# Patient Record
Sex: Female | Born: 1955 | Race: Black or African American | Hispanic: No | Marital: Single | Smoking: Never smoker
Health system: Southern US, Community
[De-identification: ages and names within clinical notes are randomized; demographics above are authoritative.]

## PROBLEM LIST (undated history)

## (undated) DIAGNOSIS — K219 Gastro-esophageal reflux disease without esophagitis: Secondary | ICD-10-CM

## (undated) DIAGNOSIS — F32A Depression, unspecified: Secondary | ICD-10-CM

## (undated) DIAGNOSIS — F329 Major depressive disorder, single episode, unspecified: Secondary | ICD-10-CM

## (undated) DIAGNOSIS — I10 Essential (primary) hypertension: Secondary | ICD-10-CM

## (undated) DIAGNOSIS — Z9889 Other specified postprocedural states: Secondary | ICD-10-CM

## (undated) DIAGNOSIS — M1990 Unspecified osteoarthritis, unspecified site: Secondary | ICD-10-CM

## (undated) DIAGNOSIS — G4730 Sleep apnea, unspecified: Secondary | ICD-10-CM

## (undated) DIAGNOSIS — T7840XA Allergy, unspecified, initial encounter: Secondary | ICD-10-CM

## (undated) DIAGNOSIS — E119 Type 2 diabetes mellitus without complications: Secondary | ICD-10-CM

## (undated) DIAGNOSIS — F419 Anxiety disorder, unspecified: Secondary | ICD-10-CM

## (undated) DIAGNOSIS — G709 Myoneural disorder, unspecified: Secondary | ICD-10-CM

## (undated) DIAGNOSIS — F429 Obsessive-compulsive disorder, unspecified: Secondary | ICD-10-CM

## (undated) DIAGNOSIS — E785 Hyperlipidemia, unspecified: Secondary | ICD-10-CM

## (undated) HISTORY — PX: ORIF ANKLE FRACTURE: SUR919

## (undated) HISTORY — PX: FRACTURE SURGERY: SHX138

## (undated) HISTORY — DX: Major depressive disorder, single episode, unspecified: F32.9

## (undated) HISTORY — DX: Sleep apnea, unspecified: G47.30

## (undated) HISTORY — DX: Myoneural disorder, unspecified: G70.9

## (undated) HISTORY — DX: Allergy, unspecified, initial encounter: T78.40XA

## (undated) HISTORY — DX: Unspecified osteoarthritis, unspecified site: M19.90

## (undated) HISTORY — DX: Hyperlipidemia, unspecified: E78.5

## (undated) HISTORY — DX: Gastro-esophageal reflux disease without esophagitis: K21.9

## (undated) HISTORY — PX: SPINE SURGERY: SHX786

## (undated) HISTORY — DX: Other specified postprocedural states: Z98.890

## (undated) HISTORY — DX: Anxiety disorder, unspecified: F41.9

## (undated) HISTORY — PX: JOINT REPLACEMENT: SHX530

## (undated) HISTORY — DX: Depression, unspecified: F32.A

## (undated) HISTORY — PX: LUMBAR DISC SURGERY: SHX700

---
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
## 2015-06-26 DIAGNOSIS — R1319 Other dysphagia: Secondary | ICD-10-CM

## 2016-06-11 ENCOUNTER — Encounter (HOSPITAL_COMMUNITY): Payer: Self-pay | Admitting: Emergency Medicine

## 2016-06-11 ENCOUNTER — Emergency Department (HOSPITAL_COMMUNITY)
Admission: EM | Admit: 2016-06-11 | Discharge: 2016-06-11 | Disposition: A | Discharge status: Home / Self Care | Payer: Self-pay | Attending: Emergency Medicine | Admitting: Emergency Medicine

## 2016-06-11 DIAGNOSIS — I10 Essential (primary) hypertension: Secondary | ICD-10-CM | POA: Insufficient documentation

## 2016-06-11 DIAGNOSIS — M5441 Lumbago with sciatica, right side: Secondary | ICD-10-CM

## 2016-06-11 HISTORY — DX: Essential (primary) hypertension: I10

## 2016-06-11 MED ORDER — OXYCODONE-ACETAMINOPHEN 5-325 MG PO TABS
1.0000 | ORAL_TABLET | Freq: Once | ORAL | Status: AC
Start: 2016-06-11 — End: 2016-06-11
  Administered 2016-06-11: 1 via ORAL
  Filled 2016-06-11: qty 1

## 2016-06-11 MED ORDER — METHYLPREDNISOLONE 4 MG PO TBPK: ORAL_TABLET | ORAL | 0 refills | Status: DC

## 2016-06-11 MED ORDER — METHOCARBAMOL 500 MG PO TABS: 500.0000 mg | ORAL_TABLET | Freq: Two times a day (BID) | ORAL | 0 refills | Status: DC

## 2016-06-11 NOTE — ED Provider Notes (Signed)
[facility]-EMERGENCY DEPT Provider Note   CSN: [id number] Arrival date & time: 06/11/16  1935     History   Chief Complaint Chief Complaint  Patient presents with  . Leg Pain    HPI Christine Hopkins is a 60 y.o. female.  HPI  60 y.o. female with a hx of HTN, presents to the Emergency Department today complaining of right leg pain intermittently x 2 days. States being on new mattress recently due to moving from [place]. Pain occurs after getting out of bed. Notes pain is intermittent. Worse with ambulation. Improved with rest. States pain is 8/10. No hx IV drug use. No loss of bowel or bladder function. No fevers. Minimal relief of pain with ibuprofen. No trauma to area noted. No falls. No other symptoms noted.    Past Medical History:  Diagnosis Date  . Hypertension     There are no active problems to display for this patient.   No past surgical history on file.  OB History    No data available       Home Medications    Prior to Admission medications   Not on File    Family History No family history on file.  Social History Social History  Substance Use Topics  . Smoking status: Not on file  . Smokeless tobacco: Not on file  . Alcohol use Not on file     Allergies   Patient has no known allergies.   Review of Systems Review of Systems  Constitutional: Negative for fever.  Respiratory: Negative for shortness of breath.   Gastrointestinal: Negative for nausea and vomiting.  Genitourinary: Negative for dysuria.  Musculoskeletal: Positive for back pain.  Skin: Negative for wound.  Neurological: Negative for numbness.   Physical Exam Updated Vital Signs BP 136/87 (BP Location: Left Arm)   Pulse 77   Temp 97.8 F (36.6 C)   Resp 16   SpO2 97%   Physical Exam  Constitutional: She is oriented to person, place, and time. Vital signs are normal. She appears well-developed and well-nourished.  HENT:  Head: Normocephalic and atraumatic.  Right Ear:  Hearing normal.  Left Ear: Hearing normal.  Eyes: Conjunctivae and EOM are normal. Pupils are equal, round, and reactive to light.  Neck: Normal range of motion. Neck supple.  Cardiovascular: Normal rate, regular rhythm, normal heart sounds and intact distal pulses.   Pulmonary/Chest: Effort normal and breath sounds normal.  Musculoskeletal: Normal range of motion.  TTP lumbar spine. No palpable or visible deformities. Positive straight leg raise on right. NVI. Motor/sensation intact.   Neurological: She is alert and oriented to person, place, and time.  Skin: Skin is warm and dry.  Psychiatric: She has a normal mood and affect. Her speech is normal and behavior is normal. Thought content normal.  Nursing note and vitals reviewed.  ED Treatments / Results  Labs (all labs ordered are listed, but only abnormal results are displayed) Labs Reviewed - No data to display  EKG  EKG Interpretation None       Radiology No results found.  Procedures Procedures (including critical care time)  Medications Ordered in ED Medications - No data to display   Initial Impression / Assessment and Plan / ED Course  I have reviewed the triage vital signs and the nursing notes.  Pertinent labs & imaging results that were available during my care of the patient were reviewed by me and considered in my medical decision making (see chart for details).  Clinical  Course    Final Clinical Impressions(s) / ED Diagnoses     {I have reviewed the relevant previous healthcare records.  {I obtained HPI from historian. {Patient discussed with supervising physician.  ED Course:  Assessment: Patient is a 60yF who presents to the ED with back pain x 2 days. Noted sleeping on new mattress. No neurological deficits appreciated. Patient is ambulatory. No warning symptoms of back pain including: fecal incontinence, urinary retention or overflow incontinence, night sweats, waking from sleep with back pain,  unexplained fevers or weight loss, h/o cancer, IVDU, recent trauma. No concern for cauda equina, epidural abscess, or other serious cause of back pain. Likely sciatic pain as pain reproducible on straight leg raise . LUmbar spine TTP. Conservative measures such as rest, ice/heat and pain medicine indicated with PCP follow-up if no improvement with conservative management. Given Rx medrol and Robaxin.   Disposition/Plan:  DC Home Additional Verbal discharge instructions given and discussed with patient.  Pt Instructed to f/u with PCP in the next week for evaluation and treatment of symptoms. Return precautions given Pt acknowledges and agrees with plan  Supervising Physician Lavera Guiseana Duo Liu, MD  Final diagnoses:  Acute right-sided low back pain with right-sided sciatica    New Prescriptions New Prescriptions   No medications on file     Audry Piliyler Kjirsten Bloodgood, PA-C 06/11/16 2042    Lavera Guiseana Duo Liu, MD 06/12/16 [phone]

## 2016-06-11 NOTE — Discharge Instructions (Signed)
Please read and follow all provided instructions.  Your diagnoses today include:  1. Acute right-sided low back pain with right-sided sciatica     Tests performed today include: Vital signs - see below for your results today  Medications prescribed:   Take any prescribed medications only as directed.  Home care instructions:  Follow any educational materials contained in this packet Please rest, use ice or heat on your back for the next several days Do not lift, push, pull anything more than 10 pounds for the next week  Follow-up instructions: Please follow-up with your primary care provider in the next 1 week for further evaluation of your symptoms.   Return instructions:  SEEK IMMEDIATE MEDICAL ATTENTION IF YOU HAVE: New numbness, tingling, weakness, or problem with the use of your arms or legs Severe back pain not relieved with medications Loss control of your bowels or bladder Increasing pain in any areas of the body (such as chest or abdominal pain) Shortness of breath, dizziness, or fainting.  Worsening nausea (feeling sick to your stomach), vomiting, fever, or sweats Any other emergent concerns regarding your health   Additional Information:  Your vital signs today were: BP 136/87 (BP Location: Left Arm)    Pulse 77    Temp 97.8 F (36.6 C)    Resp 16    SpO2 97%  If your blood pressure (BP) was elevated above 135/85 this visit, please have this repeated by your doctor within one month. --------------

## 2016-06-11 NOTE — ED Triage Notes (Signed)
Per EMS, patient reports intermittent right thigh pain x2 days. Ambulatory with EMS. Pain radiates to right hip and worsens with movement.

## 2016-06-20 ENCOUNTER — Emergency Department (HOSPITAL_COMMUNITY): Payer: BLUE CROSS/BLUE SHIELD

## 2016-06-20 ENCOUNTER — Encounter (HOSPITAL_COMMUNITY): Payer: Self-pay

## 2016-06-20 ENCOUNTER — Emergency Department (HOSPITAL_COMMUNITY)
Admission: EM | Admit: 2016-06-20 | Discharge: 2016-06-21 | Disposition: A | Discharge status: Home / Self Care | Payer: BLUE CROSS/BLUE SHIELD | Attending: Emergency Medicine | Admitting: Emergency Medicine

## 2016-06-20 DIAGNOSIS — M549 Dorsalgia, unspecified: Secondary | ICD-10-CM | POA: Diagnosis present

## 2016-06-20 DIAGNOSIS — M5441 Lumbago with sciatica, right side: Secondary | ICD-10-CM | POA: Diagnosis not present

## 2016-06-20 DIAGNOSIS — I10 Essential (primary) hypertension: Secondary | ICD-10-CM | POA: Insufficient documentation

## 2016-06-20 MED ORDER — HYDROCODONE-ACETAMINOPHEN 5-325 MG PO TABS
1.0000 | ORAL_TABLET | Freq: Once | ORAL | Status: AC
  Administered 2016-06-20: 1 via ORAL
  Filled 2016-06-20: qty 1

## 2016-06-20 MED ORDER — IBUPROFEN 800 MG PO TABS: 800.0000 mg | ORAL_TABLET | Freq: Three times a day (TID) | ORAL | 0 refills | Status: DC

## 2016-06-20 MED ORDER — HYDROCODONE-ACETAMINOPHEN 5-325 MG PO TABS: 1.0000 | ORAL_TABLET | Freq: Four times a day (QID) | ORAL | 0 refills | Status: DC | PRN

## 2016-06-20 NOTE — ED Notes (Signed)
Pt getting disc copies of XR images.

## 2016-06-20 NOTE — Discharge Instructions (Signed)
Take anti-inflammatory (Ibuprofen and Steroid) medicine for the next week. Take this medicine with food. Take muscle relaxer at bedtime to help you sleep. This medicine makes you drowsy so do not take before driving or work Take pain medicine as needed for severe pain Use a heating pad for sore muscles - use for 20 minutes several times a day

## 2016-06-20 NOTE — ED Notes (Addendum)
Pt ambulated to XR.

## 2016-06-20 NOTE — ED Triage Notes (Signed)
Pt continues to complains of back pain, she was dx with sciatica about two weeks ago and she's out of her medications that she was given Pt has an appt with an Orthopedic on Thursday
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
## 2016-06-23 ENCOUNTER — Other Ambulatory Visit: Payer: Self-pay | Admitting: Orthopedic Surgery

## 2016-06-23 DIAGNOSIS — M545 Low back pain: Secondary | ICD-10-CM

## 2016-06-30 ENCOUNTER — Ambulatory Visit
Admission: RE | Admit: 2016-06-30 | Discharge: 2016-06-30 | Disposition: A | Discharge status: Home / Self Care | Payer: BLUE CROSS/BLUE SHIELD | Source: Ambulatory Visit | Attending: Orthopedic Surgery | Admitting: Orthopedic Surgery

## 2016-06-30 DIAGNOSIS — M545 Low back pain: Secondary | ICD-10-CM

## 2016-07-04 ENCOUNTER — Other Ambulatory Visit: Payer: Self-pay

## 2016-07-13 ENCOUNTER — Telehealth (HOSPITAL_COMMUNITY): Payer: Self-pay

## 2016-07-13 NOTE — Telephone Encounter (Signed)
Attempted to call pt back / L/M on v/mail to call [phone]

## 2016-07-15 ENCOUNTER — Ambulatory Visit (INDEPENDENT_AMBULATORY_CARE_PROVIDER_SITE_OTHER): Payer: BLUE CROSS/BLUE SHIELD | Admitting: Family Medicine

## 2016-07-15 VITALS — BP 138/78 | Temp 98.4°F | Ht 64.0 in | Wt 210.6 lb

## 2016-07-15 DIAGNOSIS — F4321 Adjustment disorder with depressed mood: Secondary | ICD-10-CM | POA: Diagnosis not present

## 2016-07-15 DIAGNOSIS — E6609 Other obesity due to excess calories: Secondary | ICD-10-CM

## 2016-07-15 DIAGNOSIS — E785 Hyperlipidemia, unspecified: Secondary | ICD-10-CM | POA: Insufficient documentation

## 2016-07-15 DIAGNOSIS — Z6836 Body mass index (BMI) 36.0-36.9, adult: Secondary | ICD-10-CM

## 2016-07-15 DIAGNOSIS — M5126 Other intervertebral disc displacement, lumbar region: Secondary | ICD-10-CM

## 2016-07-15 DIAGNOSIS — I10 Essential (primary) hypertension: Secondary | ICD-10-CM

## 2016-07-15 DIAGNOSIS — IMO0001 Reserved for inherently not codable concepts without codable children: Secondary | ICD-10-CM

## 2016-07-15 LAB — POCT URINALYSIS DIP (MANUAL ENTRY)
BILIRUBIN UA: NEGATIVE
Bilirubin, UA: NEGATIVE
GLUCOSE UA: NEGATIVE
Leukocytes, UA: NEGATIVE
Nitrite, UA: NEGATIVE
Protein Ur, POC: NEGATIVE
SPEC GRAV UA: 1.015
Urobilinogen, UA: 0.2
pH, UA: 5.5

## 2016-07-15 LAB — POC MICROSCOPIC URINALYSIS (UMFC): MUCUS RE: ABSENT

## 2016-07-15 MED ORDER — SIMVASTATIN 40 MG PO TABS: 40.0000 mg | ORAL_TABLET | Freq: Every day | ORAL | 3 refills | Status: DC

## 2016-07-15 MED ORDER — SERTRALINE HCL 100 MG PO TABS: 100.0000 mg | ORAL_TABLET | Freq: Every day | ORAL | 3 refills | Status: DC

## 2016-07-15 MED ORDER — LISINOPRIL-HYDROCHLOROTHIAZIDE 10-12.5 MG PO TABS: 1.0000 | ORAL_TABLET | Freq: Every day | ORAL | 3 refills | Status: DC

## 2016-07-15 NOTE — Patient Instructions (Addendum)
IF you received an x-ray today, you will receive an invoice from [facility]. Please contact [facility] at [phone] with questions or concerns regarding your invoice.   IF you received labwork today, you will receive an invoice from [facility]. Please contact [facility] at [phone] with questions or concerns regarding your invoice.   Our billing staff will not be able to assist you with questions regarding bills from these companies.  You will be contacted with the lab results as soon as they are available. The fastest way to get your results is to activate your My Chart account. Instructions are located on the last page of this paperwork. If you have not heard from us regarding the results in 2 weeks, please contact this office.    Degenerative Disk Disease Introduction Degenerative disk disease is a condition caused by the changes that occur in spinal disks as you grow older. Spinal disks are soft and compressible disks located between the bones of your spine (vertebrae). These disks act like shock absorbers. Degenerative disk disease can affect the whole spine. However, the neck and lower back are most commonly affected. Many changes can occur in the spinal disks with aging, such as:  The spinal disks may dry and shrink.  Small tears may occur in the tough, outer covering of the disk (annulus).  The disk space may become smaller due to loss of water.  Abnormal growths in the bone (spurs) may occur. This can put pressure on the nerve roots exiting the spinal canal, causing pain.  The spinal canal may become narrowed. What increases the risk?  Being overweight.  Having a family history of degenerative disk disease.  Smoking.  There is increased risk if you are doing heavy lifting or have a sudden injury. What are the signs or symptoms? Symptoms vary from person to person and may include:  Pain that varies in intensity. Some people have no pain, while  others have severe pain. The location of the pain depends on the part of your backbone that is affected.  You will have neck or arm pain if a disk in the neck area is affected.  You will have pain in your back, buttocks, or legs if a disk in the lower back is affected.  Pain that becomes worse while bending, reaching up, or with twisting movements.  Pain that may start gradually and then get worse as time passes. It may also start after a major or minor injury.  Numbness or tingling in the arms or legs. How is this diagnosed? Your health care provider will ask you about your symptoms and about activities or habits that may cause the pain. He or she may also ask about any injuries, diseases, or treatments you have had. Your health care provider will examine you to check for the range of movement that is possible in the affected area, to check for strength in your extremities, and to check for sensation in the areas of the arms and legs supplied by different nerve roots. You may also have:  An X-ray of the spine.  Other imaging tests, such as MRI. How is this treated? Your health care provider will advise you on the best plan for treatment. Treatment may include:  Medicines.  Rehabilitation exercises. Follow these instructions at home:  Follow proper lifting and walking techniques as advised by your health care provider.  Maintain good posture.  Exercise regularly as advised by your health care provider.  Perform relaxation exercises.  Change your sitting, standing, and sleeping habits as advised by your health care provider.  Change positions frequently.  Lose weight or maintain a healthy weight as advised by your health care provider.  Do not use any tobacco products, including cigarettes, chewing tobacco, or electronic cigarettes. If you need help quitting, ask your health care provider.  Wear supportive footwear.  Take medicines only as directed by your health care  provider. Contact a health care provider if:  Your pain does not go away within 1-4 weeks.  You have significant appetite or weight loss. Get help right away if:  Your pain is severe.  You notice weakness in your arms, hands, or legs.  You begin to lose control of your bladder or bowel movements.  You have fevers or night sweats. This information is not intended to replace advice given to you by your health care provider. Make sure you discuss any questions you have with your health care provider. Document Released: 04/03/2007 Document Revised: 11/12/2015 Document Reviewed: 10/08/2013  2017 Elsevier  Heart Disease Prevention Heart disease is a leading cause of death. There are many things you can do to help prevent heart disease. Be physically active Physical activity is good for your heart. It helps control your blood pressure, cholesterol levels, and weight. Try to be physically active every day. Ask your health care provider what activities are best for you. Be a healthy weight Extra weight can strain your heart and affect your blood pressure and cholesterol levels. Lose weight with diet and exercise if recommended by your health care provider. Eat heart-healthy foods Follow a healthy eating plan as recommended by your health care provider or dietitian. Heart-healthy foods include:  High-fiber foods. These include oat bran, oatmeal, and whole-grain breads and cereals.  Fruits and vegetables. Avoid:  Alcohol.  Fried foods.  Foods high in saturated fat. These include meats, butter, whole dairy products, shortening, and coconut or palm oil.  Salty foods. These include canned food, luncheon meat, salty snacks, and fast food. Keep your cholesterol levels under control Cholesterol is a substance that is used for many important functions. When your cholesterol levels are high, cholesterol can stick to the insides of your blood vessels, making them narrow or clog. This can lead  to chest pain (angina) and a heart attack. Keep your cholesterol levels under control as recommended by your health care provider. Have your cholesterol checked at least once a year. Target cholesterol levels (in mg/dL) for most people are:  Total cholesterol below 200.  LDL cholesterol below 100.  HDL cholesterol above 40 in men and above 50 in women.  Triglycerides below 150. Keep your blood pressure under control Having high blood pressure (hypertension) puts you at risk for stroke and other forms of heart disease. Keep your blood pressure under control as recommended by your health care provider. Ask your health care provider if you need treatment to lower your blood pressure. If you are 41-8 years of age, have your blood pressure checked every 3-5 years. If you are 79 years of age or older, have your blood pressure checked every year. Do not use tobacco products Tobacco smoke can damage your heart and blood vessels. Do not use any tobacco products including cigarettes, chewing tobacco, or electronic cigarettes. If you need help quitting, ask your health care provider. Take medicines as directed Take medicines only as directed by your health care provider. Ask your health care provider whether you should take an aspirin every day. Taking aspirin  can help reduce your risk of heart disease and stroke. Where to find more information: To find out more about heart disease, visit the American Heart Association's website at www.americanheart.org This information is not intended to replace advice given to you by your health care provider. Make sure you discuss any questions you have with your health care provider. Document Released: 01/19/2004 Document Revised: 11/04/2015 Document Reviewed: 07/31/2013 Elsevier Interactive Patient Education  2017 [employer].

## 2016-07-15 NOTE — Progress Notes (Signed)
Chief Complaint  Patient presents with  . Establish Care    HPI   Pt reports that she is here to establish care from [place].  She would like refills for her chronic meds  Hypertension: Patient here for follow-up of elevated blood pressure. She is exercising and is adherent to low salt diet.  Blood pressure is well controlled at home. Cardiac symptoms none. Patient denies chest pain, chest pressure/discomfort, claudication, exertional chest pressure/discomfort, irregular heart beat and lower extremity edema.  Cardiovascular risk factors: dyslipidemia, hypertension and obesity (BMI >= 30 kg/m2). Use of agents associated with hypertension: NSAIDS. History of target organ damage: none. BP Readings from Last 3 Encounters:  07/15/16 138/78  06/20/16 157/87  06/11/16 136/87   Ruptured Lumbar Disc She is being set up for dissectomy She reports that she will not need surgical clearance from the PCP She reports that she has chronic back pain and leg numbness since 06/09/16 She was seen in the ER for this and followed up with Orthopedics and is not going to a Neurosurgeon.  Hyperlipidemia: Patient presents with hyperlipidemia.  She was tested because OF SCREENING. negative. There is not a family history of hyperlipidemia. There is not a family history of early ischemia heart disease.  She takes simvastatin 40mg  daily.   Depression Pt reports that she has a history of depression that is aggravated by her back pain. She states that she plans to see a Psychologist, forensicsychologist and Psychiatrist.  She has her appointment prior to her surgery.    Past Medical History:  Diagnosis Date  . Anxiety   . Arthritis   . Depression   . GERD (gastroesophageal reflux disease)   . Hyperlipidemia   . Hypertension   . Neuromuscular disorder [facility](HCC)     Current Outpatient Prescriptions  Medication Sig Dispense Refill  . HYDROcodone-acetaminophen (NORCO/VICODIN) 5-325 MG tablet Take 1 tablet by mouth every 6 (six)  hours as needed. 10 tablet 0  . ibuprofen (ADVIL,MOTRIN) 800 MG tablet Take 1 tablet (800 mg total) by mouth 3 (three) times daily. 30 tablet 0  . lisinopril-hydrochlorothiazide (PRINZIDE,ZESTORETIC) 10-12.5 MG tablet Take 1 tablet by mouth daily. 90 tablet 3  . methocarbamol (ROBAXIN) 500 MG tablet Take 1 tablet (500 mg total) by mouth 2 (two) times daily. 20 tablet 0  . methylPREDNISolone (MEDROL DOSEPAK) 4 MG TBPK tablet Take as prescribed on box 21 tablet 0  . sertraline (ZOLOFT) 100 MG tablet Take 1 tablet (100 mg total) by mouth daily. 90 tablet 3  . simvastatin (ZOCOR) 40 MG tablet Take 1 tablet (40 mg total) by mouth daily. 90 tablet 3   No current facility-administered medications for this visit.     Allergies: No Known Allergies  Past Surgical History:  Procedure Laterality Date  . FRACTURE SURGERY    . JOINT REPLACEMENT    . SPINE SURGERY      Social History   Social History  . Marital status: Single    Spouse name: N/A  . Number of children: N/A  . Years of education: N/A   Social History Main Topics  . Smoking status: Never Smoker  . Smokeless tobacco: Never Used  . Alcohol use No  . Drug use: No  . Sexual activity: Not Asked   Other Topics Concern  . None   Social History Narrative  . None    Review of Systems  Constitutional: Negative for chills, fever and weight loss.  Eyes: Negative for blurred vision and double vision.  Respiratory:  Negative for cough, shortness of breath and wheezing.   Gastrointestinal: Negative for abdominal pain, nausea and vomiting.  Genitourinary: Negative for dysuria, frequency and urgency.  Musculoskeletal: Positive for back pain and joint pain. Negative for falls and myalgias.  Skin: Negative for itching and rash.  Neurological: Negative for dizziness, tremors and headaches.  Psychiatric/Behavioral: Positive for depression. Negative for suicidal ideas.    Objective: Vitals:   07/15/16 1436  BP: 138/78  Temp: 98.4 F  (36.9 C)  TempSrc: Oral  Weight: 210 lb 9.6 oz (95.5 kg)  Height: 5\' 4"  (1.626 m)   Body mass index is 36.15 kg/m.  Physical Exam  Constitutional: She is oriented to person, place, and time. She appears well-developed and well-nourished.  HENT:  Head: Normocephalic and atraumatic.  Right Ear: External ear normal.  Left Ear: External ear normal.  Nose: Nose normal.  Mouth/Throat: Oropharynx is clear and moist.  Eyes: Conjunctivae and EOM are normal.  Cardiovascular: Normal rate, regular rhythm and normal heart sounds.   No murmur heard. Pulmonary/Chest: Effort normal and breath sounds normal. No respiratory distress. She has no wheezes.  Musculoskeletal: She exhibits tenderness. She exhibits no edema.  Neurological: She is alert and oriented to person, place, and time. No cranial nerve deficit.  Skin: Skin is warm. Capillary refill takes less than 2 seconds. No erythema.  Psychiatric: She has a normal mood and affect. Her behavior is normal. Judgment and thought content normal.    Assessment and Plan Blanchie was seen today for establish care.  Diagnoses and all orders for this visit:  Essential hypertension- well controlled, DASH diet, continue current meds -     Microalbumin, urine -     POCT urinalysis dipstick -     POCT Microscopic Urinalysis ([facility]) -     Comprehensive metabolic panel  Dyslipidemia- will check levels today  Refilled statin -     Lipid panel -     Comprehensive metabolic panel -     simvastatin (ZOCOR) 40 MG tablet; Take 1 tablet (40 mg total) by mouth daily.  Class 2 obesity due to excess calories with serious comorbidity and body mass index (BMI) of 36.0 to 36.9 in adult  Adjustment disorder with depressed mood- continue SSRI Will screen for anemia which can worsen mood -     CBC -     sertraline (ZOLOFT) 100 MG tablet; Take 1 tablet (100 mg total) by mouth daily.  Ruptured lumbar disc- follow Neurosurgery recs Tentative surgery planned  Other  orders -     lisinopril-hydrochlorothiazide (PRINZIDE,ZESTORETIC) 10-12.5 MG tablet; Take 1 tablet by mouth daily.     Exavier Lina A Galen Malkowski

## 2016-07-16 LAB — COMPREHENSIVE METABOLIC PANEL
A/G RATIO: 1.7 (ref 1.2–2.2)
ALBUMIN: 4.4 g/dL (ref 3.6–4.8)
ALT: 16 IU/L (ref 0–32)
AST: 15 IU/L (ref 0–40)
Alkaline Phosphatase: 85 IU/L (ref 39–117)
BUN/Creatinine Ratio: 19 (ref 12–28)
BUN: 17 mg/dL (ref 8–27)
CALCIUM: 9.8 mg/dL (ref 8.7–10.3)
CHLORIDE: 102 mmol/L (ref 96–106)
CO2: 28 mmol/L (ref 18–29)
Creatinine, Ser: 0.9 mg/dL (ref 0.57–1.00)
GFR calc Af Amer: 80 mL/min/{1.73_m2} (ref >59)
GFR calc non Af Amer: 70 mL/min/{1.73_m2} (ref >59)
GLOBULIN, TOTAL: 2.6 g/dL (ref 1.5–4.5)
GLUCOSE: 77 mg/dL (ref 65–99)
POTASSIUM: 3.9 mmol/L (ref 3.5–5.2)
SODIUM: 146 mmol/L — AB (ref 134–144)
Total Protein: 7 g/dL (ref 6.0–8.5)

## 2016-07-16 LAB — CBC
Hematocrit: 39.1 % (ref 34.0–46.6)
Hemoglobin: 13.2 g/dL (ref 11.1–15.9)
MCH: 27.7 pg (ref 26.6–33.0)
MCHC: 33.8 g/dL (ref 31.5–35.7)
MCV: 82 fL (ref 79–97)
PLATELETS: 411 10*3/uL — AB (ref 150–379)
RBC: 4.77 x10E6/uL (ref 3.77–5.28)
RDW: 15.4 % (ref 12.3–15.4)
WBC: 10 10*3/uL (ref 3.4–10.8)

## 2016-07-16 LAB — LIPID PANEL
CHOL/HDL RATIO: 3.5 ratio (ref 0.0–4.4)
Cholesterol, Total: 205 mg/dL — ABNORMAL HIGH (ref 100–199)
HDL: 59 mg/dL (ref >39)
LDL CALC: 130 mg/dL — AB (ref 0–99)
Triglycerides: 81 mg/dL (ref 0–149)
VLDL CHOLESTEROL CAL: 16 mg/dL (ref 5–40)

## 2016-07-16 LAB — MICROALBUMIN, URINE: MICROALBUM., U, RANDOM: 5.7 ug/mL

## 2016-07-26 ENCOUNTER — Ambulatory Visit: Payer: Self-pay | Admitting: Psychiatry

## 2016-08-02 ENCOUNTER — Encounter (HOSPITAL_COMMUNITY): Payer: Self-pay | Admitting: Psychiatry

## 2016-08-02 ENCOUNTER — Ambulatory Visit (INDEPENDENT_AMBULATORY_CARE_PROVIDER_SITE_OTHER): Payer: BLUE CROSS/BLUE SHIELD | Admitting: Psychiatry

## 2016-08-02 VITALS — BP 116/74 | HR 73 | Ht 64.25 in | Wt 215.8 lb

## 2016-08-02 DIAGNOSIS — Z9889 Other specified postprocedural states: Secondary | ICD-10-CM

## 2016-08-02 DIAGNOSIS — Z888 Allergy status to other drugs, medicaments and biological substances status: Secondary | ICD-10-CM | POA: Diagnosis not present

## 2016-08-02 DIAGNOSIS — Z833 Family history of diabetes mellitus: Secondary | ICD-10-CM | POA: Diagnosis not present

## 2016-08-02 DIAGNOSIS — Z8249 Family history of ischemic heart disease and other diseases of the circulatory system: Secondary | ICD-10-CM

## 2016-08-02 DIAGNOSIS — Z8489 Family history of other specified conditions: Secondary | ICD-10-CM

## 2016-08-02 DIAGNOSIS — F330 Major depressive disorder, recurrent, mild: Secondary | ICD-10-CM

## 2016-08-02 DIAGNOSIS — Z818 Family history of other mental and behavioral disorders: Secondary | ICD-10-CM | POA: Diagnosis not present

## 2016-08-02 DIAGNOSIS — Z79899 Other long term (current) drug therapy: Secondary | ICD-10-CM | POA: Diagnosis not present

## 2016-08-02 MED ORDER — OLANZAPINE 2.5 MG PO TABS: 2.5000 mg | ORAL_TABLET | Freq: Every day | ORAL | 1 refills | Status: DC
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
## 2016-09-12 ENCOUNTER — Ambulatory Visit (INDEPENDENT_AMBULATORY_CARE_PROVIDER_SITE_OTHER): Payer: BLUE CROSS/BLUE SHIELD | Admitting: Psychiatry

## 2016-09-12 ENCOUNTER — Encounter (HOSPITAL_COMMUNITY): Payer: Self-pay | Admitting: Psychiatry

## 2016-09-12 DIAGNOSIS — Z888 Allergy status to other drugs, medicaments and biological substances status: Secondary | ICD-10-CM | POA: Diagnosis not present

## 2016-09-12 DIAGNOSIS — Z79899 Other long term (current) drug therapy: Secondary | ICD-10-CM

## 2016-09-12 DIAGNOSIS — F330 Major depressive disorder, recurrent, mild: Secondary | ICD-10-CM

## 2016-09-12 DIAGNOSIS — F331 Major depressive disorder, recurrent, moderate: Secondary | ICD-10-CM | POA: Diagnosis not present

## 2016-09-12 DIAGNOSIS — Z818 Family history of other mental and behavioral disorders: Secondary | ICD-10-CM

## 2016-09-12 MED ORDER — OLANZAPINE 5 MG PO TABS: 5.0000 mg | ORAL_TABLET | Freq: Every day | ORAL | 0 refills | Status: DC
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
## 2016-09-19 ENCOUNTER — Ambulatory Visit (HOSPITAL_COMMUNITY): Payer: Self-pay | Admitting: Psychiatry

## 2016-09-26 ENCOUNTER — Ambulatory Visit (INDEPENDENT_AMBULATORY_CARE_PROVIDER_SITE_OTHER): Payer: BLUE CROSS/BLUE SHIELD | Admitting: Psychiatry

## 2016-09-26 DIAGNOSIS — F330 Major depressive disorder, recurrent, mild: Secondary | ICD-10-CM
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
## 2016-10-04 ENCOUNTER — Other Ambulatory Visit (HOSPITAL_COMMUNITY): Payer: BLUE CROSS/BLUE SHIELD

## 2016-10-05 ENCOUNTER — Other Ambulatory Visit (HOSPITAL_COMMUNITY): Payer: BLUE CROSS/BLUE SHIELD

## 2016-10-06 ENCOUNTER — Other Ambulatory Visit (HOSPITAL_COMMUNITY): Payer: BLUE CROSS/BLUE SHIELD

## 2016-10-07 ENCOUNTER — Other Ambulatory Visit (HOSPITAL_COMMUNITY): Payer: BLUE CROSS/BLUE SHIELD

## 2016-10-10 ENCOUNTER — Other Ambulatory Visit (HOSPITAL_COMMUNITY): Payer: BLUE CROSS/BLUE SHIELD

## 2016-10-11 ENCOUNTER — Other Ambulatory Visit (HOSPITAL_COMMUNITY): Payer: BLUE CROSS/BLUE SHIELD

## 2016-10-12 ENCOUNTER — Other Ambulatory Visit (HOSPITAL_COMMUNITY): Payer: BLUE CROSS/BLUE SHIELD

## 2016-10-13 ENCOUNTER — Other Ambulatory Visit (HOSPITAL_COMMUNITY): Payer: BLUE CROSS/BLUE SHIELD

## 2016-10-14 ENCOUNTER — Other Ambulatory Visit (HOSPITAL_COMMUNITY): Payer: BLUE CROSS/BLUE SHIELD

## 2016-10-14 ENCOUNTER — Other Ambulatory Visit (HOSPITAL_COMMUNITY): Payer: Self-pay | Admitting: Psychiatry

## 2016-10-14 DIAGNOSIS — F330 Major depressive disorder, recurrent, mild: Secondary | ICD-10-CM

## 2016-10-17 ENCOUNTER — Other Ambulatory Visit (HOSPITAL_COMMUNITY): Payer: BLUE CROSS/BLUE SHIELD

## 2016-10-18 ENCOUNTER — Other Ambulatory Visit (HOSPITAL_COMMUNITY): Payer: BLUE CROSS/BLUE SHIELD

## 2016-10-19 ENCOUNTER — Other Ambulatory Visit (HOSPITAL_COMMUNITY): Payer: BLUE CROSS/BLUE SHIELD

## 2016-10-20 ENCOUNTER — Other Ambulatory Visit (HOSPITAL_COMMUNITY): Payer: BLUE CROSS/BLUE SHIELD

## 2016-10-21 ENCOUNTER — Other Ambulatory Visit (HOSPITAL_COMMUNITY): Payer: BLUE CROSS/BLUE SHIELD

## 2016-10-24 ENCOUNTER — Other Ambulatory Visit (HOSPITAL_COMMUNITY): Payer: BLUE CROSS/BLUE SHIELD

## 2016-10-25 ENCOUNTER — Other Ambulatory Visit (HOSPITAL_COMMUNITY): Payer: BLUE CROSS/BLUE SHIELD

## 2016-10-26 ENCOUNTER — Other Ambulatory Visit (HOSPITAL_COMMUNITY): Payer: BLUE CROSS/BLUE SHIELD

## 2016-10-27 ENCOUNTER — Other Ambulatory Visit (HOSPITAL_COMMUNITY): Payer: BLUE CROSS/BLUE SHIELD

## 2016-10-28 ENCOUNTER — Other Ambulatory Visit (HOSPITAL_COMMUNITY): Payer: BLUE CROSS/BLUE SHIELD

## 2016-10-31 ENCOUNTER — Other Ambulatory Visit (HOSPITAL_COMMUNITY): Payer: BLUE CROSS/BLUE SHIELD

## 2016-11-01 ENCOUNTER — Other Ambulatory Visit (HOSPITAL_COMMUNITY): Payer: BLUE CROSS/BLUE SHIELD

## 2016-11-02 ENCOUNTER — Other Ambulatory Visit (HOSPITAL_COMMUNITY): Payer: BLUE CROSS/BLUE SHIELD

## 2016-11-03 ENCOUNTER — Other Ambulatory Visit (HOSPITAL_COMMUNITY): Payer: BLUE CROSS/BLUE SHIELD

## 2016-11-04 ENCOUNTER — Other Ambulatory Visit (HOSPITAL_COMMUNITY): Payer: BLUE CROSS/BLUE SHIELD

## 2016-11-07 ENCOUNTER — Other Ambulatory Visit (HOSPITAL_COMMUNITY): Payer: BLUE CROSS/BLUE SHIELD

## 2016-11-08 ENCOUNTER — Other Ambulatory Visit (HOSPITAL_COMMUNITY): Payer: BLUE CROSS/BLUE SHIELD

## 2016-11-09 ENCOUNTER — Other Ambulatory Visit (HOSPITAL_COMMUNITY): Payer: BLUE CROSS/BLUE SHIELD

## 2016-11-10 ENCOUNTER — Encounter (HOSPITAL_COMMUNITY): Payer: Self-pay | Admitting: Psychiatry

## 2016-11-10 ENCOUNTER — Other Ambulatory Visit (HOSPITAL_COMMUNITY): Payer: BLUE CROSS/BLUE SHIELD

## 2016-11-10 ENCOUNTER — Other Ambulatory Visit (HOSPITAL_COMMUNITY): Payer: Self-pay | Admitting: Psychiatry

## 2016-11-10 ENCOUNTER — Other Ambulatory Visit (HOSPITAL_COMMUNITY): Payer: Self-pay

## 2016-11-10 ENCOUNTER — Ambulatory Visit (INDEPENDENT_AMBULATORY_CARE_PROVIDER_SITE_OTHER): Payer: BLUE CROSS/BLUE SHIELD | Admitting: Psychiatry

## 2016-11-10 VITALS — BP 130/74 | HR 76 | Ht 64.0 in | Wt 218.0 lb

## 2016-11-10 DIAGNOSIS — F330 Major depressive disorder, recurrent, mild: Secondary | ICD-10-CM

## 2016-11-10 DIAGNOSIS — Z9889 Other specified postprocedural states: Secondary | ICD-10-CM | POA: Diagnosis not present

## 2016-11-10 DIAGNOSIS — Z818 Family history of other mental and behavioral disorders: Secondary | ICD-10-CM

## 2016-11-10 DIAGNOSIS — Z79899 Other long term (current) drug therapy: Secondary | ICD-10-CM

## 2016-11-10 MED ORDER — OLANZAPINE 5 MG PO TABS: 5.0000 mg | ORAL_TABLET | Freq: Every day | ORAL | 0 refills | Status: DC
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
## 2016-11-11 ENCOUNTER — Other Ambulatory Visit (HOSPITAL_COMMUNITY): Payer: BLUE CROSS/BLUE SHIELD

## 2016-11-11 LAB — HEMOGLOBIN A1C
ESTIMATED AVERAGE GLUCOSE: 126 mg/dL
Hgb A1c MFr Bld: 6 % — ABNORMAL HIGH (ref 4.8–5.6)

## 2016-11-11 LAB — PLEASE NOTE

## 2016-11-15 ENCOUNTER — Other Ambulatory Visit (HOSPITAL_COMMUNITY): Payer: BLUE CROSS/BLUE SHIELD

## 2016-11-16 ENCOUNTER — Other Ambulatory Visit (HOSPITAL_COMMUNITY): Payer: BLUE CROSS/BLUE SHIELD

## 2016-11-17 ENCOUNTER — Other Ambulatory Visit (HOSPITAL_COMMUNITY): Payer: BLUE CROSS/BLUE SHIELD

## 2016-11-18 ENCOUNTER — Other Ambulatory Visit (HOSPITAL_COMMUNITY): Payer: BLUE CROSS/BLUE SHIELD

## 2016-11-21 ENCOUNTER — Other Ambulatory Visit (HOSPITAL_COMMUNITY): Payer: BLUE CROSS/BLUE SHIELD

## 2016-11-22 ENCOUNTER — Other Ambulatory Visit (HOSPITAL_COMMUNITY): Payer: BLUE CROSS/BLUE SHIELD

## 2016-11-23 ENCOUNTER — Other Ambulatory Visit (HOSPITAL_COMMUNITY): Payer: BLUE CROSS/BLUE SHIELD

## 2016-11-24 ENCOUNTER — Other Ambulatory Visit (HOSPITAL_COMMUNITY): Payer: BLUE CROSS/BLUE SHIELD

## 2016-11-25 ENCOUNTER — Other Ambulatory Visit (HOSPITAL_COMMUNITY): Payer: BLUE CROSS/BLUE SHIELD

## 2016-11-28 ENCOUNTER — Other Ambulatory Visit (HOSPITAL_COMMUNITY): Payer: BLUE CROSS/BLUE SHIELD

## 2017-02-21 ENCOUNTER — Ambulatory Visit (HOSPITAL_COMMUNITY): Payer: Self-pay | Admitting: Psychiatry

## 2017-07-17 ENCOUNTER — Telehealth: Payer: Self-pay | Admitting: Medical

## 2017-07-17 NOTE — Telephone Encounter (Signed)
Copied from CRM [phone]. Topic: Quick Communication - Rx Refill/Question >> Jul 17, 2017 10:43 AM Anice PaganiniMunoz, Vinod Mikesell I, NT wrote: Medication: Zocor 40 mg ,Zoloft 40 mg,Zyprexa 5 mg   Has the patient contacted their pharmacy yes    (Agent: If no, request that the patient contact the pharmacy for the refill.yes   Preferred Pharmacy (with phone number or street name [facility] [address] [phone]   Agent: Please be advised that RX refills may take up to 3 business days. We ask that you follow-up with your pharmacy.

## 2017-07-18 NOTE — Telephone Encounter (Signed)
Not a patient of [facility] 

## 2017-08-15 ENCOUNTER — Other Ambulatory Visit: Payer: Self-pay | Admitting: Family Medicine

## 2017-08-28 ENCOUNTER — Other Ambulatory Visit: Payer: Self-pay | Admitting: Family Medicine

## 2017-09-01 ENCOUNTER — Telehealth: Payer: Self-pay | Admitting: Family Medicine

## 2017-09-01 NOTE — Telephone Encounter (Signed)
LOV 07/15/16 with Dr. Creta LevinStallings

## 2017-09-01 NOTE — Telephone Encounter (Signed)
Copied from CRM [id number]. Topic: Quick Communication - Rx Refill/Question >> Sep 01, 2017  9:45 AM Caryn SectionMorris, Alani Lacivita E, NT wrote: Medication: lisinopril-hydrochlorothiazide (PRINZIDE,ZESTORETIC) 10-12.5 MG tablet , sertraline (ZOLOFT) 100 MG tablet and simvastatin (ZOCOR) 40 MG tablet   Has the patient contacted their pharmacy? Yes. Patient has an appointment scheduled on 3/27 but will be out of medication by then.    (Agent: If no, request that the patient contact the pharmacy for the refill.) [id number] [place], [place] - [id number] [facility] [address] [facility] [address] [phone] (Phone) [phone])     Preferred Pharmacy (with phone number or street name): ***   Agent: Please be advised that RX refills may take up to 3 business days. We ask that you follow-up with your pharmacy.

## 2017-09-01 NOTE — Telephone Encounter (Signed)
Copied from CRM [id number]. Topic: Quick Communication - Rx Refill/Question >> Sep 01, 2017  9:49 AM Caryn SectionMorris, Otisha Spickler E, NT wrote: Medication: lisinopril-hydrochlorothiazide (PRINZIDE,ZESTORETIC) 10-12.5 MG tablet, sertraline (ZOLOFT) 100 MG tablet and simvastatin (ZOCOR) 40 MG tablet   Has the patient contacted their pharmacy? Yes patient has appointment scheduled for 3/27 but will be out of medication by then     Preferred Pharmacy (with phone number or street name): Agent: If no, request that the patient contact the pharmacy for the refill.) [id number] [place], [place] - [id number] AT [facility] [address] [facility] [address] [phone] ([phone] (Fax)     Agent: Please be advised that RX refills may take up to 3 business days. We ask that you follow-up with your pharmacy.

## 2017-09-01 NOTE — Telephone Encounter (Signed)
Copied from CRM [id number]. Topic: Quick Communication - Rx Refill/Question >> Sep 01, 2017  9:49 AM Caryn SectionMorris, Samiyyah Moffa E, NT wrote: Medication: lisinopril-hydrochlorothiazide (PRINZIDE,ZESTORETIC) 10-12.5 MG tablet, sertraline (ZOLOFT) 100 MG tablet and simvastatin (ZOCOR) 40 MG tablet   Has the patient contacted their pharmacy? Yes patient has appointment scheduled 3/27 but will be out of medication by then.    (Agent: If no, request that the patient contact the pharmacy for the refill.) [facility] [id number] [place], [place] - [address] AT [facility] [address] [facility] [address] [phone] (Phone) [phone] (Fax)     Preferred Pharmacy (with phone number or street name):   Agent: Please be advised that RX refills may take up to 3 business days. We ask that you follow-up with your pharmacy.

## 2017-09-04 ENCOUNTER — Other Ambulatory Visit: Payer: Self-pay | Admitting: *Deleted

## 2017-09-04 MED ORDER — SIMVASTATIN 40 MG PO TABS: 40.0000 mg | ORAL_TABLET | Freq: Every day | ORAL | 0 refills | Status: DC

## 2017-09-04 MED ORDER — LISINOPRIL-HYDROCHLOROTHIAZIDE 10-12.5 MG PO TABS: 1.0000 | ORAL_TABLET | Freq: Every day | ORAL | 0 refills | Status: DC

## 2017-09-13 ENCOUNTER — Encounter: Payer: Self-pay | Admitting: Family Medicine

## 2017-09-13 ENCOUNTER — Ambulatory Visit (INDEPENDENT_AMBULATORY_CARE_PROVIDER_SITE_OTHER): Payer: BLUE CROSS/BLUE SHIELD | Admitting: Family Medicine

## 2017-09-13 VITALS — BP 142/78 | HR 93 | Temp 98.4°F | Resp 17 | Ht 64.0 in | Wt 219.4 lb

## 2017-09-13 DIAGNOSIS — Z1159 Encounter for screening for other viral diseases: Secondary | ICD-10-CM

## 2017-09-13 DIAGNOSIS — Z0000 Encounter for general adult medical examination without abnormal findings: Secondary | ICD-10-CM | POA: Diagnosis not present

## 2017-09-13 DIAGNOSIS — Z1239 Encounter for other screening for malignant neoplasm of breast: Secondary | ICD-10-CM

## 2017-09-13 DIAGNOSIS — Z1211 Encounter for screening for malignant neoplasm of colon: Secondary | ICD-10-CM

## 2017-09-13 DIAGNOSIS — Z124 Encounter for screening for malignant neoplasm of cervix: Secondary | ICD-10-CM

## 2017-09-13 DIAGNOSIS — Z114 Encounter for screening for human immunodeficiency virus [HIV]: Secondary | ICD-10-CM | POA: Diagnosis not present

## 2017-09-13 DIAGNOSIS — Z23 Encounter for immunization: Secondary | ICD-10-CM | POA: Diagnosis not present

## 2017-09-13 DIAGNOSIS — Z1231 Encounter for screening mammogram for malignant neoplasm of breast: Secondary | ICD-10-CM

## 2017-09-13 DIAGNOSIS — I10 Essential (primary) hypertension: Secondary | ICD-10-CM

## 2017-09-13 DIAGNOSIS — Z1151 Encounter for screening for human papillomavirus (HPV): Secondary | ICD-10-CM | POA: Diagnosis not present

## 2017-09-13 DIAGNOSIS — E785 Hyperlipidemia, unspecified: Secondary | ICD-10-CM | POA: Diagnosis not present

## 2017-09-13 MED ORDER — IBUPROFEN 800 MG PO TABS: 800.0000 mg | ORAL_TABLET | Freq: Three times a day (TID) | ORAL | 0 refills | Status: DC

## 2017-09-13 MED ORDER — LISINOPRIL-HYDROCHLOROTHIAZIDE 10-12.5 MG PO TABS: 1.0000 | ORAL_TABLET | Freq: Every day | ORAL | 1 refills | Status: DC

## 2017-09-13 MED ORDER — SERTRALINE HCL 100 MG PO TABS: 100.0000 mg | ORAL_TABLET | Freq: Every day | ORAL | 3 refills | Status: DC

## 2017-09-13 MED ORDER — SIMVASTATIN 40 MG PO TABS: 40.0000 mg | ORAL_TABLET | Freq: Every day | ORAL | 3 refills | Status: DC

## 2017-09-13 NOTE — Progress Notes (Signed)
Chief Complaint  Patient presents with  . Annual Exam    Subjective:  Christine Hopkins is a 62 y.o. female here for a health maintenance visit.  Patient is established pt  Patient Active Problem List   Diagnosis Date Noted  . History of back surgery 11/10/2016  . Essential hypertension 07/15/2016  . Dyslipidemia 07/15/2016  . Class 2 obesity due to excess calories with serious comorbidity and body mass index (BMI) of 36.0 to 36.9 in adult 07/15/2016  . Adjustment disorder with depressed mood 07/15/2016    Past Medical History:  Diagnosis Date  . Anxiety   . Arthritis   . Depression   . GERD (gastroesophageal reflux disease)   . History of back surgery   . Hyperlipidemia   . Hypertension   . Neuromuscular disorder [facility])     Past Surgical History:  Procedure Laterality Date  . FRACTURE SURGERY    . JOINT REPLACEMENT    . SPINE SURGERY       Outpatient Medications Prior to Visit  Medication Sig Dispense Refill  . HYDROcodone-acetaminophen (NORCO/VICODIN) 5-325 MG tablet Take 1 tablet by mouth every 6 (six) hours as needed. 10 tablet 0  . ibuprofen (ADVIL,MOTRIN) 800 MG tablet Take 1 tablet (800 mg total) by mouth 3 (three) times daily. 30 tablet 0  . lisinopril-hydrochlorothiazide (PRINZIDE,ZESTORETIC) 10-12.5 MG tablet Take 1 tablet by mouth daily. 90 tablet 0  . sertraline (ZOLOFT) 100 MG tablet Take 1 tablet (100 mg total) by mouth daily. 90 tablet 3  . simvastatin (ZOCOR) 40 MG tablet Take 1 tablet (40 mg total) by mouth daily. 90 tablet 0  . methocarbamol (ROBAXIN) 500 MG tablet Take 1 tablet (500 mg total) by mouth 2 (two) times daily. (Patient not taking: Reported on 11/10/2016) 20 tablet 0  . OLANZapine (ZYPREXA) 5 MG tablet Take 1 tablet (5 mg total) by mouth at bedtime. 90 tablet 0   No facility-administered medications prior to visit.     Allergies  Allergen Reactions  . Prozac [Fluoxetine Hcl] Hives     Family History  Problem Relation Age of Onset  .  Depression Father   . Mental illness Sister   . Depression Sister   . Schizophrenia Sister   . Diabetes Brother   . Hyperlipidemia Brother   . Mental illness Brother   . Depression Brother   . Schizophrenia Brother   . Heart disease Brother   . Diabetes Brother   . Diabetes Maternal Grandmother   . Heart disease Maternal Grandfather   . Heart disease Paternal Grandmother      Health Habits: Dental Exam: up to date Eye Exam: up to date Exercise:  times/week on average Current exercise activities: walking/running Diet: balanced  Social History   Socioeconomic History  . Marital status: Single    Spouse name: Not on file  . Number of children: Not on file  . Years of education: Not on file  . Highest education level: Not on file  Occupational History  . Not on file  Social Needs  . Financial resource strain: Not on file  . Food insecurity:    Worry: Not on file    Inability: Not on file  . Transportation needs:    Medical: Not on file    Non-medical: Not on file  Tobacco Use  . Smoking status: Never Smoker  . Smokeless tobacco: Never Used  Substance and Sexual Activity  . Alcohol use: No  . Drug use: No  . Sexual activity:  Not Currently  Lifestyle  . Physical activity:    Days per week: Not on file    Minutes per session: Not on file  . Stress: Not on file  Relationships  . Social connections:    Talks on phone: Not on file    Gets together: Not on file    Attends religious service: Not on file    Active member of club or organization: Not on file    Attends meetings of clubs or organizations: Not on file    Relationship status: Not on file  . Intimate partner violence:    Fear of current or ex partner: Not on file    Emotionally abused: Not on file    Physically abused: Not on file    Forced sexual activity: Not on file  Other Topics Concern  . Not on file  Social History Narrative  . Not on file   Social History   Substance and Sexual Activity   Alcohol Use No   Social History   Tobacco Use  Smoking Status Never Smoker  Smokeless Tobacco Never Used   Social History   Substance and Sexual Activity  Drug Use No    GYN: Sexual Health Menstrual status: regular menses LMP: No LMP recorded. Patient is postmenopausal. Last pap smear: see HM section History of abnormal pap smears:  Sexually active: with female partner (not currently) Current contraception: none  Health Maintenance: See under health Maintenance activity for review of completion dates as well. Immunization History  Administered Date(s) Administered  . Influenza-Unspecified 03/08/2016  . Tdap 09/13/2017      Depression Screen-PHQ2/9 Depression screen [facility] 2/9 09/13/2017 09/13/2017 07/15/2016  Decreased Interest 0 0 0  Down, Depressed, Hopeless 0 0 1  PHQ - 2 Score 0 0 1       Depression Severity and Treatment Recommendations:  0-4= None  5-9= Mild / Treatment: Support, educate to call if worse; return in one month  10-14= Moderate / Treatment: Support, watchful waiting; Antidepressant or Psycotherapy  15-19= Moderately severe / Treatment: Antidepressant OR Psychotherapy  >= 20 = Major depression, severe / Antidepressant AND Psychotherapy    Review of Systems   Review of Systems  Constitutional: Negative for chills, fever and weight loss.  HENT: Negative for ear pain, hearing loss and tinnitus.   Respiratory: Negative for cough, shortness of breath and wheezing.   Cardiovascular: Negative for chest pain, palpitations, orthopnea and leg swelling.  Gastrointestinal: Negative for abdominal pain, nausea and vomiting.  Genitourinary: Negative for dysuria and urgency.  Neurological: Negative for dizziness, tingling, tremors and headaches.  Psychiatric/Behavioral: Negative for depression and hallucinations. The patient is not nervous/anxious.     See HPI for ROS as well.    Objective:   Vitals:   09/13/17 1542  BP: (!) 142/78  Pulse: 93   Resp: 17  Temp: 98.4 F (36.9 C)  TempSrc: Oral  SpO2: 97%  Weight: 219 lb 6.4 oz (99.5 kg)  Height: 5\' 4"  (1.626 m)   BP Readings from Last 3 Encounters:  09/13/17 (!) 142/78  07/15/16 138/78  06/20/16 157/87     Body mass index is 37.66 kg/m.  Physical Exam  Constitutional: She is oriented to person, place, and time. She appears well-developed and well-nourished.  HENT:  Head: Normocephalic and atraumatic.  Right Ear: External ear normal.  Left Ear: External ear normal.  Eyes: Conjunctivae and EOM are normal.  Neck: Normal range of motion. No thyromegaly present.  Cardiovascular: Normal rate, regular  rhythm and normal heart sounds.  No murmur heard. Pulmonary/Chest: Effort normal and breath sounds normal. No respiratory distress. She has no wheezes. She has no rales.  Abdominal: Soft. Bowel sounds are normal. She exhibits no distension. There is no tenderness. There is no rebound.  Musculoskeletal: Normal range of motion. She exhibits no edema.  Neurological: She is alert and oriented to person, place, and time. She has normal reflexes.  Skin: Skin is warm. No erythema.  Psychiatric: She has a normal mood and affect. Her behavior is normal. Judgment and thought content normal.   Chaperone present Breasts: normal appearance, no masses or tenderness, Inspection negative, No nipple retraction or dimpling Pelvic: cervix normal in appearance, external genitalia normal, no adnexal masses or tenderness, no cervical motion tenderness, rectovaginal septum normal, uterus normal size, shape, and consistency and vagina normal without discharge    Assessment/Plan:   Patient was seen for a health maintenance exam.  Counseled the patient on health maintenance issues. Reviewed her health mainteance schedule and ordered appropriate tests (see orders.) Counseled on regular exercise and weight management. Recommend regular eye exams and dental cleaning.   The following issues were  addressed today for health maintenance:   Christine Hopkins was seen today for annual exam.  Diagnoses and all orders for this visit:  Encounter for health maintenance examination in adult- discussed age appropriate screenings Continue exercise -     MM Digital Screening; Future -     Ambulatory referral to Gastroenterology -     Pap IG, CT/NG NAA, and HPV (high risk) -     HIV antibody -     HCV Ab w/Rflx to Verification  Encounter for hepatitis C screening test for low risk patient -     HCV Ab w/Rflx to Verification  Screen for colon cancer -     Ambulatory referral to Gastroenterology  Screening for cervical cancer -     Pap IG, CT/NG NAA, and HPV (high risk)  Screening for human papillomavirus (HPV) -     Pap IG, CT/NG NAA, and HPV (high risk)  Screening for HIV (human immunodeficiency virus) -     HIV antibody  Screening for breast cancer -     MM Digital Screening; Future  Dyslipidemia- will check levels -     Comprehensive metabolic panel -     Lipid panel -     CBC -     TSH  Essential hypertension- continue currents meds Six months follow up for bp check and weight check -     CBC -     TSH  Need for Tdap vaccination -     Tdap vaccine greater than or equal to 7yo IM    Return in about 6 months (around 03/16/2018) for blood pressure.    Body mass index is 37.66 kg/m.:  Discussed the patient's BMI with patient. The BMI body mass index is 37.66 kg/m.     No future appointments.  Patient Instructions       IF you received an x-ray today, you will receive an invoice from [facility]. Please contact [facility] at [phone] with questions or concerns regarding your invoice.   IF you received labwork today, you will receive an invoice from [facility]. Please contact [facility] at [phone] with questions or concerns regarding your invoice.   Our billing staff will not be able to assist you with questions regarding bills from these  companies.  You will be contacted with the lab results as soon  as they are available. The fastest way to get your results is to activate your My Chart account. Instructions are located on the last page of this paperwork. If you have not heard from us regarding the results in 2 weeks, please contact this office.

## 2017-09-13 NOTE — Patient Instructions (Signed)
     IF you received an x-ray today, you will receive an invoice from [facility]. Please contact [facility] at [phone] with questions or concerns regarding your invoice.   IF you received labwork today, you will receive an invoice from [facility]. Please contact [facility] at [phone] with questions or concerns regarding your invoice.   Our billing staff will not be able to assist you with questions regarding bills from these companies.  You will be contacted with the lab results as soon as they are available. The fastest way to get your results is to activate your My Chart account. Instructions are located on the last page of this paperwork. If you have not heard from us regarding the results in 2 weeks, please contact this office.     

## 2017-09-14 LAB — CBC
HEMATOCRIT: 38.3 % (ref 34.0–46.6)
HEMOGLOBIN: 12.3 g/dL (ref 11.1–15.9)
MCH: 26.6 pg (ref 26.6–33.0)
MCHC: 32.1 g/dL (ref 31.5–35.7)
MCV: 83 fL (ref 79–97)
Platelets: 298 10*3/uL (ref 150–379)
RBC: 4.62 x10E6/uL (ref 3.77–5.28)
RDW: 16 % — ABNORMAL HIGH (ref 12.3–15.4)
WBC: 7.6 10*3/uL (ref 3.4–10.8)

## 2017-09-14 LAB — COMPREHENSIVE METABOLIC PANEL
ALBUMIN: 4.2 g/dL (ref 3.6–4.8)
ALK PHOS: 75 IU/L (ref 39–117)
ALT: 12 IU/L (ref 0–32)
AST: 16 IU/L (ref 0–40)
Albumin/Globulin Ratio: 1.6 (ref 1.2–2.2)
BUN / CREAT RATIO: 11 — AB (ref 12–28)
BUN: 9 mg/dL (ref 8–27)
Bilirubin Total: <0.2 mg/dL (ref 0.0–1.2)
CALCIUM: 9.6 mg/dL (ref 8.7–10.3)
CO2: 28 mmol/L (ref 20–29)
Chloride: 103 mmol/L (ref 96–106)
Creatinine, Ser: 0.8 mg/dL (ref 0.57–1.00)
GFR calc Af Amer: 91 mL/min/{1.73_m2} (ref >59)
GFR, EST NON AFRICAN AMERICAN: 79 mL/min/{1.73_m2} (ref >59)
GLUCOSE: 104 mg/dL — AB (ref 65–99)
Globulin, Total: 2.7 g/dL (ref 1.5–4.5)
Potassium: 3.8 mmol/L (ref 3.5–5.2)
Sodium: 146 mmol/L — ABNORMAL HIGH (ref 134–144)
Total Protein: 6.9 g/dL (ref 6.0–8.5)

## 2017-09-14 LAB — HIV ANTIBODY (ROUTINE TESTING W REFLEX): HIV SCREEN 4TH GENERATION: NONREACTIVE

## 2017-09-14 LAB — LIPID PANEL
CHOLESTEROL TOTAL: 202 mg/dL — AB (ref 100–199)
Chol/HDL Ratio: 3.8 ratio (ref 0.0–4.4)
HDL: 53 mg/dL (ref >39)
LDL Calculated: 111 mg/dL — ABNORMAL HIGH (ref 0–99)
TRIGLYCERIDES: 190 mg/dL — AB (ref 0–149)
VLDL Cholesterol Cal: 38 mg/dL (ref 5–40)

## 2017-09-14 LAB — TSH: TSH: 1.41 u[IU]/mL (ref 0.450–4.500)

## 2017-09-14 LAB — HCV AB W/RFLX TO VERIFICATION: HCV Ab: <0.1 s/co ratio (ref 0.0–0.9)

## 2017-09-14 LAB — HCV INTERPRETATION

## 2017-09-15 LAB — PAP IG, CT-NG NAA, HPV HIGH-RISK
Chlamydia, Nuc. Acid Amp: NEGATIVE
Gonococcus by Nucleic Acid Amp: NEGATIVE
HPV, high-risk: NEGATIVE
PAP SMEAR COMMENT: 0

## 2017-09-19 ENCOUNTER — Encounter: Payer: Self-pay | Admitting: *Deleted

## 2018-01-26 ENCOUNTER — Telehealth: Payer: Self-pay | Admitting: Family Medicine

## 2018-01-26 MED ORDER — SIMVASTATIN 40 MG PO TABS: 40.0000 mg | ORAL_TABLET | Freq: Every day | ORAL | 0 refills | Status: DC

## 2018-01-26 MED ORDER — SERTRALINE HCL 100 MG PO TABS: 100.0000 mg | ORAL_TABLET | Freq: Every day | ORAL | 2 refills | Status: DC

## 2018-01-26 MED ORDER — LISINOPRIL-HYDROCHLOROTHIAZIDE 10-12.5 MG PO TABS: 1.0000 | ORAL_TABLET | Freq: Every day | ORAL | 0 refills | Status: DC

## 2018-01-26 NOTE — Telephone Encounter (Signed)
Copied from CRM [phone]. Topic: Quick Communication - Rx Refill/Question >> Jan 26, 2018  8:10 AM Oneal GroutSebastian, Jennifer S wrote: Medication: sertraline (ZOLOFT) 100 MG tablet, lisinopril-hydrochlorothiazide (PRINZIDE,ZESTORETIC) 10-12.5 MG tablet and simvastatin (ZOCOR) 40 MG tablet   Has the patient contacted their pharmacy?  (Agent: If no, request that the patient contact the pharmacy for the refill.) (Agent: If yes, when and what did the pharmacy advise?)  Preferred Pharmacy (with phone number or street name): [facility] [address]   Agent: Please be advised that RX refills may take up to 3 business days. We ask that you follow-up with your pharmacy.

## 2018-03-13 ENCOUNTER — Emergency Department (HOSPITAL_COMMUNITY): Payer: Self-pay

## 2018-03-13 ENCOUNTER — Encounter (HOSPITAL_COMMUNITY): Payer: Self-pay

## 2018-03-13 ENCOUNTER — Other Ambulatory Visit: Payer: Self-pay

## 2018-03-13 ENCOUNTER — Emergency Department (HOSPITAL_COMMUNITY)
Admission: EM | Admit: 2018-03-13 | Discharge: 2018-03-13 | Disposition: A | Discharge status: Home / Self Care | Payer: Self-pay | Attending: Emergency Medicine | Admitting: Emergency Medicine

## 2018-03-13 DIAGNOSIS — M109 Gout, unspecified: Secondary | ICD-10-CM | POA: Insufficient documentation

## 2018-03-13 DIAGNOSIS — Z9660 Presence of unspecified orthopedic joint implant: Secondary | ICD-10-CM | POA: Insufficient documentation

## 2018-03-13 DIAGNOSIS — I10 Essential (primary) hypertension: Secondary | ICD-10-CM | POA: Insufficient documentation

## 2018-03-13 DIAGNOSIS — Z79899 Other long term (current) drug therapy: Secondary | ICD-10-CM | POA: Insufficient documentation

## 2018-03-13 MED ORDER — PREDNISONE 20 MG PO TABS
40.0000 mg | ORAL_TABLET | Freq: Once | ORAL | Status: AC
  Administered 2018-03-13: 40 mg via ORAL
  Filled 2018-03-13: qty 2

## 2018-03-13 MED ORDER — PREDNISONE 10 MG PO TABS: 10.0000 mg | ORAL_TABLET | Freq: Every day | ORAL | 0 refills | Status: DC

## 2018-03-13 MED ORDER — OXYCODONE-ACETAMINOPHEN 5-325 MG PO TABS: 1.0000 | ORAL_TABLET | Freq: Three times a day (TID) | ORAL | 0 refills | Status: DC | PRN

## 2018-03-13 NOTE — Discharge Instructions (Signed)
Return to ED for worsening symptoms, injuries or falls, numbness in legs, swelling of your foot or leg.

## 2018-03-13 NOTE — ED Notes (Signed)
Pt reports she started to have pain on top of her R foot 3 days ago, pain resolved but her L foot started to hurt the next day.  Pain is in the joint of her L great toe.  Erythema and edema noted.  She reports eating a lot of red meat and drinking plenty of lemonade prior to.

## 2018-03-13 NOTE — ED Triage Notes (Signed)
Patient c/o left foot and left great toe pain and redness x 2 days. Patient denies any injury.

## 2018-03-13 NOTE — ED Provider Notes (Signed)
[facility]-EMERGENCY DEPT Provider Note   CSN: [id number] Arrival date & time: 03/13/18  1332     History   Chief Complaint Chief Complaint  Patient presents with  . Foot Pain    HPI Christine Hopkins is a 62 y.o. female with a past medical history of hypertension, hyperlipidemia, arthritis presents for 3-day history of a left great toe pain, redness and swelling.  She believes this is caused by eating "a whole bunch of pork" over the weekend.  Is not have a history of gout but feels that this may be due to her food intake.  Has not taken any medication help with symptoms.  No history of similar symptoms in the past.  Denies any injuries or falls, fever, trauma to the area.  HPI  Past Medical History:  Diagnosis Date  . Anxiety   . Arthritis   . Depression   . GERD (gastroesophageal reflux disease)   . History of back surgery   . Hyperlipidemia   . Hypertension   . Neuromuscular disorder [facility](HCC)     Patient Active Problem List   Diagnosis Date Noted  . History of back surgery 11/10/2016  . Essential hypertension 07/15/2016  . Dyslipidemia 07/15/2016  . Class 2 obesity due to excess calories with serious comorbidity and body mass index (BMI) of 36.0 to 36.9 in adult 07/15/2016  . Adjustment disorder with depressed mood 07/15/2016    Past Surgical History:  Procedure Laterality Date  . FRACTURE SURGERY    . JOINT REPLACEMENT    . SPINE SURGERY       OB History   None      Home Medications    Prior to Admission medications   Medication Sig Start Date End Date Taking? Authorizing Provider  HYDROcodone-acetaminophen (NORCO/VICODIN) 5-325 MG tablet Take 1 tablet by mouth every 6 (six) hours as needed. 06/20/16   Bethel BornGekas, Kelly Marie, PA-C  ibuprofen (ADVIL,MOTRIN) 800 MG tablet Take 1 tablet (800 mg total) by mouth 3 (three) times daily. 09/13/17   Doristine BosworthStallings, Zoe A, MD  lisinopril-hydrochlorothiazide (PRINZIDE,ZESTORETIC) 10-12.5 MG tablet Take 1  tablet by mouth daily. 01/26/18   Doristine BosworthStallings, Zoe A, MD  oxyCODONE-acetaminophen (PERCOCET/ROXICET) 5-325 MG tablet Take 1 tablet by mouth every 8 (eight) hours as needed for severe pain. 03/13/18   Ieasha Boerema, PA-C  predniSONE (DELTASONE) 10 MG tablet Take 1 tablet (10 mg total) by mouth daily. Take 40mg  tomorrow, then 30mg  for 2 days, then 20mg  for 2 days 03/13/18   Dietrich PatesKhatri, Maysen Sudol, PA-C  sertraline (ZOLOFT) 100 MG tablet Take 1 tablet (100 mg total) by mouth daily. 01/26/18   Doristine BosworthStallings, Zoe A, MD  simvastatin (ZOCOR) 40 MG tablet Take 1 tablet (40 mg total) by mouth daily. 01/26/18   Doristine BosworthStallings, Zoe A, MD    Family History Family History  Problem Relation Age of Onset  . Depression Father   . Mental illness Sister   . Depression Sister   . Schizophrenia Sister   . Diabetes Brother   . Hyperlipidemia Brother   . Mental illness Brother   . Depression Brother   . Schizophrenia Brother   . Heart disease Brother   . Diabetes Brother   . Diabetes Maternal Grandmother   . Heart disease Maternal Grandfather   . Heart disease Paternal Grandmother     Social History Social History   Tobacco Use  . Smoking status: Never Smoker  . Smokeless tobacco: Never Used  Substance Use Topics  . Alcohol use:  No  . Drug use: No     Allergies   Prozac [fluoxetine hcl]   Review of Systems Review of Systems  Constitutional: Negative for chills and fever.  Musculoskeletal: Positive for arthralgias and joint swelling. Negative for back pain, gait problem and myalgias.  Skin: Positive for color change. Negative for wound.  Neurological: Negative for weakness and numbness.     Physical Exam Updated Vital Signs BP 111/71 (BP Location: Right Arm)   Pulse 74   Temp 97.9 F (36.6 C) (Oral)   Resp 15   Ht 5\' 4"  (1.626 m)   Wt 97.5 kg   SpO2 97%   BMI 36.90 kg/m   Physical Exam  Constitutional: She appears well-developed and well-nourished. No distress.  HENT:  Head: Normocephalic and  atraumatic.  Eyes: Conjunctivae and EOM are normal. No scleral icterus.  Neck: Normal range of motion.  Pulmonary/Chest: Effort normal. No respiratory distress.  Musculoskeletal: Normal range of motion. She exhibits edema and tenderness.  Tenderness palpation of the left great toe with mild edema and erythema noted.  Full active and passive range of motion of joints without difficulty.  Area is neurovascularly intact.  2+ DP pulse palpated.  Neurological: She is alert.  Skin: No rash noted. She is not diaphoretic.  Psychiatric: She has a normal mood and affect.  Nursing note and vitals reviewed.    ED Treatments / Results  Labs (all labs ordered are listed, but only abnormal results are displayed) Labs Reviewed - No data to display  EKG None  Radiology Dg Foot Complete Left  Result Date: 03/13/2018 CLINICAL DATA:  Acute left foot pain without known injury. EXAM: LEFT FOOT - COMPLETE 3+ VIEW COMPARISON:  None. FINDINGS: There is no evidence of fracture or dislocation. There is no evidence of arthropathy or other focal bone abnormality. Soft tissues are unremarkable. IMPRESSION: Normal left foot. Electronically Signed   By: Lupita Raider, M.D.   On: 03/13/2018 15:23    Procedures Procedures (including critical care time)  Medications Ordered in ED Medications  predniSONE (DELTASONE) tablet 40 mg (has no administration in time range)     Initial Impression / Assessment and Plan / ED Course  I have reviewed the triage vital signs and the nursing notes.  Pertinent labs & imaging results that were available during my care of the patient were reviewed by me and considered in my medical decision making (see chart for details).     63 year old female presents to ED for evaluation of left great toe pain for the past 3 days.  She believes is due to her food intake.  No history of gout in the past.  On exam there is edema, erythema and tenderness to palpation of the left great toe.   Full active and passive range of motion noted.  She is afebrile.  No trauma to the area.  X-ray is unremarkable.  Doubt vascular cause of symptoms.  Patient educated on low purine diet and establishing care with primary care provider if symptoms recur.  Advised to return to ED for any severe worsening symptoms. [place] PMP reviewed with no discrepancies.  Portions of this note were generated with Scientist, clinical (histocompatibility and immunogenetics). Dictation errors may occur despite best attempts at proofreading.    Final Clinical Impressions(s) / ED Diagnoses   Final diagnoses:  Acute gout involving toe of left foot, unspecified cause    ED Discharge Orders         Ordered    predniSONE (  DELTASONE) 10 MG tablet  Daily     03/13/18 1555    oxyCODONE-acetaminophen (PERCOCET/ROXICET) 5-325 MG tablet  Every 8 hours PRN     03/13/18 1555           Dietrich Pates, PA-C 03/13/18 1558    Pricilla Loveless, MD 03/14/18 2330

## 2018-03-14 ENCOUNTER — Ambulatory Visit: Payer: Self-pay | Admitting: Family Medicine

## 2018-03-20 ENCOUNTER — Other Ambulatory Visit: Payer: Self-pay

## 2018-03-20 ENCOUNTER — Emergency Department (HOSPITAL_COMMUNITY): Payer: Self-pay

## 2018-03-20 ENCOUNTER — Emergency Department (HOSPITAL_COMMUNITY)
Admission: EM | Admit: 2018-03-20 | Discharge: 2018-03-20 | Disposition: A | Discharge status: Home / Self Care | Payer: Self-pay | Attending: Emergency Medicine | Admitting: Emergency Medicine

## 2018-03-20 ENCOUNTER — Encounter (HOSPITAL_COMMUNITY): Payer: Self-pay

## 2018-03-20 DIAGNOSIS — F419 Anxiety disorder, unspecified: Secondary | ICD-10-CM | POA: Insufficient documentation

## 2018-03-20 DIAGNOSIS — R55 Syncope and collapse: Secondary | ICD-10-CM | POA: Insufficient documentation

## 2018-03-20 DIAGNOSIS — F329 Major depressive disorder, single episode, unspecified: Secondary | ICD-10-CM | POA: Insufficient documentation

## 2018-03-20 DIAGNOSIS — Z79899 Other long term (current) drug therapy: Secondary | ICD-10-CM | POA: Insufficient documentation

## 2018-03-20 DIAGNOSIS — I10 Essential (primary) hypertension: Secondary | ICD-10-CM | POA: Insufficient documentation

## 2018-03-20 LAB — COMPREHENSIVE METABOLIC PANEL
ALBUMIN: 3.6 g/dL (ref 3.5–5.0)
ALT: 15 U/L (ref 0–44)
AST: 18 U/L (ref 15–41)
Alkaline Phosphatase: 61 U/L (ref 38–126)
Anion gap: 8 (ref 5–15)
BILIRUBIN TOTAL: 0.4 mg/dL (ref 0.3–1.2)
BUN: 17 mg/dL (ref 8–23)
CO2: 27 mmol/L (ref 22–32)
CREATININE: 0.91 mg/dL (ref 0.44–1.00)
Calcium: 9 mg/dL (ref 8.9–10.3)
Chloride: 112 mmol/L — ABNORMAL HIGH (ref 98–111)
GFR calc Af Amer: >60 mL/min (ref >60)
GFR calc non Af Amer: >60 mL/min (ref >60)
GLUCOSE: 119 mg/dL — AB (ref 70–99)
Potassium: 4 mmol/L (ref 3.5–5.1)
Sodium: 147 mmol/L — ABNORMAL HIGH (ref 135–145)
TOTAL PROTEIN: 6.4 g/dL — AB (ref 6.5–8.1)

## 2018-03-20 LAB — URINALYSIS, ROUTINE W REFLEX MICROSCOPIC
Bilirubin Urine: NEGATIVE
GLUCOSE, UA: NEGATIVE mg/dL
KETONES UR: NEGATIVE mg/dL
Leukocytes, UA: NEGATIVE
Nitrite: NEGATIVE
PROTEIN: NEGATIVE mg/dL
Specific Gravity, Urine: 1.01 (ref 1.005–1.030)
pH: 7 (ref 5.0–8.0)

## 2018-03-20 LAB — CBC
HEMATOCRIT: 35.7 % — AB (ref 36.0–46.0)
HEMOGLOBIN: 11.7 g/dL — AB (ref 12.0–15.0)
MCH: 27.6 pg (ref 26.0–34.0)
MCHC: 32.8 g/dL (ref 30.0–36.0)
MCV: 84.2 fL (ref 78.0–100.0)
Platelets: 270 10*3/uL (ref 150–400)
RBC: 4.24 MIL/uL (ref 3.87–5.11)
RDW: 15.7 % — ABNORMAL HIGH (ref 11.5–15.5)
WBC: 11.5 10*3/uL — ABNORMAL HIGH (ref 4.0–10.5)

## 2018-03-20 MED ORDER — SODIUM CHLORIDE 0.9 % IV BOLUS
1000.0000 mL | Freq: Once | INTRAVENOUS | Status: AC
  Administered 2018-03-20: 1000 mL via INTRAVENOUS

## 2018-03-20 NOTE — ED Triage Notes (Signed)
Patient arrived via POV. Yesterday patient had 2 episodes of syncope. Fell to the ground backwards and LOC. Denies hitting her head the first time. The second time she was going into bedroom and woke up in the bathroom. Patient states it happened in seconds and didn't know where she was. Patient fell forward and hit her nose and lip. This morning patient woke up feeling heavy and feels as if she could pass out.

## 2018-03-20 NOTE — ED Notes (Signed)
Pt. Aware of urine specimen. Will collect urine when pt. Voids. Nurse aware.  

## 2018-03-20 NOTE — ED Notes (Signed)
Per Dr. Denton Lank, Pt. Could have something to drink. A ginger ale was given. Nurse aware.

## 2018-03-20 NOTE — ED Provider Notes (Signed)
[facility]-EMERGENCY DEPT Provider Note   CSN: [id number] Arrival date & time: 03/20/18  0931     History   Chief Complaint Chief Complaint  Patient presents with  . Loss of Consciousness    HPI Christine Hopkins is a 62 y.o. female.  Patient c/o syncope x 2 yesterday. States when stood up, once in bathroom, felt faint w brief syncopal event. Denies associated cp or discomfort, no sob, no palpitations or sense of rapid or irregular heartbeat. No prior hx syncope. States was seen in ED in the past week with left foot/toe pain, dx w gout - states was on prednisone and nsaid. Those symptoms improved/resolved, but states pcp said her diuretic could cause gout, so diuretic was d/cd and dose of her other bp med was doubled. Pt denies other change in meds. Denies headache. No neck or back pain. No chest pain. No abd pain or nvd. No dysuria or gu c/o. No blood loss, rectal bleeding or melena.   The history is provided by the patient.  Loss of Consciousness   Associated symptoms include light-headedness. Pertinent negatives include abdominal pain, back pain, chest pain, confusion, fever, headaches, vomiting and weakness.    Past Medical History:  Diagnosis Date  . Anxiety   . Arthritis   . Depression   . GERD (gastroesophageal reflux disease)   . History of back surgery   . Hyperlipidemia   . Hypertension   . Neuromuscular disorder [facility])     Patient Active Problem List   Diagnosis Date Noted  . History of back surgery 11/10/2016  . Essential hypertension 07/15/2016  . Dyslipidemia 07/15/2016  . Class 2 obesity due to excess calories with serious comorbidity and body mass index (BMI) of 36.0 to 36.9 in adult 07/15/2016  . Adjustment disorder with depressed mood 07/15/2016    Past Surgical History:  Procedure Laterality Date  . FRACTURE SURGERY    . JOINT REPLACEMENT    . SPINE SURGERY       OB History   None      Home Medications    Prior to  Admission medications   Medication Sig Start Date End Date Taking? Authorizing Provider  HYDROcodone-acetaminophen (NORCO/VICODIN) 5-325 MG tablet Take 1 tablet by mouth every 6 (six) hours as needed. Patient not taking: Reported on 03/13/2018 06/20/16   Bethel Born, PA-C  ibuprofen (ADVIL,MOTRIN) 800 MG tablet Take 1 tablet (800 mg total) by mouth 3 (three) times daily. 09/13/17   Doristine Bosworth, MD  lisinopril-hydrochlorothiazide (PRINZIDE,ZESTORETIC) 10-12.5 MG tablet Take 1 tablet by mouth daily. 01/26/18   Doristine Bosworth, MD  oxyCODONE-acetaminophen (PERCOCET/ROXICET) 5-325 MG tablet Take 1 tablet by mouth every 8 (eight) hours as needed for severe pain. 03/13/18   Khatri, Hina, PA-C  predniSONE (DELTASONE) 10 MG tablet Take 1 tablet (10 mg total) by mouth daily. Take 40mg  tomorrow, then 30mg  for 2 days, then 20mg  for 2 days 03/13/18   Dietrich Pates, PA-C  sertraline (ZOLOFT) 100 MG tablet Take 1 tablet (100 mg total) by mouth daily. 01/26/18   Doristine Bosworth, MD  simvastatin (ZOCOR) 40 MG tablet Take 1 tablet (40 mg total) by mouth daily. 01/26/18   Doristine Bosworth, MD    Family History Family History  Problem Relation Age of Onset  . Depression Father   . Mental illness Sister   . Depression Sister   . Schizophrenia Sister   . Diabetes Brother   . Hyperlipidemia Brother   .  Mental illness Brother   . Depression Brother   . Schizophrenia Brother   . Heart disease Brother   . Diabetes Brother   . Diabetes Maternal Grandmother   . Heart disease Maternal Grandfather   . Heart disease Paternal Grandmother     Social History Social History   Tobacco Use  . Smoking status: Never Smoker  . Smokeless tobacco: Never Used  Substance Use Topics  . Alcohol use: No  . Drug use: No     Allergies   Prozac [fluoxetine hcl]   Review of Systems Review of Systems  Constitutional: Negative for fever.  HENT: Negative for sore throat.   Eyes: Negative for redness.  Respiratory:  Negative for shortness of breath.   Cardiovascular: Positive for syncope. Negative for chest pain.  Gastrointestinal: Negative for abdominal pain, blood in stool and vomiting.  Endocrine: Negative for polyuria.  Genitourinary: Negative for dysuria and flank pain.  Musculoskeletal: Negative for back pain and neck pain.  Skin: Negative for rash and wound.  Neurological: Positive for light-headedness. Negative for weakness, numbness and headaches.  Hematological: Does not bruise/bleed easily.  Psychiatric/Behavioral: Negative for confusion.     Physical Exam Updated Vital Signs BP 136/85 (BP Location: Right Arm)   Pulse 64   Temp 98.2 F (36.8 C) (Oral)   Resp 17   Ht 1.626 m (5\' 4" )   Wt 99.3 kg   SpO2 96%   BMI 37.59 kg/m   Physical Exam  Constitutional: She appears well-developed and well-nourished.  HENT:  Contusion to nose and upper lip. No nasal septal hematoma. Teeth intact, no malocclusion. Facial bones/orbits grossly intact.   Eyes: Pupils are equal, round, and reactive to light. Conjunctivae are normal. No scleral icterus.  Neck: Neck supple. No tracheal deviation present. No thyromegaly present.  No bruits.   Cardiovascular: Normal rate, regular rhythm, normal heart sounds and intact distal pulses. Exam reveals no gallop and no friction rub.  No murmur heard. Pulmonary/Chest: Effort normal and breath sounds normal. No respiratory distress.  Abdominal: Soft. Normal appearance and bowel sounds are normal. She exhibits no distension. There is no tenderness.  Genitourinary:  Genitourinary Comments: No cva tenderness.   Musculoskeletal: She exhibits no edema.  CTLS spine, non tender, aligned, no step off. No focal bony tenderness on bil extremity exam.   Neurological: She is alert.  Speech clear/fluent. Motor/sens grossly intact. Steady gait.   Skin: Skin is warm and dry. No rash noted.  Psychiatric: She has a normal mood and affect.  Nursing note and vitals  reviewed.    ED Treatments / Results  Labs (all labs ordered are listed, but only abnormal results are displayed) Results for orders placed or performed during the hospital encounter of 03/20/18  CBC  Result Value Ref Range   WBC 11.5 (H) 4.0 - 10.5 K/uL   RBC 4.24 3.87 - 5.11 MIL/uL   Hemoglobin 11.7 (L) 12.0 - 15.0 g/dL   HCT 16.1 (L) 09.6 - 04.5 %   MCV 84.2 78.0 - 100.0 fL   MCH 27.6 26.0 - 34.0 pg   MCHC 32.8 30.0 - 36.0 g/dL   RDW 40.9 (H) 81.1 - 91.4 %   Platelets 270 150 - 400 K/uL  Comprehensive metabolic panel  Result Value Ref Range   Sodium 147 (H) 135 - 145 mmol/L   Potassium 4.0 3.5 - 5.1 mmol/L   Chloride 112 (H) 98 - 111 mmol/L   CO2 27 22 - 32 mmol/L   Glucose,  Bld 119 (H) 70 - 99 mg/dL   BUN 17 8 - 23 mg/dL   Creatinine, Ser 4.09 0.44 - 1.00 mg/dL   Calcium 9.0 8.9 - 81.1 mg/dL   Total Protein 6.4 (L) 6.5 - 8.1 g/dL   Albumin 3.6 3.5 - 5.0 g/dL   AST 18 15 - 41 U/L   ALT 15 0 - 44 U/L   Alkaline Phosphatase 61 38 - 126 U/L   Total Bilirubin 0.4 0.3 - 1.2 mg/dL   GFR calc non Af Amer >60 >60 mL/min   GFR calc Af Amer >60 >60 mL/min   Anion gap 8 5 - 15  Urinalysis, Routine w reflex microscopic  Result Value Ref Range   Color, Urine STRAW (A) YELLOW   APPearance CLEAR CLEAR   Specific Gravity, Urine 1.010 1.005 - 1.030   pH 7.0 5.0 - 8.0   Glucose, UA NEGATIVE NEGATIVE mg/dL   Hgb urine dipstick SMALL (A) NEGATIVE   Bilirubin Urine NEGATIVE NEGATIVE   Ketones, ur NEGATIVE NEGATIVE mg/dL   Protein, ur NEGATIVE NEGATIVE mg/dL   Nitrite NEGATIVE NEGATIVE   Leukocytes, UA NEGATIVE NEGATIVE   RBC / HPF 0-5 0 - 5 RBC/hpf   WBC, UA 0-5 0 - 5 WBC/hpf   Bacteria, UA RARE (A) NONE SEEN   Squamous Epithelial / LPF 0-5 0 - 5   Ct Head Wo Contrast  Result Date: 03/20/2018 CLINICAL DATA:  Recent episodes of syncope with falls and loss of consciousness EXAM: CT HEAD WITHOUT CONTRAST TECHNIQUE: Contiguous axial images were obtained from the base of the skull  through the vertex without intravenous contrast. COMPARISON:  None. FINDINGS: Brain: The ventricular system is normal in size and configuration and the septum is midline in position. The fourth ventricle and basilar cisterns are unremarkable. No hemorrhage, mass lesion, or acute infarction is seen. Very mild small vessel ischemic change within the periventricular white matter may be present. Vascular: No vascular abnormality is seen on this unenhanced study. Skull: On bone window images, no calvarial fracture is seen. Sinuses/Orbits: The paranasal sinuses are well pneumatized. Other: None. IMPRESSION: No acute intracranial abnormality. Very mild small vessel ischemic change in the periventricular white matter cannot excluded. Electronically Signed   By: Dwyane Dee M.D.   On: 03/20/2018 11:38   Dg Foot Complete Left  Result Date: 03/13/2018 CLINICAL DATA:  Acute left foot pain without known injury. EXAM: LEFT FOOT - COMPLETE 3+ VIEW COMPARISON:  None. FINDINGS: There is no evidence of fracture or dislocation. There is no evidence of arthropathy or other focal bone abnormality. Soft tissues are unremarkable. IMPRESSION: Normal left foot. Electronically Signed   By: Lupita Raider, M.D.   On: 03/13/2018 15:23    EKG None  Radiology Ct Head Wo Contrast  Result Date: 03/20/2018 CLINICAL DATA:  Recent episodes of syncope with falls and loss of consciousness EXAM: CT HEAD WITHOUT CONTRAST TECHNIQUE: Contiguous axial images were obtained from the base of the skull through the vertex without intravenous contrast. COMPARISON:  None. FINDINGS: Brain: The ventricular system is normal in size and configuration and the septum is midline in position. The fourth ventricle and basilar cisterns are unremarkable. No hemorrhage, mass lesion, or acute infarction is seen. Very mild small vessel ischemic change within the periventricular white matter may be present. Vascular: No vascular abnormality is seen on this  unenhanced study. Skull: On bone window images, no calvarial fracture is seen. Sinuses/Orbits: The paranasal sinuses are well pneumatized. Other: None. IMPRESSION:  No acute intracranial abnormality. Very mild small vessel ischemic change in the periventricular white matter cannot excluded. Electronically Signed   By: Dwyane Dee M.D.   On: 03/20/2018 11:38    Procedures Procedures (including critical care time)  Medications Ordered in ED Medications - No data to display   Initial Impression / Assessment and Plan / ED Course  I have reviewed the triage vital signs and the nursing notes.  Pertinent labs & imaging results that were available during my care of the patient were reviewed by me and considered in my medical decision making (see chart for details).  Labs. Ecg.   Reviewed nursing notes and prior charts for additional history.   Labs reviewed - na sl high 147, prior 146. Iv ns bolus. Po fluids.  Ct reviewed - neg acute.  Po fluids - tolerates well. Pt ambulatory in ED, no faintness or dizziness.   Currently asymptomatic. Off her bp med for past 2 days, bp normal currently. Will hold bp med, encourage po fluids. For syncope, will have f/u card/pcp. Return precautions provided.     Final Clinical Impressions(s) / ED Diagnoses   Final diagnoses:  None    ED Discharge Orders    None       Cathren Laine, MD 03/20/18 1549

## 2018-03-20 NOTE — Discharge Instructions (Signed)
It was our pleasure to provide your ER care today - we hope that you feel better.  Rest. Drink plenty of fluids.  From today's lab tests your sodium level is mildly high (147) - drink adequate water, and follow up with primary care doctor in 1-2 week.  For fainting - hold your blood pressure medication for now - follow up with cardiologist in the next 1-2 weeks - call office to arrange appointment.  Return to ER if worse, new symptoms, fevers, chest pain, trouble breathing, recurrent fainting, other concern.

## 2018-03-20 NOTE — ED Notes (Signed)
Patient aware we need a urine sample.  

## 2018-04-03 ENCOUNTER — Encounter (HOSPITAL_COMMUNITY): Payer: Self-pay | Admitting: Emergency Medicine

## 2018-04-03 ENCOUNTER — Emergency Department (HOSPITAL_COMMUNITY)
Admission: EM | Admit: 2018-04-03 | Discharge: 2018-04-03 | Disposition: A | Discharge status: Home / Self Care | Payer: Self-pay | Attending: Emergency Medicine | Admitting: Emergency Medicine

## 2018-04-03 DIAGNOSIS — Z79899 Other long term (current) drug therapy: Secondary | ICD-10-CM | POA: Insufficient documentation

## 2018-04-03 DIAGNOSIS — I10 Essential (primary) hypertension: Secondary | ICD-10-CM | POA: Insufficient documentation

## 2018-04-03 DIAGNOSIS — M109 Gout, unspecified: Secondary | ICD-10-CM | POA: Insufficient documentation

## 2018-04-03 MED ORDER — COLCHICINE 0.6 MG PO TABS: ORAL_TABLET | ORAL | 0 refills | Status: DC

## 2018-04-03 MED ORDER — HYDROCODONE-ACETAMINOPHEN 5-325 MG PO TABS: 1.0000 | ORAL_TABLET | Freq: Four times a day (QID) | ORAL | 0 refills | Status: DC | PRN

## 2018-04-03 MED ORDER — PREDNISONE 10 MG (21) PO TBPK: ORAL_TABLET | ORAL | 0 refills | Status: DC

## 2018-04-03 NOTE — Discharge Instructions (Addendum)
Contact a health care provider if: °You have another gout attack. °You continue to have symptoms of a gout attack after10 days of treatment. °You have side effects from your medicines. °You have chills or a fever. °You have burning pain when you urinate. °You have pain in your lower back or belly. °Get help right away if: °You have severe or uncontrolled pain. °You cannot urinate. °

## 2018-04-03 NOTE — ED Triage Notes (Signed)
Pt c/o gout in left foot since Friday. Reports that her PCP is closed today and the medication she is on is not helping.

## 2018-04-03 NOTE — ED Provider Notes (Signed)
[facility]-EMERGENCY DEPT Provider Note   CSN: [id number] Arrival date & time: 04/03/18  1407     History   Chief Complaint Chief Complaint  Patient presents with  . Gout    HPI Christine Hopkins is a 62 y.o. female presents with Left foot pain and swelling. Seen 2 weeks ago for he same, given prednisone and oxycodone with minimal relief. She had a negative xray.  She saw her PCP who placed her on indomethacin which has not improved her pain. She has not seen orthopedics. Pain is worse at night and when she the covers touch her foot. Worse with ambulation . Pain is most severe in the L 1st MTP joint. No calf pain, no cp/sob. No injuries, No fevers/chills.  HPI  Past Medical History:  Diagnosis Date  . Anxiety   . Arthritis   . Depression   . GERD (gastroesophageal reflux disease)   . History of back surgery   . Hyperlipidemia   . Hypertension   . Neuromuscular disorder [facility])     Patient Active Problem List   Diagnosis Date Noted  . History of back surgery 11/10/2016  . Essential hypertension 07/15/2016  . Dyslipidemia 07/15/2016  . Class 2 obesity due to excess calories with serious comorbidity and body mass index (BMI) of 36.0 to 36.9 in adult 07/15/2016  . Adjustment disorder with depressed mood 07/15/2016    Past Surgical History:  Procedure Laterality Date  . FRACTURE SURGERY    . JOINT REPLACEMENT    . SPINE SURGERY       OB History   None      Home Medications    Prior to Admission medications   Medication Sig Start Date End Date Taking? Authorizing Provider  HYDROcodone-acetaminophen (NORCO/VICODIN) 5-325 MG tablet Take 1 tablet by mouth every 6 (six) hours as needed. Patient not taking: Reported on 03/13/2018 06/20/16   Bethel Born, PA-C  ibuprofen (ADVIL,MOTRIN) 800 MG tablet Take 1 tablet (800 mg total) by mouth 3 (three) times daily. Patient not taking: Reported on 03/20/2018 09/13/17   Doristine Bosworth, MD  indomethacin  (INDOCIN) 50 MG capsule Take 50 mg by mouth 3 (three) times daily. 03/15/18   [provider]  lisinopril (PRINIVIL,ZESTRIL) 20 MG tablet Take 20 mg by mouth daily. 03/15/18   [provider]  lisinopril-hydrochlorothiazide (PRINZIDE,ZESTORETIC) 10-12.5 MG tablet Take 1 tablet by mouth daily. Patient not taking: Reported on 03/20/2018 01/26/18   Doristine Bosworth, MD  oxyCODONE-acetaminophen (PERCOCET/ROXICET) 5-325 MG tablet Take 1 tablet by mouth every 8 (eight) hours as needed for severe pain. 03/13/18   Khatri, Hina, PA-C  predniSONE (DELTASONE) 10 MG tablet Take 1 tablet (10 mg total) by mouth daily. Take 40mg  tomorrow, then 30mg  for 2 days, then 20mg  for 2 days Patient not taking: Reported on 03/20/2018 03/13/18   Dietrich Pates, PA-C  sertraline (ZOLOFT) 100 MG tablet Take 1 tablet (100 mg total) by mouth daily. 01/26/18   Doristine Bosworth, MD  simvastatin (ZOCOR) 40 MG tablet Take 1 tablet (40 mg total) by mouth daily. 01/26/18   Doristine Bosworth, MD    Family History Family History  Problem Relation Age of Onset  . Depression Father   . Mental illness Sister   . Depression Sister   . Schizophrenia Sister   . Diabetes Brother   . Hyperlipidemia Brother   . Mental illness Brother   . Depression Brother   . Schizophrenia Brother   . Heart  disease Brother   . Diabetes Brother   . Diabetes Maternal Grandmother   . Heart disease Maternal Grandfather   . Heart disease Paternal Grandmother     Social History Social History   Tobacco Use  . Smoking status: Never Smoker  . Smokeless tobacco: Never Used  Substance Use Topics  . Alcohol use: No  . Drug use: No     Allergies   Prozac [fluoxetine hcl]   Review of Systems Review of Systems Ten systems reviewed and are negative for acute change, except as noted in the HPI.    Physical Exam Updated Vital Signs BP 122/70 (BP Location: Left Arm)   Pulse 64   Temp 97.9 F (36.6 C) (Oral)   Resp 17   SpO2 97%    Physical Exam  Constitutional: She is oriented to person, place, and time. She appears well-developed and well-nourished. No distress.  HENT:  Head: Normocephalic and atraumatic.  Eyes: Conjunctivae are normal. No scleral icterus.  Neck: Normal range of motion.  Cardiovascular: Normal rate, regular rhythm and normal heart sounds. Exam reveals no gallop and no friction rub.  No murmur heard. Pulmonary/Chest: Effort normal and breath sounds normal. No respiratory distress.  Abdominal: Soft. Bowel sounds are normal. She exhibits no distension and no mass. There is no tenderness. There is no guarding.  Musculoskeletal: She exhibits edema and tenderness. She exhibits no deformity.  Erythema and swelling over  The dorsum of the left foot.  No pain with movement of toes 2 through 5.  Pain movement of the first MTP.  Normal ipsilateral ankle and knee examination.  Calf compartments soft, equal in circumference bilaterally  Neurological: She is alert and oriented to person, place, and time.  Skin: Skin is warm and dry. She is not diaphoretic.  Psychiatric: Her behavior is normal.  Nursing note and vitals reviewed.    ED Treatments / Results  Labs (all labs ordered are listed, but only abnormal results are displayed) Labs Reviewed - No data to display  EKG None  Radiology No results found.  Procedures Procedures (including critical care time)  Medications Ordered in ED Medications - No data to display   Initial Impression / Assessment and Plan / ED Course  I have reviewed the triage vital signs and the nursing notes.  Pertinent labs & imaging results that were available during my care of the patient were reviewed by me and considered in my medical decision making (see chart for details).     Pt presents with monoarticular pain, swelling and erythema.  Pt is afebrile and stable. Previous  Imaging reviewed, no evidence of occult fracture or injury. Renal function good. Pt without  known peptic ulcer disease and not receiving concurrent treatment on warfarin. Pt dc with Colchicine. Discussed that pt should respond to treatment with in 24 hour of begining treatment & likely resolve in 2-3 days. Discussed return precautions and follow up    Final Clinical Impressions(s) / ED Diagnoses   Final diagnoses:  None    ED Discharge Orders    None       Arthor Captain, PA-C 04/04/18 1248    Gwyneth Sprout, MD 04/04/18 2337

## 2018-07-03 ENCOUNTER — Encounter (HOSPITAL_COMMUNITY): Payer: Self-pay | Admitting: Emergency Medicine

## 2018-07-03 ENCOUNTER — Other Ambulatory Visit: Payer: Self-pay

## 2018-07-03 ENCOUNTER — Emergency Department (HOSPITAL_COMMUNITY)
Admission: EM | Admit: 2018-07-03 | Discharge: 2018-07-03 | Disposition: A | Discharge status: Home / Self Care | Payer: Self-pay | Attending: Emergency Medicine | Admitting: Emergency Medicine

## 2018-07-03 DIAGNOSIS — Z79899 Other long term (current) drug therapy: Secondary | ICD-10-CM | POA: Insufficient documentation

## 2018-07-03 DIAGNOSIS — M109 Gout, unspecified: Secondary | ICD-10-CM | POA: Insufficient documentation

## 2018-07-03 DIAGNOSIS — I10 Essential (primary) hypertension: Secondary | ICD-10-CM | POA: Insufficient documentation

## 2018-07-03 MED ORDER — COLCHICINE 0.6 MG PO TABS
0.6000 mg | ORAL_TABLET | Freq: Once | ORAL | Status: AC
  Administered 2018-07-03: 0.6 mg via ORAL
  Filled 2018-07-03: qty 1

## 2018-07-03 MED ORDER — COLCHICINE 0.6 MG PO TABS
1.2000 mg | ORAL_TABLET | Freq: Once | ORAL | Status: AC
  Administered 2018-07-03: 1.2 mg via ORAL
  Filled 2018-07-03: qty 2

## 2018-07-03 MED ORDER — PREDNISONE 20 MG PO TABS
60.0000 mg | ORAL_TABLET | Freq: Once | ORAL | Status: AC
  Administered 2018-07-03: 60 mg via ORAL
  Filled 2018-07-03: qty 3

## 2018-07-03 MED ORDER — HYDROCODONE-ACETAMINOPHEN 5-325 MG PO TABS: 1.0000 | ORAL_TABLET | Freq: Three times a day (TID) | ORAL | 0 refills | Status: AC | PRN

## 2018-07-03 MED ORDER — OXYCODONE-ACETAMINOPHEN 5-325 MG PO TABS
1.0000 | ORAL_TABLET | Freq: Once | ORAL | Status: AC
  Administered 2018-07-03: 1 via ORAL
  Filled 2018-07-03: qty 1

## 2018-07-03 MED ORDER — PREDNISONE 10 MG (21) PO TBPK: ORAL_TABLET | ORAL | 0 refills | Status: DC

## 2018-07-03 MED ORDER — COLCHICINE 0.6 MG PO TABS: ORAL_TABLET | ORAL | 0 refills | Status: DC

## 2018-07-03 NOTE — Discharge Instructions (Signed)
We saw in the ER for toe pain, that appears to be because of gout. These take the medications as prescribed and follow-up with your PCP in 5 to 7 days. Return to the ER if your symptoms get worse.

## 2018-07-03 NOTE — ED Notes (Addendum)
Pt asking if she could have something for pain. Explained to pt that in triage pain medications that can be administered without being seen by a physician are limited. Told pt that we could give her a Percocet told help with pain while she waits to be seen. Pt then demanded nurse to get a doctor to see her immediately. Nurse tried to explain to pt that it's not that simple. Told pt the department currently has 1 PA and 1 physician and that the department is full. Also explained to pt that there is a long wait and we have several patients holding for admission. Pt then began yelling at the nurse stating "Just do your damn job and get the damn doctor!" Nurse told pt there is no reason to be disrespectful when nurse was simply trying to explain the process and explain there is a longer wait due to admissions holding in the department. Pt continued to yell at nurse accusing nurse of "arguing" with her. Nurse then told pt she will be waiting in the lobby until she can be seen by a provider. Pt refused to leave triage room. Security was called to stand by. Pt then argued with security and then allowed NT to push her back out to the lobby. Pt then went to registration demanding to speak to "someone in charge" and refused to speak to charge nurse claiming "I know that nurse and the charge nurse are already talking".

## 2018-07-03 NOTE — ED Provider Notes (Addendum)
[facility]-EMERGENCY DEPT Provider Note   CSN: [id number] Arrival date & time: 07/03/18  [id number]     History   Chief Complaint Chief Complaint  Patient presents with  . Gout    HPI Christine Hopkins is a 63 y.o. female.  HPI  63 year old female comes in with chief complaint of toe pain.  Patient has history of gout that was diagnosed about 4 months ago.  She states that yesterday evening she had sudden development of left-sided toe pain and foot pain.  She had gout attack at the same site in September.  Patient describes the pain as throbbing pain.  There was no trauma and she denies any fevers, chills.  Patient is unsure what might have triggered her gout attack.   Past Medical History:  Diagnosis Date  . Anxiety   . Arthritis   . Depression   . GERD (gastroesophageal reflux disease)   . History of back surgery   . Hyperlipidemia   . Hypertension   . Neuromuscular disorder [facility](HCC)     Patient Active Problem List   Diagnosis Date Noted  . History of back surgery 11/10/2016  . Essential hypertension 07/15/2016  . Dyslipidemia 07/15/2016  . Class 2 obesity due to excess calories with serious comorbidity and body mass index (BMI) of 36.0 to 36.9 in adult 07/15/2016  . Adjustment disorder with depressed mood 07/15/2016    Past Surgical History:  Procedure Laterality Date  . FRACTURE SURGERY    . JOINT REPLACEMENT    . SPINE SURGERY       OB History   No obstetric history on file.      Home Medications    Prior to Admission medications   Medication Sig Start Date End Date Taking? Authorizing Provider  allopurinol (ZYLOPRIM) 100 MG tablet Take 100 mg by mouth daily.   Yes [provider]  lisinopril (PRINIVIL,ZESTRIL) 20 MG tablet Take 20 mg by mouth daily. 03/15/18  Yes [provider]  sertraline (ZOLOFT) 100 MG tablet Take 1 tablet (100 mg total) by mouth daily. 01/26/18  Yes Stallings, Zoe A, MD  simvastatin (ZOCOR) 40 MG  tablet Take 1 tablet (40 mg total) by mouth daily. 01/26/18  Yes Collie SiadStallings, Zoe A, MD  colchicine 0.6 MG tablet Take 1 tablet bid for the next 5 days 07/03/18   Derwood KaplanNanavati, Brixton Schnapp, MD  HYDROcodone-acetaminophen (NORCO/VICODIN) 5-325 MG tablet Take 1 tablet by mouth every 8 (eight) hours as needed for up to 3 days for severe pain. 07/03/18 07/06/18  Derwood KaplanNanavati, Starkisha Tullis, MD  ibuprofen (ADVIL,MOTRIN) 800 MG tablet Take 1 tablet (800 mg total) by mouth 3 (three) times daily. Patient not taking: Reported on 03/20/2018 09/13/17   Doristine BosworthStallings, Zoe A, MD  lisinopril-hydrochlorothiazide (PRINZIDE,ZESTORETIC) 10-12.5 MG tablet Take 1 tablet by mouth daily. Patient not taking: Reported on 03/20/2018 01/26/18   Doristine BosworthStallings, Zoe A, MD  oxyCODONE-acetaminophen (PERCOCET/ROXICET) 5-325 MG tablet Take 1 tablet by mouth every 8 (eight) hours as needed for severe pain. Patient not taking: Reported on 07/03/2018 03/13/18   Dietrich PatesKhatri, Hina, PA-C  predniSONE (STERAPRED UNI-PAK 21 TAB) 10 MG (21) TBPK tablet Use as directed 07/03/18   Derwood KaplanNanavati, Jakorian Marengo, MD    Family History Family History  Problem Relation Age of Onset  . Depression Father   . Mental illness Sister   . Depression Sister   . Schizophrenia Sister   . Diabetes Brother   . Hyperlipidemia Brother   . Mental illness Brother   . Depression Brother   .  Schizophrenia Brother   . Heart disease Brother   . Diabetes Brother   . Diabetes Maternal Grandmother   . Heart disease Maternal Grandfather   . Heart disease Paternal Grandmother     Social History Social History   Tobacco Use  . Smoking status: Never Smoker  . Smokeless tobacco: Never Used  Substance Use Topics  . Alcohol use: No  . Drug use: No     Allergies   Prozac [fluoxetine hcl]   Review of Systems Review of Systems  Constitutional: Positive for activity change.  Musculoskeletal: Positive for myalgias.  Skin: Positive for rash.  Allergic/Immunologic: Negative for immunocompromised state.    Hematological: Does not bruise/bleed easily.     Physical Exam Updated Vital Signs BP (!) 155/115 (BP Location: Left Arm)   Pulse 79   Temp 98 F (36.7 C)   Resp 15   Ht 5\' 4"  (1.626 m)   Wt 103.4 kg   SpO2 99%   BMI 39.14 kg/m   Physical Exam Vitals signs and nursing note reviewed.  Constitutional:      Appearance: She is well-developed.  HENT:     Head: Normocephalic and atraumatic.  Neck:     Musculoskeletal: Normal range of motion and neck supple.  Cardiovascular:     Rate and Rhythm: Normal rate.  Pulmonary:     Effort: Pulmonary effort is normal.  Abdominal:     General: Bowel sounds are normal.  Musculoskeletal:        General: Swelling and tenderness present.     Comments: Patient has edema, erythema and calor over the left great toe in its entirety including the joint.   Skin:    General: Skin is warm and dry.  Neurological:     Mental Status: She is alert and oriented to person, place, and time.      ED Treatments / Results  Labs (all labs ordered are listed, but only abnormal results are displayed) Labs Reviewed - No data to display  EKG None  Radiology No results found.  Procedures Procedures (including critical care time)  Medications Ordered in ED Medications  colchicine tablet 1.2 mg (1.2 mg Oral Given 07/03/18 0845)  oxyCODONE-acetaminophen (PERCOCET/ROXICET) 5-325 MG per tablet 1 tablet (1 tablet Oral Given 07/03/18 0821)  predniSONE (DELTASONE) tablet 60 mg (60 mg Oral Given 07/03/18 0821)  colchicine tablet 0.6 mg (0.6 mg Oral Given 07/03/18 0924)     Initial Impression / Assessment and Plan / ED Course  I have reviewed the triage vital signs and the nursing notes.  Pertinent labs & imaging results that were available during my care of the patient were reviewed by me and considered in my medical decision making (see chart for details).     Patient comes in with chief complaint of toe pain.  She has known history of gout, and it  appears that she is having acute gout flareup based on a history of sudden onset of pain that started yesterday.  Clinically our suspicion is low for cellulitis or septic arthritis.  We will proceed with treatment of her toe pain with medications for gout and recommend follow-up.  Final Clinical Impressions(s) / ED Diagnoses   Final diagnoses:  Acute gout involving toe of left foot, unspecified cause    ED Discharge Orders         Ordered    colchicine 0.6 MG tablet     07/03/18 0851    HYDROcodone-acetaminophen (NORCO/VICODIN) 5-325 MG tablet  Every 8  hours PRN     07/03/18 0851    predniSONE (STERAPRED UNI-PAK 21 TAB) 10 MG (21) TBPK tablet     07/03/18 2683           Derwood Kaplan, MD 07/03/18 [id number]    Derwood Kaplan, MD 07/03/18 1021

## 2018-07-03 NOTE — ED Triage Notes (Signed)
Pt arriving with gout in her left foot. Has medication for such but has nothing for pain control

## 2018-12-17 ENCOUNTER — Other Ambulatory Visit: Payer: Self-pay | Admitting: Family Medicine

## 2019-01-07 ENCOUNTER — Other Ambulatory Visit: Payer: Self-pay

## 2019-01-07 ENCOUNTER — Ambulatory Visit (INDEPENDENT_AMBULATORY_CARE_PROVIDER_SITE_OTHER): Payer: Medicare Other | Admitting: Family Medicine

## 2019-01-07 ENCOUNTER — Encounter: Payer: Self-pay | Admitting: Family Medicine

## 2019-01-07 VITALS — BP 129/80 | HR 70 | Temp 98.4°F | Resp 17 | Ht 64.0 in | Wt 225.6 lb

## 2019-01-07 DIAGNOSIS — Z760 Encounter for issue of repeat prescription: Secondary | ICD-10-CM

## 2019-01-07 DIAGNOSIS — Z0001 Encounter for general adult medical examination with abnormal findings: Secondary | ICD-10-CM

## 2019-01-07 DIAGNOSIS — M1A4721 Other secondary chronic gout, left ankle and foot, with tophus (tophi): Secondary | ICD-10-CM | POA: Diagnosis not present

## 2019-01-07 DIAGNOSIS — Z1239 Encounter for other screening for malignant neoplasm of breast: Secondary | ICD-10-CM

## 2019-01-07 DIAGNOSIS — R7303 Prediabetes: Secondary | ICD-10-CM

## 2019-01-07 DIAGNOSIS — E785 Hyperlipidemia, unspecified: Secondary | ICD-10-CM | POA: Diagnosis not present

## 2019-01-07 DIAGNOSIS — I10 Essential (primary) hypertension: Secondary | ICD-10-CM | POA: Diagnosis not present

## 2019-01-07 DIAGNOSIS — Z0000 Encounter for general adult medical examination without abnormal findings: Secondary | ICD-10-CM

## 2019-01-07 MED ORDER — LISINOPRIL 20 MG PO TABS: 20.0000 mg | ORAL_TABLET | Freq: Every day | ORAL | 1 refills | Status: DC

## 2019-01-07 MED ORDER — ALLOPURINOL 300 MG PO TABS: 300.0000 mg | ORAL_TABLET | Freq: Every day | ORAL | 1 refills | Status: DC

## 2019-01-07 MED ORDER — SERTRALINE HCL 100 MG PO TABS: 100.0000 mg | ORAL_TABLET | Freq: Every day | ORAL | 3 refills | Status: DC

## 2019-01-07 MED ORDER — OXYCODONE-ACETAMINOPHEN 5-325 MG PO TABS: 1.0000 | ORAL_TABLET | Freq: Three times a day (TID) | ORAL | 0 refills | Status: DC | PRN

## 2019-01-07 MED ORDER — COLCHICINE 0.6 MG PO TABS: ORAL_TABLET | ORAL | 0 refills | Status: DC

## 2019-01-07 MED ORDER — INDOMETHACIN 50 MG PO CAPS: 50.0000 mg | ORAL_CAPSULE | Freq: Three times a day (TID) | ORAL | 1 refills | Status: DC | PRN

## 2019-01-07 MED ORDER — SIMVASTATIN 40 MG PO TABS: 40.0000 mg | ORAL_TABLET | Freq: Every day | ORAL | 0 refills | Status: DC

## 2019-01-07 NOTE — Patient Instructions (Addendum)
   If you have lab work done today you will be contacted with your lab results within the next 2 weeks.  If you have not heard from us then please contact us. The fastest way to get your results is to register for My Chart.   IF you received an x-ray today, you will receive an invoice from [facility]. Please contact [facility] at [phone] with questions or concerns regarding your invoice.   IF you received labwork today, you will receive an invoice from [facility]. Please contact [facility] at [phone] with questions or concerns regarding your invoice.   Our billing staff will not be able to assist you with questions regarding bills from these companies.  You will be contacted with the lab results as soon as they are available. The fastest way to get your results is to activate your My Chart account. Instructions are located on the last page of this paperwork. If you have not heard from us regarding the results in 2 weeks, please contact this office.     Breast Self-Awareness Breast self-awareness means being familiar with how your breasts look and feel. It involves checking your breasts regularly and reporting any changes to your health care provider. Practicing breast self-awareness is important. Sometimes changes may not be harmful (are benign), but sometimes a change in your breasts can be a sign of a serious medical problem. It is important to learn how to do this procedure correctly so that you can catch problems early, when treatment is more likely to be successful. All women should practice breast self-awareness, including women who have had breast implants. What you need:  A mirror.  A well-lit room. How to do a breast self-exam A breast self-exam is one way to learn what is normal for your breasts and whether your breasts are changing. To do a breast self-exam: Look for changes  1. Remove all the clothing above your waist. 2. Stand in front of a mirror  in a room with good lighting. 3. Put your hands on your hips. 4. Push your hands firmly downward. 5. Compare your breasts in the mirror. Look for differences between them (asymmetry), such as: ? Differences in shape. ? Differences in size. ? Puckers, dips, and bumps in one breast and not the other. 6. Look at each breast for changes in the skin, such as: ? Redness. ? Scaly areas. 7. Look for changes in your nipples, such as: ? Discharge. ? Bleeding. ? Dimpling. ? Redness. ? A change in position. Feel for changes Carefully feel your breasts for lumps and changes. It is best to do this while lying on your back on the floor, and again while sitting or standing in the tub or shower with soapy water on your skin. Feel each breast in the following way: 1. Place the arm on the side of the breast you are examining above your head. 2. Feel your breast with the other hand. 3. Start in the nipple area and make -inch (2 cm) overlapping circles to feel your breast. Use the pads of your three middle fingers to do this. Apply light pressure, then medium pressure, then firm pressure. The light pressure will allow you to feel the tissue closest to the skin. The medium pressure will allow you to feel the tissue that is a little deeper. The firm pressure will allow you to feel the tissue close to the ribs. 4. Continue the overlapping circles, moving downward over the breast until you feel your   ribs below your breast. 5. Move one finger-width toward the center of the body. Continue to use the -inch (2 cm) overlapping circles to feel your breast as you move slowly up toward your collarbone. 6. Continue the up-and-down exam using all three pressures until you reach your armpit.  Write down what you find Writing down what you find can help you remember what to discuss with your health care provider. Write down:  What is normal for each breast.  Any changes that you find in each breast, including: ? The  kind of changes you find. ? Any pain or tenderness. ? Size and location of any lumps.  Where you are in your menstrual cycle, if you are still menstruating. General tips and recommendations  Examine your breasts every month.  If you are breastfeeding, the best time to examine your breasts is after a feeding or after using a breast pump.  If you menstruate, the best time to examine your breasts is 5-7 days after your period. Breasts are generally lumpier during menstrual periods, and it may be more difficult to notice changes.  With time and practice, you will become more familiar with the variations in your breasts and more comfortable with the exam. Contact a health care provider if you:  See a change in the shape or size of your breasts or nipples.  See a change in the skin of your breast or nipples, such as a reddened or scaly area.  Have unusual discharge from your nipples.  Find a lump or thick area that was not there before.  Have pain in your breasts.  Have any concerns related to your breast health. Summary  Breast self-awareness includes looking for physical changes in your breasts, as well as feeling for any changes within your breasts.  Breast self-awareness should be performed in front of a mirror in a well-lit room.  You should examine your breasts every month. If you menstruate, the best time to examine your breasts is 5-7 days after your menstrual period.  Let your health care provider know of any changes you notice in your breasts, including changes in size, changes on the skin, pain or tenderness, or unusual fluid from your nipples. This information is not intended to replace advice given to you by your health care provider. Make sure you discuss any questions you have with your health care provider. Document Released: 06/06/2005 Document Revised: 01/23/2018 Document Reviewed: 01/23/2018 [employer] Patient Education  2020 [employer]  

## 2019-01-07 NOTE — Progress Notes (Signed)
QUICK REFERENCE INFORMATION: The ABCs of Providing the Annual Wellness Visit  CMS.gov Medicare Learning Network  [employer] Annual Wellness Visit  Subjective:   Christine Hopkins is a 63 y.o. Female who presents for an Annual Wellness Visit.  Hypertension: Patient here for follow-up of elevated blood pressure. She is exercising and is adherent to low salt diet.  Blood pressure is well controlled at home. Cardiac symptoms none. Patient denies chest pain, claudication, dyspnea, fatigue, irregular heart beat, near-syncope and orthopnea.  Cardiovascular risk factors: dyslipidemia, hypertension and obesity (BMI >= 30 kg/m2). Use of agents associated with hypertension: NSAIDS. History of target organ damage: none. BP Readings from Last 3 Encounters:  01/07/19 129/80  07/03/18 (!) 155/115  04/03/18 (!) 150/89     Patient Active Problem List   Diagnosis Date Noted  . History of back surgery 11/10/2016  . Essential hypertension 07/15/2016  . Dyslipidemia 07/15/2016  . Class 2 obesity due to excess calories with serious comorbidity and body mass index (BMI) of 36.0 to 36.9 in adult 07/15/2016  . Adjustment disorder with depressed mood 07/15/2016    Past Medical History:  Diagnosis Date  . Anxiety   . Arthritis   . Depression   . GERD (gastroesophageal reflux disease)   . History of back surgery   . Hyperlipidemia   . Hypertension   . Neuromuscular disorder [facility])      Past Surgical History:  Procedure Laterality Date  . FRACTURE SURGERY    . JOINT REPLACEMENT    . SPINE SURGERY       Outpatient Medications Prior to Visit  Medication Sig Dispense Refill  . ibuprofen (ADVIL,MOTRIN) 800 MG tablet Take 1 tablet (800 mg total) by mouth 3 (three) times daily. 90 tablet 0  . allopurinol (ZYLOPRIM) 100 MG tablet Take 100 mg by mouth daily.    . colchicine 0.6 MG tablet Take 1 tablet bid for the next 5 days 10 tablet 0  . lisinopril (PRINIVIL,ZESTRIL) 20 MG tablet Take 20 mg by mouth  daily.  3  . oxyCODONE-acetaminophen (PERCOCET/ROXICET) 5-325 MG tablet Take 1 tablet by mouth every 8 (eight) hours as needed for severe pain. 6 tablet 0  . predniSONE (STERAPRED UNI-PAK 21 TAB) 10 MG (21) TBPK tablet Use as directed 21 tablet 0  . sertraline (ZOLOFT) 100 MG tablet Take 1 tablet (100 mg total) by mouth daily. 90 tablet 2  . simvastatin (ZOCOR) 40 MG tablet Take 1 tablet (40 mg total) by mouth daily. 90 tablet 0  . allopurinol (ZYLOPRIM) 300 MG tablet Take 300 mg by mouth daily.    . indomethacin (INDOCIN) 50 MG capsule Take 50 mg by mouth daily.    Marland Kitchen lisinopril-hydrochlorothiazide (PRINZIDE,ZESTORETIC) 10-12.5 MG tablet Take 1 tablet by mouth daily. (Patient not taking: Reported on 01/07/2019) 90 tablet 0   No facility-administered medications prior to visit.     Allergies  Allergen Reactions  . Prozac [Fluoxetine Hcl] Hives     Family History  Problem Relation Age of Onset  . Depression Father   . Mental illness Sister   . Depression Sister   . Schizophrenia Sister   . Diabetes Brother   . Hyperlipidemia Brother   . Mental illness Brother   . Depression Brother   . Schizophrenia Brother   . Heart disease Brother   . Diabetes Brother   . Diabetes Maternal Grandmother   . Heart disease Maternal Grandfather   . Heart disease Paternal Grandmother      Social History  Socioeconomic History  . Marital status: Single    Spouse name: Not on file  . Number of children: Not on file  . Years of education: Not on file  . Highest education level: Not on file  Occupational History  . Not on file  Social Needs  . Financial resource strain: Not on file  . Food insecurity    Worry: Not on file    Inability: Not on file  . Transportation needs    Medical: Not on file    Non-medical: Not on file  Tobacco Use  . Smoking status: Never Smoker  . Smokeless tobacco: Never Used  Substance and Sexual Activity  . Alcohol use: No  . Drug use: No  . Sexual activity:  Not Currently  Lifestyle  . Physical activity    Days per week: Not on file    Minutes per session: Not on file  . Stress: Not on file  Relationships  . Social Herbalist on phone: Not on file    Gets together: Not on file    Attends religious service: Not on file    Active member of club or organization: Not on file    Attends meetings of clubs or organizations: Not on file    Relationship status: Not on file  Other Topics Concern  . Not on file  Social History Narrative  . Not on file    Recent Hospitalizations? Yes  Health Habits: Current exercise activities include: walking Exercise: 4 times/week. Diet: in general, a "healthy" diet    Alcohol intake: none  Health Risk Assessment: The patient has completed a Health Risk Assessment. This has been reveiwed with them and has been scanned into the [place] system as an attached document.  Current Medical Providers and Suppliers: [facility] Patient Care Team: Forrest Moron, MD as PCP - General (Internal Medicine) No future appointments.   Age-appropriate Screening Schedule: The list below includes current immunization status and future screening recommendations based on patient's age. Orders for these recommended tests are listed in the plan section. The patient has been provided with a written plan. Immunization History  Administered Date(s) Administered  . Influenza-Unspecified 03/08/2016  . Tdap 09/13/2017    Health Maintenance reviewed -  Patient declined mammogram and cologuard as she had a bad experience.   Depression Screen-PHQ2/9 completed today  Depression screen [facility] 2/9 01/07/2019 09/13/2017 09/13/2017 07/15/2016  Decreased Interest 0 0 0 0  Down, Depressed, Hopeless 0 0 0 1  PHQ - 2 Score 0 0 0 1     Depression Severity and Treatment Recommendations:  0-4= None  5-9= Mild / Treatment: Support, educate to call if worse; return in one month  10-14= Moderate / Treatment: Support, watchful waiting;  Antidepressant or Psycotherapy  15-19= Moderately severe / Treatment: Antidepressant OR Psychotherapy  >= 20 = Major depression, severe / Antidepressant AND Psychotherapy  Functional Status Survey:   Is the patient deaf or have difficulty hearing?: No Does the patient have difficulty seeing, even when wearing glasses/contacts?: No Does the patient have difficulty concentrating, remembering, or making decisions?: No Does the patient have difficulty walking or climbing stairs?: No Does the patient have difficulty dressing or bathing?: No Does the patient have difficulty doing errands alone such as visiting a doctor's office or shopping?: No   Advanced Care Planning: 1. Patient has executed an Advance Directive: No 2. If no, patient was given the opportunity to execute an Advance Directive today? Yes 3. Are the  patient's advanced directives in [place]? No 4. This patient has the ability to prepare an Advance Directive: Yes 5. Provider is willing to follow the patient's wishes: Yes  Cognitive Assessment: Does the patient have evidence of cognitive impairment? No The patient does not have any evidence of any cognitive problems and denies any  change in mood/affect, appearance, speech, memory or motor skills.  Identification of Risk Factors: Risk factors include: hyperlipidemia and hypertension  ROS   Review of Systems  Constitutional: Negative for activity change, appetite change, chills and fever.  HENT: Negative for congestion, nosebleeds, trouble swallowing and voice change.   Respiratory: Negative for cough, shortness of breath and wheezing.   Gastrointestinal: Negative for diarrhea, nausea and vomiting. No brbpr, no melena Genitourinary: Negative for difficulty urinating, dysuria, flank pain and hematuria.  Neurological: Negative for dizziness, speech difficulty, light-headedness and numbness.  See HPI. All other review of systems negative.   Objective:   Vitals:   01/07/19  1625  BP: 129/80  Pulse: 70  Resp: 17  Temp: 98.4 F (36.9 C)  TempSrc: Oral  SpO2: 95%  Weight: 225 lb 9.6 oz (102.3 kg)  Height: '5\' 4"'  (1.626 m)   Wt Readings from Last 3 Encounters:  01/07/19 225 lb 9.6 oz (102.3 kg)  07/03/18 228 lb (103.4 kg)  03/20/18 219 lb (99.3 kg)     Body mass index is 38.72 kg/m.  BP 129/80 (BP Location: Left Arm, Patient Position: Sitting, Cuff Size: Large)   Pulse 70   Temp 98.4 F (36.9 C) (Oral)   Resp 17   Ht '5\' 4"'  (1.626 m)   Wt 225 lb 9.6 oz (102.3 kg)   SpO2 95%   BMI 38.72 kg/m   General Appearance:    Alert, cooperative, no distress, appears stated age  Head:    Normocephalic, without obvious abnormality, atraumatic  Eyes:    PERRL, conjunctiva/corneas clear, EOM's intact  Nose:   Nares normal, septum midline, mucosa normal, no drainage    or sinus tenderness  Throat:   Lips, mucosa, and tongue normal; teeth and gums normal  Neck:   Supple, symmetrical, trachea midline, no adenopathy;    thyroid:  no enlargement/tenderness/nodules  Back:     Symmetric, no curvature, ROM normal, no CVA tenderness  Lungs:     Clear to auscultation bilaterally, respirations unlabored  Chest Wall:    No tenderness or deformity   Heart:    Regular rate and rhythm, S1 and S2 normal, no murmur, rub   or gallop  Breast Exam:    No tenderness, masses, or nipple abnormality, skin changes   Abdomen:     Soft, non-tender, bowel sounds active all four quadrants,    no masses, no organomegaly  Extremities:   Extremities normal, atraumatic, no cyanosis or edema  Pulses:   2+ and symmetric all extremities  Skin:   Skin color, texture, turgor normal, no rashes or lesions  Lymph nodes:   Cervical, supraclavicular, and axillary nodes normal  Neurologic:   CNII-XII intact, normal strength, sensation and reflexes    throughout     Assessment/Plan:   Patient Self-Management and Personalized Health Advice The patient has been provided with information about:   attempt to lose weight, reduce exposure to stress, use calcium 1 gram daily with Vit D, continue current medications and return for routine annual checkups  During the course of the visit the patient was educated and counseled about appropriate screening and preventive services including:  Body mass index is 38.72 kg/m. Discussed the patient's BMI with her. The BMI BMI is not in the acceptable range; BMI management plan is completed  Chelise was seen today for annual exam and medication refill.  Diagnoses and all orders for this visit:  Encounter for health maintenance examination in adult- pt declines colonoscopy because she would not want to know if she had cancer Discussed cologuard and patient just does not want to know right now  Screening breast examination- pt declines mammogram at this time, normal CBE Offered counseling on the importance of early detection   Medication refill- discussed med refills   Essential hypertension- Patient's blood pressure is at goal of 139/89 or less. Condition is stable. Continue current medications and treatment plan. I recommend that you exercise for 30-45 minutes 5 days a week. I also recommend a balanced diet with fruits and vegetables every day, lean meats, and little fried foods. The DASH diet (you can find this online) is a good example of this.  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 -     allopurinol (ZYLOPRIM) 300 MG tablet; Take 1 tablet (300 mg total) by mouth daily. -     simvastatin (ZOCOR) 40 MG tablet; Take 1 tablet (40 mg  total) by mouth daily. -     indomethacin (INDOCIN) 50 MG capsule; Take 1 capsule (50 mg total) by mouth 3 (three) times daily as needed. -     sertraline (ZOLOFT) 100 MG tablet; Take 1 tablet (100 mg total) by mouth daily. -     oxyCODONE-acetaminophen (PERCOCET/ROXICET) 5-325 MG tablet; Take 1 tablet by mouth every 8 (eight) hours as needed for severe pain. -     colchicine 0.6 MG tablet; Take 1 tablet bid for the 5 days for gout attack      Return in about 6 months (around 07/10/2019) for gout and allopurinol.  No future appointments.  Patient Instructions       If you have lab work done today you will be contacted with your lab results within the next 2 weeks.  If you have not heard from [place] then please contact us. The fastest way to get your results is to register for My Chart.   IF you received an x-ray today, you will receive an invoice from [facility]. Please contact [facility] at [phone] with questions or concerns regarding your invoice.   IF you received labwork today, you will receive an invoice from [facility]. Please contact [facility] at [phone] with questions or concerns regarding your invoice.   Our billing staff will not be able to assist you with questions regarding bills from these companies.  You will be contacted with the lab results as soon as they are available. The fastest way to get your results is to activate your My Chart account. Instructions are located on the last page of this paperwork. If you have not heard  from [place] regarding the results in 2 weeks, please contact this office.       An after visit summary with all of these plans was given to the patient.

## 2019-01-08 LAB — CMP14+EGFR
ALT: 13 IU/L (ref 0–32)
AST: 17 IU/L (ref 0–40)
Albumin/Globulin Ratio: 1.8 (ref 1.2–2.2)
Albumin: 4.2 g/dL (ref 3.8–4.8)
Alkaline Phosphatase: 78 IU/L (ref 39–117)
BUN/Creatinine Ratio: 17 (ref 12–28)
BUN: 13 mg/dL (ref 8–27)
Bilirubin Total: <0.2 mg/dL (ref 0.0–1.2)
CO2: 23 mmol/L (ref 20–29)
Calcium: 10.2 mg/dL (ref 8.7–10.3)
Chloride: 105 mmol/L (ref 96–106)
Creatinine, Ser: 0.75 mg/dL (ref 0.57–1.00)
GFR calc Af Amer: 98 mL/min/{1.73_m2} (ref >59)
GFR calc non Af Amer: 85 mL/min/{1.73_m2} (ref >59)
Globulin, Total: 2.4 g/dL (ref 1.5–4.5)
Glucose: 114 mg/dL — ABNORMAL HIGH (ref 65–99)
Potassium: 4.5 mmol/L (ref 3.5–5.2)
Sodium: 145 mmol/L — ABNORMAL HIGH (ref 134–144)
Total Protein: 6.6 g/dL (ref 6.0–8.5)

## 2019-01-08 LAB — LIPID PANEL
Chol/HDL Ratio: 5.1 ratio — ABNORMAL HIGH (ref 0.0–4.4)
Cholesterol, Total: 281 mg/dL — ABNORMAL HIGH (ref 100–199)
HDL: 55 mg/dL (ref >39)
LDL Calculated: 186 mg/dL — ABNORMAL HIGH (ref 0–99)
Triglycerides: 199 mg/dL — ABNORMAL HIGH (ref 0–149)
VLDL Cholesterol Cal: 40 mg/dL (ref 5–40)

## 2019-01-08 LAB — TSH: TSH: 2.48 u[IU]/mL (ref 0.450–4.500)

## 2019-01-08 LAB — CBC
Hematocrit: 37.8 % (ref 34.0–46.6)
Hemoglobin: 12.3 g/dL (ref 11.1–15.9)
MCH: 26.7 pg (ref 26.6–33.0)
MCHC: 32.5 g/dL (ref 31.5–35.7)
MCV: 82 fL (ref 79–97)
Platelets: 338 10*3/uL (ref 150–450)
RBC: 4.61 x10E6/uL (ref 3.77–5.28)
RDW: 15.6 % — ABNORMAL HIGH (ref 11.7–15.4)
WBC: 7.8 10*3/uL (ref 3.4–10.8)

## 2019-01-08 LAB — MICROALBUMIN, URINE: Microalbumin, Urine: 8.9 ug/mL

## 2019-01-08 LAB — HEMOGLOBIN A1C
Est. average glucose Bld gHb Est-mCnc: 148 mg/dL
Hgb A1c MFr Bld: 6.8 % — ABNORMAL HIGH (ref 4.8–5.6)

## 2019-02-18 ENCOUNTER — Ambulatory Visit (INDEPENDENT_AMBULATORY_CARE_PROVIDER_SITE_OTHER): Payer: Medicare Other

## 2019-02-18 ENCOUNTER — Other Ambulatory Visit: Payer: Self-pay

## 2019-02-18 ENCOUNTER — Encounter: Payer: Self-pay | Admitting: Emergency Medicine

## 2019-02-18 ENCOUNTER — Ambulatory Visit (INDEPENDENT_AMBULATORY_CARE_PROVIDER_SITE_OTHER): Payer: Medicare Other | Admitting: Emergency Medicine

## 2019-02-18 VITALS — BP 151/84 | HR 62 | Temp 98.6°F | Resp 16 | Ht 64.0 in | Wt 221.4 lb

## 2019-02-18 DIAGNOSIS — E1165 Type 2 diabetes mellitus with hyperglycemia: Secondary | ICD-10-CM | POA: Diagnosis not present

## 2019-02-18 DIAGNOSIS — Z111 Encounter for screening for respiratory tuberculosis: Secondary | ICD-10-CM

## 2019-02-18 LAB — GLUCOSE, POCT (MANUAL RESULT ENTRY): POC Glucose: 102 mg/dl — AB (ref 70–99)

## 2019-02-18 LAB — POCT GLYCOSYLATED HEMOGLOBIN (HGB A1C): Hemoglobin A1C: 7 % — AB (ref 4.0–5.6)

## 2019-02-18 MED ORDER — METFORMIN HCL 1000 MG PO TABS: 1000.0000 mg | ORAL_TABLET | Freq: Two times a day (BID) | ORAL | 3 refills | Status: DC

## 2019-02-18 NOTE — Patient Instructions (Addendum)
   If you have lab work done today you will be contacted with your lab results within the next 2 weeks.  If you have not heard from us then please contact us. The fastest way to get your results is to register for My Chart.   IF you received an x-ray today, you will receive an invoice from [facility]. Please contact [facility] at [phone] with questions or concerns regarding your invoice.   IF you received labwork today, you will receive an invoice from [facility]. Please contact [facility] at [phone] with questions or concerns regarding your invoice.   Our billing staff will not be able to assist you with questions regarding bills from these companies.  You will be contacted with the lab results as soon as they are available. The fastest way to get your results is to activate your My Chart account. Instructions are located on the last page of this paperwork. If you have not heard from us regarding the results in 2 weeks, please contact this office.     Diabetes Mellitus and Nutrition, Adult When you have diabetes (diabetes mellitus), it is very important to have healthy eating habits because your blood sugar (glucose) levels are greatly affected by what you eat and drink. Eating healthy foods in the appropriate amounts, at about the same times every day, can help you:  Control your blood glucose.  Lower your risk of heart disease.  Improve your blood pressure.  Reach or maintain a healthy weight. Every person with diabetes is different, and each person has different needs for a meal plan. Your health care provider may recommend that you work with a diet and nutrition specialist (dietitian) to make a meal plan that is best for you. Your meal plan may vary depending on factors such as:  The calories you need.  The medicines you take.  Your weight.  Your blood glucose, blood pressure, and cholesterol levels.  Your activity level.  Other health conditions  you have, such as heart or kidney disease. How do carbohydrates affect me? Carbohydrates, also called carbs, affect your blood glucose level more than any other type of food. Eating carbs naturally raises the amount of glucose in your blood. Carb counting is a method for keeping track of how many carbs you eat. Counting carbs is important to keep your blood glucose at a healthy level, especially if you use insulin or take certain oral diabetes medicines. It is important to know how many carbs you can safely have in each meal. This is different for every person. Your dietitian can help you calculate how many carbs you should have at each meal and for each snack. Foods that contain carbs include:  Bread, cereal, rice, pasta, and crackers.  Potatoes and corn.  Peas, beans, and lentils.  Milk and yogurt.  Fruit and juice.  Desserts, such as cakes, cookies, ice cream, and candy. How does alcohol affect me? Alcohol can cause a sudden decrease in blood glucose (hypoglycemia), especially if you use insulin or take certain oral diabetes medicines. Hypoglycemia can be a life-threatening condition. Symptoms of hypoglycemia (sleepiness, dizziness, and confusion) are similar to symptoms of having too much alcohol. If your health care provider says that alcohol is safe for you, follow these guidelines:  Limit alcohol intake to no more than 1 drink per day for nonpregnant women and 2 drinks per day for men. One drink equals 12 oz of beer, 5 oz of wine, or 1 oz of hard liquor.    Do not drink on an empty stomach.  Keep yourself hydrated with water, diet soda, or unsweetened iced tea.  Keep in mind that regular soda, juice, and other mixers may contain a lot of sugar and must be counted as carbs. What are tips for following this plan?  Reading food labels  Start by checking the serving size on the "Nutrition Facts" label of packaged foods and drinks. The amount of calories, carbs, fats, and other  nutrients listed on the label is based on one serving of the item. Many items contain more than one serving per package.  Check the total grams (g) of carbs in one serving. You can calculate the number of servings of carbs in one serving by dividing the total carbs by 15. For example, if a food has 30 g of total carbs, it would be equal to 2 servings of carbs.  Check the number of grams (g) of saturated and trans fats in one serving. Choose foods that have low or no amount of these fats.  Check the number of milligrams (mg) of salt (sodium) in one serving. Most people should limit total sodium intake to less than 2,300 mg per day.  Always check the nutrition information of foods labeled as "low-fat" or "nonfat". These foods may be higher in added sugar or refined carbs and should be avoided.  Talk to your dietitian to identify your daily goals for nutrients listed on the label. Shopping  Avoid buying canned, premade, or processed foods. These foods tend to be high in fat, sodium, and added sugar.  Shop around the outside edge of the grocery store. This includes fresh fruits and vegetables, bulk grains, fresh meats, and fresh dairy. Cooking  Use low-heat cooking methods, such as baking, instead of high-heat cooking methods like deep frying.  Cook using healthy oils, such as olive, canola, or sunflower oil.  Avoid cooking with butter, cream, or high-fat meats. Meal planning  Eat meals and snacks regularly, preferably at the same times every day. Avoid going long periods of time without eating.  Eat foods high in fiber, such as fresh fruits, vegetables, beans, and whole grains. Talk to your dietitian about how many servings of carbs you can eat at each meal.  Eat 4-6 ounces (oz) of lean protein each day, such as lean meat, chicken, fish, eggs, or tofu. One oz of lean protein is equal to: ? 1 oz of meat, chicken, or fish. ? 1 egg. ?  cup of tofu.  Eat some foods each day that contain  healthy fats, such as avocado, nuts, seeds, and fish. Lifestyle  Check your blood glucose regularly.  Exercise regularly as told by your health care provider. This may include: ? 150 minutes of moderate-intensity or vigorous-intensity exercise each week. This could be brisk walking, biking, or water aerobics. ? Stretching and doing strength exercises, such as yoga or weightlifting, at least 2 times a week.  Take medicines as told by your health care provider.  Do not use any products that contain nicotine or tobacco, such as cigarettes and e-cigarettes. If you need help quitting, ask your health care provider.  Work with a counselor or diabetes educator to identify strategies to manage stress and any emotional and social challenges. Questions to ask a health care provider  Do I need to meet with a diabetes educator?  Do I need to meet with a dietitian?  What number can I call if I have questions?  When are the best times to   check my blood glucose? Where to find more information:  American Diabetes Association: diabetes.org  Academy of Nutrition and Dietetics: www.eatright.org  National Institute of Diabetes and Digestive and Kidney Diseases (NIH): www.niddk.nih.gov Summary  A healthy meal plan will help you control your blood glucose and maintain a healthy lifestyle.  Working with a diet and nutrition specialist (dietitian) can help you make a meal plan that is best for you.  Keep in mind that carbohydrates (carbs) and alcohol have immediate effects on your blood glucose levels. It is important to count carbs and to use alcohol carefully. This information is not intended to replace advice given to you by your health care provider. Make sure you discuss any questions you have with your health care provider. Document Released: 03/03/2005 Document Revised: 05/19/2017 Document Reviewed: 07/11/2016 Elsevier Patient Education  2020 [employer]  

## 2019-02-18 NOTE — Progress Notes (Signed)
Christine Hopkins 63 y.o.   Chief Complaint  Patient presents with  . Leg Pain    per patient both legs that started on Saturday  . Chills    started on Saturday    HISTORY OF PRESENT ILLNESS: This is a 63 y.o. female complaining of pain and weakness to both legs 2 days ago.  Feeling tired with sore legs.  Shaky and cold.  Concerned about recent elevation of hemoglobin A1c. Lab Results  Component Value Date   HGBA1C 6.8 (H) 01/07/2019   Also says that she is PPD positive and needs chest x-ray as requested from work. No other complaints or medical concerns today.  HPI   Prior to Admission medications   Medication Sig Start Date End Date Taking? Authorizing Provider  allopurinol (ZYLOPRIM) 300 MG tablet Take 1 tablet (300 mg total) by mouth daily. 01/07/19  Yes Doristine Bosworth, MD  colchicine 0.6 MG tablet Take 1 tablet bid for the 5 days for gout attack 01/07/19  Yes Collie Siad A, MD  ibuprofen (ADVIL,MOTRIN) 800 MG tablet Take 1 tablet (800 mg total) by mouth 3 (three) times daily. 09/13/17  Yes Doristine Bosworth, MD  indomethacin (INDOCIN) 50 MG capsule Take 1 capsule (50 mg total) by mouth 3 (three) times daily as needed. 01/07/19  Yes Stallings, Zoe A, MD  lisinopril (ZESTRIL) 20 MG tablet Take 1 tablet (20 mg total) by mouth daily. 01/07/19  Yes Doristine Bosworth, MD  oxyCODONE-acetaminophen (PERCOCET/ROXICET) 5-325 MG tablet Take 1 tablet by mouth every 8 (eight) hours as needed for severe pain. 01/07/19  Yes Collie Siad A, MD  sertraline (ZOLOFT) 100 MG tablet Take 1 tablet (100 mg total) by mouth daily. 01/07/19  Yes Collie Siad A, MD  simvastatin (ZOCOR) 40 MG tablet Take 1 tablet (40 mg total) by mouth daily. 01/07/19  Yes Doristine Bosworth, MD    Allergies  Allergen Reactions  . Prozac [Fluoxetine Hcl] Hives    Patient Active Problem List   Diagnosis Date Noted  . History of back surgery 11/10/2016  . Essential hypertension 07/15/2016  . Dyslipidemia 07/15/2016   . Class 2 obesity due to excess calories with serious comorbidity and body mass index (BMI) of 36.0 to 36.9 in adult 07/15/2016  . Adjustment disorder with depressed mood 07/15/2016    Past Medical History:  Diagnosis Date  . Anxiety   . Arthritis   . Depression   . GERD (gastroesophageal reflux disease)   . History of back surgery   . Hyperlipidemia   . Hypertension   . Neuromuscular disorder [facility])     Past Surgical History:  Procedure Laterality Date  . FRACTURE SURGERY    . JOINT REPLACEMENT    . SPINE SURGERY      Social History   Socioeconomic History  . Marital status: Single    Spouse name: Not on file  . Number of children: Not on file  . Years of education: Not on file  . Highest education level: Not on file  Occupational History  . Not on file  Social Needs  . Financial resource strain: Not on file  . Food insecurity    Worry: Not on file    Inability: Not on file  . Transportation needs    Medical: Not on file    Non-medical: Not on file  Tobacco Use  . Smoking status: Never Smoker  . Smokeless tobacco: Never Used  Substance and Sexual Activity  . Alcohol use: No  .  Drug use: No  . Sexual activity: Not Currently  Lifestyle  . Physical activity    Days per week: Not on file    Minutes per session: Not on file  . Stress: Not on file  Relationships  . Social Musicianconnections    Talks on phone: Not on file    Gets together: Not on file    Attends religious service: Not on file    Active member of club or organization: Not on file    Attends meetings of clubs or organizations: Not on file    Relationship status: Not on file  . Intimate partner violence    Fear of current or ex partner: Not on file    Emotionally abused: Not on file    Physically abused: Not on file    Forced sexual activity: Not on file  Other Topics Concern  . Not on file  Social History Narrative  . Not on file    Family History  Problem Relation Age of Onset  . Depression  Father   . Mental illness Sister   . Depression Sister   . Schizophrenia Sister   . Diabetes Brother   . Hyperlipidemia Brother   . Mental illness Brother   . Depression Brother   . Schizophrenia Brother   . Heart disease Brother   . Diabetes Brother   . Diabetes Maternal Grandmother   . Heart disease Maternal Grandfather   . Heart disease Paternal Grandmother      Review of Systems  Constitutional: Positive for chills. Negative for fever.  HENT: Negative.  Negative for congestion and sore throat.   Eyes: Negative.   Respiratory: Negative.  Negative for cough and shortness of breath.   Cardiovascular: Negative.  Negative for chest pain and palpitations.  Gastrointestinal: Negative.  Negative for abdominal pain, diarrhea, nausea and vomiting.  Genitourinary: Negative.  Negative for dysuria and hematuria.  Musculoskeletal: Positive for myalgias.  Skin: Negative.  Negative for rash.  Neurological: Negative for dizziness and headaches.  Endo/Heme/Allergies: Negative.   All other systems reviewed and are negative.   Vitals:   02/18/19 1114  BP: (!) 151/84  Pulse: 62  Resp: 16  Temp: 98.6 F (37 C)  SpO2: 95%    Physical Exam Constitutional:      Appearance: Normal appearance.  HENT:     Head: Normocephalic and atraumatic.  Eyes:     Extraocular Movements: Extraocular movements intact.     Conjunctiva/sclera: Conjunctivae normal.     Pupils: Pupils are equal, round, and reactive to light.  Neck:     Musculoskeletal: Normal range of motion and neck supple.  Cardiovascular:     Rate and Rhythm: Normal rate and regular rhythm.     Heart sounds: Normal heart sounds.  Pulmonary:     Effort: Pulmonary effort is normal.     Breath sounds: Normal breath sounds.  Musculoskeletal: Normal range of motion.        General: No swelling or tenderness.     Right lower leg: No edema.     Left lower leg: No edema.     Comments: Lower extremities: No swelling or tenderness.   Warm to touch.  Good distal circulation.  Good distal pulses and capillary refill.  Within normal limits.  Skin:    General: Skin is warm and dry.     Capillary Refill: Capillary refill takes less than 2 seconds.  Neurological:     General: No focal deficit present.  Mental Status: She is alert and oriented to person, place, and time.  Psychiatric:        Mood and Affect: Mood normal.        Behavior: Behavior normal.    Results for orders placed or performed in visit on 02/18/19 (from the past 24 hour(s))  POCT glucose (manual entry)     Status: Abnormal   Collection Time: 02/18/19 12:06 PM  Result Value Ref Range   POC Glucose 102 (A) 70 - 99 mg/dl  POCT glycosylated hemoglobin (Hb A1C)     Status: Abnormal   Collection Time: 02/18/19 12:16 PM  Result Value Ref Range   Hemoglobin A1C 7.0 (A) 4.0 - 5.6 %   HbA1c POC (<> result, manual entry)     HbA1c, POC (prediabetic range)     HbA1c, POC (controlled diabetic range)     Dg Chest 2 View  Result Date: 02/18/2019 CLINICAL DATA:  Screening for TB. EXAM: CHEST - 2 VIEW COMPARISON:  No prior. FINDINGS: Mediastinum and hilar structures normal. Mild cardiomegaly. No pulmonary venous congestion. Very mild right mid and upper lung pleural-parenchymal thickening noted suggesting scarring. No focal alveolar infiltrate. No pleural effusion or pneumothorax. Degenerative change thoracic spine. IMPRESSION: Very mild right mid and upper lung pleural-parenchymal thickening noted suggesting scarring. No focal alveolar infiltrate identified. Electronically Signed   By: Marcello Moores  Register   On: 02/18/2019 11:52     ASSESSMENT & PLAN: Type 2 diabetes mellitus with hyperglycemia, without long-term current use of insulin (HCC) Hemoglobin A1c higher than before at 7.0.  Will start metformin 1000 mg twice a day along with diet and nutrition.  Follow-up with Dr. Nolon Rod in 3 months.  Sandeep was seen today for leg pain and chills.  Diagnoses and all  orders for this visit:  Type 2 diabetes mellitus with hyperglycemia, without long-term current use of insulin (HCC) -     POCT glucose (manual entry) -     POCT glycosylated hemoglobin (Hb A1C) -     metFORMIN (GLUCOPHAGE) 1000 MG tablet; Take 1 tablet (1,000 mg total) by mouth 2 (two) times daily with a meal.  Screening-pulmonary TB -     DG Chest 2 View; Future    Patient Instructions       If you have lab work done today you will be contacted with your lab results within the next 2 weeks.  If you have not heard from [place] then please contact us. The fastest way to get your results is to register for My Chart.   IF you received an x-ray today, you will receive an invoice from [facility]. Please contact [facility] at [phone] with questions or concerns regarding your invoice.   IF you received labwork today, you will receive an invoice from [facility]. Please contact [facility] at [phone] with questions or concerns regarding your invoice.   Our billing staff will not be able to assist you with questions regarding bills from these companies.  You will be contacted with the lab results as soon as they are available. The fastest way to get your results is to activate your My Chart account. Instructions are located on the last page of this paperwork. If you have not heard from [place] regarding the results in 2 weeks, please contact this office.     Diabetes Mellitus and Nutrition, Adult When you have diabetes (diabetes mellitus), it is very important to have healthy eating habits because your blood sugar (glucose) levels are greatly affected by  what you eat and drink. Eating healthy foods in the appropriate amounts, at about the same times every day, can help you:  Control your blood glucose.  Lower your risk of heart disease.  Improve your blood pressure.  Reach or maintain a healthy weight. Every person with diabetes is different, and each person has  different needs for a meal plan. Your health care provider may recommend that you work with a diet and nutrition specialist (dietitian) to make a meal plan that is best for you. Your meal plan may vary depending on factors such as:  The calories you need.  The medicines you take.  Your weight.  Your blood glucose, blood pressure, and cholesterol levels.  Your activity level.  Other health conditions you have, such as heart or kidney disease. How do carbohydrates affect me? Carbohydrates, also called carbs, affect your blood glucose level more than any other type of food. Eating carbs naturally raises the amount of glucose in your blood. Carb counting is a method for keeping track of how many carbs you eat. Counting carbs is important to keep your blood glucose at a healthy level, especially if you use insulin or take certain oral diabetes medicines. It is important to know how many carbs you can safely have in each meal. This is different for every person. Your dietitian can help you calculate how many carbs you should have at each meal and for each snack. Foods that contain carbs include:  Bread, cereal, rice, pasta, and crackers.  Potatoes and corn.  Peas, beans, and lentils.  Milk and yogurt.  Fruit and juice.  Desserts, such as cakes, cookies, ice cream, and candy. How does alcohol affect me? Alcohol can cause a sudden decrease in blood glucose (hypoglycemia), especially if you use insulin or take certain oral diabetes medicines. Hypoglycemia can be a life-threatening condition. Symptoms of hypoglycemia (sleepiness, dizziness, and confusion) are similar to symptoms of having too much alcohol. If your health care provider says that alcohol is safe for you, follow these guidelines:  Limit alcohol intake to no more than 1 drink per day for nonpregnant women and 2 drinks per day for men. One drink equals 12 oz of beer, 5 oz of wine, or 1 oz of hard liquor.  Do not drink on an  empty stomach.  Keep yourself hydrated with water, diet soda, or unsweetened iced tea.  Keep in mind that regular soda, juice, and other mixers may contain a lot of sugar and must be counted as carbs. What are tips for following this plan?  Reading food labels  Start by checking the serving size on the "Nutrition Facts" label of packaged foods and drinks. The amount of calories, carbs, fats, and other nutrients listed on the label is based on one serving of the item. Many items contain more than one serving per package.  Check the total grams (g) of carbs in one serving. You can calculate the number of servings of carbs in one serving by dividing the total carbs by 15. For example, if a food has 30 g of total carbs, it would be equal to 2 servings of carbs.  Check the number of grams (g) of saturated and trans fats in one serving. Choose foods that have low or no amount of these fats.  Check the number of milligrams (mg) of salt (sodium) in one serving. Most people should limit total sodium intake to less than 2,300 mg per day.  Always check the nutrition information of  foods labeled as "low-fat" or "nonfat". These foods may be higher in added sugar or refined carbs and should be avoided.  Talk to your dietitian to identify your daily goals for nutrients listed on the label. Shopping  Avoid buying canned, premade, or processed foods. These foods tend to be high in fat, sodium, and added sugar.  Shop around the outside edge of the grocery store. This includes fresh fruits and vegetables, bulk grains, fresh meats, and fresh dairy. Cooking  Use low-heat cooking methods, such as baking, instead of high-heat cooking methods like deep frying.  Cook using healthy oils, such as olive, canola, or sunflower oil.  Avoid cooking with butter, cream, or high-fat meats. Meal planning  Eat meals and snacks regularly, preferably at the same times every day. Avoid going long periods of time without  eating.  Eat foods high in fiber, such as fresh fruits, vegetables, beans, and whole grains. Talk to your dietitian about how many servings of carbs you can eat at each meal.  Eat 4-6 ounces (oz) of lean protein each day, such as lean meat, chicken, fish, eggs, or tofu. One oz of lean protein is equal to: ? 1 oz of meat, chicken, or fish. ? 1 egg. ?  cup of tofu.  Eat some foods each day that contain healthy fats, such as avocado, nuts, seeds, and fish. Lifestyle  Check your blood glucose regularly.  Exercise regularly as told by your health care provider. This may include: ? 150 minutes of moderate-intensity or vigorous-intensity exercise each week. This could be brisk walking, biking, or water aerobics. ? Stretching and doing strength exercises, such as yoga or weightlifting, at least 2 times a week.  Take medicines as told by your health care provider.  Do not use any products that contain nicotine or tobacco, such as cigarettes and e-cigarettes. If you need help quitting, ask your health care provider.  Work with a Veterinary surgeoncounselor or diabetes educator to identify strategies to manage stress and any emotional and social challenges. Questions to ask a health care provider  Do I need to meet with a diabetes educator?  Do I need to meet with a dietitian?  What number can I call if I have questions?  When are the best times to check my blood glucose? Where to find more information:  American Diabetes Association: diabetes.org  Academy of Nutrition and Dietetics: www.eatright.[employer]org  National Institute of Diabetes and Digestive and Kidney Diseases (NIH): [employer].tnwww.niddk.nih.gov Summary  A healthy meal plan will help you control your blood glucose and maintain a healthy lifestyle.  Working with a diet and nutrition specialist (dietitian) can help you make a meal plan that is best for you.  Keep in mind that carbohydrates (carbs) and alcohol have immediate effects on your blood glucose  levels. It is important to count carbs and to use alcohol carefully. This information is not intended to replace advice given to you by your health care provider. Make sure you discuss any questions you have with your health care provider. Document Released: 03/03/2005 Document Revised: 05/19/2017 Document Reviewed: 07/11/2016 Elsevier Patient Education  2020 Elsevier Inc.      Edwina BarthMiguel Ciji Boston, MD [facility]

## 2019-02-18 NOTE — Assessment & Plan Note (Signed)
Hemoglobin A1c higher than before at 7.0.  Will start metformin 1000 mg twice a day along with diet and nutrition.  Follow-up with Dr. Nolon Rod in 3 months.

## 2019-03-28 ENCOUNTER — Encounter: Payer: Self-pay | Admitting: Family Medicine

## 2019-03-28 ENCOUNTER — Telehealth: Payer: Self-pay | Admitting: Family Medicine

## 2019-04-02 ENCOUNTER — Encounter: Payer: Self-pay | Admitting: Family Medicine

## 2019-04-03 ENCOUNTER — Telehealth: Payer: BLUE CROSS/BLUE SHIELD

## 2019-04-03 NOTE — Telephone Encounter (Signed)
Addressed in separate message

## 2019-04-05 ENCOUNTER — Other Ambulatory Visit: Payer: Self-pay

## 2019-04-05 ENCOUNTER — Encounter: Payer: Self-pay | Admitting: Adult Health Nurse Practitioner

## 2019-04-05 ENCOUNTER — Telehealth (INDEPENDENT_AMBULATORY_CARE_PROVIDER_SITE_OTHER): Payer: Medicare Other | Admitting: Adult Health Nurse Practitioner

## 2019-04-05 DIAGNOSIS — F422 Mixed obsessional thoughts and acts: Secondary | ICD-10-CM | POA: Diagnosis not present

## 2019-04-05 DIAGNOSIS — F429 Obsessive-compulsive disorder, unspecified: Secondary | ICD-10-CM

## 2019-04-05 HISTORY — DX: Obsessive-compulsive disorder, unspecified: F42.9

## 2019-04-05 MED ORDER — OLANZAPINE 5 MG PO TABS: 5.0000 mg | ORAL_TABLET | Freq: Every day | ORAL | 1 refills | Status: DC

## 2019-04-05 MED ORDER — OLANZAPINE 5 MG PO TABS: 5.0000 mg | ORAL_TABLET | Freq: Every day | ORAL | 3 refills | Status: DC

## 2019-04-05 NOTE — Patient Instructions (Signed)
° ° ° °  If you have lab work done today you will be contacted with your lab results within the next 2 weeks.  If you have not heard from us then please contact us. The fastest way to get your results is to register for My Chart. ° ° °IF you received an x-ray today, you will receive an invoice from [facility]. Please contact [facility] at [phone] with questions or concerns regarding your invoice.  ° °IF you received labwork today, you will receive an invoice from [facility]. Please contact [facility] at [phone] with questions or concerns regarding your invoice.  ° °Our billing staff will not be able to assist you with questions regarding bills from these companies. ° °You will be contacted with the lab results as soon as they are available. The fastest way to get your results is to activate your My Chart account. Instructions are located on the last page of this paperwork. If you have not heard from us regarding the results in 2 weeks, please contact this office. °  ° ° ° °

## 2019-04-05 NOTE — Progress Notes (Signed)
Wanting to discuss medication management.. wants to restart Olanzapine, it will help with her compulsive disorder because she says that she has pulling out her hair.

## 2019-04-05 NOTE — Progress Notes (Signed)
Telemedicine Encounter- SOAP NOTE Established Patient  This telephone encounter was conducted with the patient's (or proxy's) verbal consent via audio telecommunications: yes/no: Yes Patient was instructed to have this encounter in a suitably private space; and to only have persons present to whom they give permission to participate. In addition, patient identity was confirmed by use of name plus two identifiers (DOB and address).  I discussed the limitations, risks, security and privacy concerns of performing an evaluation and management service by telephone and the availability of in person appointments. I also discussed with the patient that there may be a patient responsible charge related to this service. The patient expressed understanding and agreed to proceed.  I spent a total of TIME; 0 MIN TO 60 MIN: 15 minutes talking with the patient or their proxy.  No chief complaint on file.   Subjective   Christine Hopkins is a 63 y.o. established patient. Telephone visit today for  Ms. Christine Hopkins is a pleasant 63 year old female with a history of Obsessive Compulsive Disorder per patient communication.  Previously, had been on Olanzapine 5mg  at night through 2018.  Had been off and had no obsessive thoughts or actions until recently.  She reports that her sister recently had a suicide attempt.  The patient had to deal with the circumstances surrounding that and her sister which has now made her overly stressed with some of her compulsive tendencies coming out again.  She has started pulling out her hair which is similar to her compulsive tendencies in the past.  Denies acute depression or anxiety.  Her sister is okay and the patient declines referral to counseling at this time.  Denies any other problems.     Patient Active Problem List   Diagnosis Date Noted  . OCD (obsessive compulsive disorder) 04/05/2019  . Type 2 diabetes mellitus with hyperglycemia, without long-term current use of  insulin (HCC) 02/18/2019  . History of back surgery 11/10/2016  . Essential hypertension 07/15/2016  . Dyslipidemia 07/15/2016  . Class 2 obesity due to excess calories with serious comorbidity and body mass index (BMI) of 36.0 to 36.9 in adult 07/15/2016  . Adjustment disorder with depressed mood 07/15/2016    Past Medical History:  Diagnosis Date  . Anxiety   . Arthritis   . Depression   . GERD (gastroesophageal reflux disease)   . History of back surgery   . Hyperlipidemia   . Hypertension   . Neuromuscular disorder (HCC)   . OCD (obsessive compulsive disorder) 04/05/2019    Current Outpatient Medications  Medication Sig Dispense Refill  . allopurinol (ZYLOPRIM) 300 MG tablet Take 1 tablet (300 mg total) by mouth daily. 90 tablet 1  . colchicine 0.6 MG tablet Take 1 tablet bid for the 5 days for gout attack 10 tablet 0  . ibuprofen (ADVIL,MOTRIN) 800 MG tablet Take 1 tablet (800 mg total) by mouth 3 (three) times daily. 90 tablet 0  . indomethacin (INDOCIN) 50 MG capsule Take 1 capsule (50 mg total) by mouth 3 (three) times daily as needed. 90 capsule 1  . lisinopril (ZESTRIL) 20 MG tablet Take 1 tablet (20 mg total) by mouth daily. 90 tablet 1  . metFORMIN (GLUCOPHAGE) 1000 MG tablet Take 1 tablet (1,000 mg total) by mouth 2 (two) times daily with a meal. 180 tablet 3  . oxyCODONE-acetaminophen (PERCOCET/ROXICET) 5-325 MG tablet Take 1 tablet by mouth every 8 (eight) hours as needed for severe pain. 15 tablet 0  . sertraline (  ZOLOFT) 100 MG tablet Take 1 tablet (100 mg total) by mouth daily. 90 tablet 3  . simvastatin (ZOCOR) 40 MG tablet Take 1 tablet (40 mg total) by mouth daily. 90 tablet 0  . OLANZapine (ZYPREXA) 5 MG tablet Take 1 tablet (5 mg total) by mouth at bedtime. 30 tablet 3  . OLANZapine (ZYPREXA) 5 MG tablet Take 1 tablet (5 mg total) by mouth at bedtime. 90 tablet 1   No current facility-administered medications for this visit.     Allergies  Allergen  Reactions  . Prozac [Fluoxetine Hcl] Hives    Social History   Socioeconomic History  . Marital status: Single    Spouse name: Not on file  . Number of children: Not on file  . Years of education: Not on file  . Highest education level: Not on file  Occupational History  . Not on file  Social Needs  . Financial resource strain: Not on file  . Food insecurity    Worry: Not on file    Inability: Not on file  . Transportation needs    Medical: Not on file    Non-medical: Not on file  Tobacco Use  . Smoking status: Never Smoker  . Smokeless tobacco: Never Used  Substance and Sexual Activity  . Alcohol use: No  . Drug use: No  . Sexual activity: Not Currently  Lifestyle  . Physical activity    Days per week: Not on file    Minutes per session: Not on file  . Stress: Not on file  Relationships  . Social Herbalist on phone: Not on file    Gets together: Not on file    Attends religious service: Not on file    Active member of club or organization: Not on file    Attends meetings of clubs or organizations: Not on file    Relationship status: Not on file  . Intimate partner violence    Fear of current or ex partner: Not on file    Emotionally abused: Not on file    Physically abused: Not on file    Forced sexual activity: Not on file  Other Topics Concern  . Not on file  Social History Narrative  . Not on file    Review of Systems  Constitutional: Negative.   Psychiatric/Behavioral: Negative for depression, hallucinations and suicidal ideas. The patient is nervous/anxious. The patient does not have insomnia.        Positive for compulsive behaviors.     Objective    GEN: , NAD, Non-toxic, Alert & Oriented x 3 PSYCH: Normally interactive. Conversant. Not. Speech normal.  Thought process linear.    Vitals as reported by the patient: There were no vitals filed for this visit.  Diagnoses and all orders for this visit:  Mixed obsessional thoughts and  acts  Other orders -     OLANZapine (ZYPREXA) 5 MG tablet; Take 1 tablet (5 mg total) by mouth at bedtime. -     OLANZapine (ZYPREXA) 5 MG tablet; Take 1 tablet (5 mg total) by mouth at bedtime.     I discussed the assessment and treatment plan with the patient. The patient was provided an opportunity to ask questions and all were answered. The patient agreed with the plan and demonstrated an understanding of the instructions.   The patient was advised to call back or seek an in-person evaluation if the symptoms worsen or if the condition fails to improve as anticipated.  I provided 15 minutes of non-face-to-face time during this encounter.  Elyse Jarvis, NP  Primary Care at [facility]

## 2019-04-25 ENCOUNTER — Telehealth: Payer: Self-pay | Admitting: Family Medicine

## 2019-04-25 MED ORDER — SIMVASTATIN 40 MG PO TABS: 40.0000 mg | ORAL_TABLET | Freq: Every day | ORAL | 0 refills | Status: DC

## 2019-04-25 NOTE — Telephone Encounter (Signed)
Refills for sertraline, allopurinol and lisinopril are available at requested pharmacy.

## 2019-04-25 NOTE — Telephone Encounter (Signed)
Medication: sertraline (ZOLOFT) 100 MG tablet [[id number]] , simvastatin (ZOCOR) 40 MG tablet [[id number]] , allopurinol (ZYLOPRIM) 300 MG tablet [[id number]] , lisinopril (ZESTRIL) 20 MG tablet [[id number]]   Has the patient contacted their pharmacy? Yes  (Agent: If no, request that the patient contact the pharmacy for the refill.) (Agent: If yes, when and what did the pharmacy advise?)  Preferred Pharmacy (with phone number or street name): [place], [place] [phone] (Phone) [phone] (Fax)    Agent: Please be advised that RX refills may take up to 3 business days. We ask that you follow-up with your pharmacy.

## 2019-05-01 ENCOUNTER — Telehealth: Payer: Self-pay | Admitting: Family Medicine

## 2019-05-01 NOTE — Telephone Encounter (Signed)
Pt is still waiting on medication from the mail order pharmacy and is now out of medication. Pt wants to know if she can get a script for all of these sent to local pharmacy. Pt uses  [place] [place] [employer] [phone] (Phone) [phone] (Fax)

## 2019-05-01 NOTE — Telephone Encounter (Signed)
Pt requesting med refill   (out of medication and feeling very tired) Says she spoke to someone about it on 05/01/19 , says that her meds were refilled but have not been delivered and will take 5 days to get to her home  lisinopril (ZESTRIL) 20 MG tablet allopurinol (ZYLOPRIM) 300 MG tablet  5 day supply  Pharmacy [facility]

## 2019-05-03 NOTE — Telephone Encounter (Signed)
Tried to call pt about medication no answer, will try again later.

## 2019-05-13 ENCOUNTER — Other Ambulatory Visit: Payer: Self-pay

## 2019-05-13 ENCOUNTER — Telehealth (INDEPENDENT_AMBULATORY_CARE_PROVIDER_SITE_OTHER): Payer: Medicare Other | Admitting: Registered Nurse

## 2019-05-13 DIAGNOSIS — M791 Myalgia, unspecified site: Secondary | ICD-10-CM

## 2019-05-13 DIAGNOSIS — R112 Nausea with vomiting, unspecified: Secondary | ICD-10-CM | POA: Diagnosis not present

## 2019-05-13 MED ORDER — MELOXICAM 7.5 MG PO TABS: 7.5000 mg | ORAL_TABLET | Freq: Every day | ORAL | 0 refills | Status: DC

## 2019-05-13 MED ORDER — ONDANSETRON HCL 8 MG PO TABS: 8.0000 mg | ORAL_TABLET | Freq: Three times a day (TID) | ORAL | 0 refills | Status: DC | PRN

## 2019-05-13 NOTE — Progress Notes (Signed)
Telemedicine Encounter- SOAP NOTE Established Patient  This telephone encounter was conducted with the patient's (or proxy's) verbal consent via audio telecommunications: yes  Patient was instructed to have this encounter in a suitably private space; and to only have persons present to whom they give permission to participate. In addition, patient identity was confirmed by use of name plus two identifiers (DOB and address).  I discussed the limitations, risks, security and privacy concerns of performing an evaluation and management service by telephone and the availability of in person appointments. I also discussed with the patient that there may be a patient responsible charge related to this service. The patient expressed understanding and agreed to proceed.  I spent a total of 13 minutes talking with the patient or their proxy.  No chief complaint on file.   Subjective   Christine Hopkins is a 63 y.o. established patient. Telephone visit today for COVID symptoms  HPI She notes that she has been having symptoms since Friday. She has been having nausea and vomiting, myalgias, arthralgias, and chills. She denies fevers and shob at this time. She also denies cough, new or different headaches, chest pain, congestion, diarrhea.  Patient Active Problem List   Diagnosis Date Noted  . OCD (obsessive compulsive disorder) 04/05/2019  . Type 2 diabetes mellitus with hyperglycemia, without long-term current use of insulin ([place]) 02/18/2019  . History of back surgery 11/10/2016  . Essential hypertension 07/15/2016  . Dyslipidemia 07/15/2016  . Class 2 obesity due to excess calories with serious comorbidity and body mass index (BMI) of 36.0 to 36.9 in adult 07/15/2016  . Adjustment disorder with depressed mood 07/15/2016    Past Medical History:  Diagnosis Date  . Anxiety   . Arthritis   . Depression   . GERD (gastroesophageal reflux disease)   . History of back surgery   . Hyperlipidemia    . Hypertension   . Neuromuscular disorder ([place])   . OCD (obsessive compulsive disorder) 04/05/2019    Current Outpatient Medications  Medication Sig Dispense Refill  . allopurinol (ZYLOPRIM) 300 MG tablet Take 1 tablet (300 mg total) by mouth daily. 90 tablet 1  . colchicine 0.6 MG tablet Take 1 tablet bid for the 5 days for gout attack 10 tablet 0  . ibuprofen (ADVIL,MOTRIN) 800 MG tablet Take 1 tablet (800 mg total) by mouth 3 (three) times daily. 90 tablet 0  . indomethacin (INDOCIN) 50 MG capsule Take 1 capsule (50 mg total) by mouth 3 (three) times daily as needed. 90 capsule 1  . lisinopril (ZESTRIL) 20 MG tablet Take 1 tablet (20 mg total) by mouth daily. 90 tablet 1  . metFORMIN (GLUCOPHAGE) 1000 MG tablet Take 1 tablet (1,000 mg total) by mouth 2 (two) times daily with a meal. 180 tablet 3  . OLANZapine (ZYPREXA) 5 MG tablet Take 1 tablet (5 mg total) by mouth at bedtime. 90 tablet 1  . oxyCODONE-acetaminophen (PERCOCET/ROXICET) 5-325 MG tablet Take 1 tablet by mouth every 8 (eight) hours as needed for severe pain. 15 tablet 0  . sertraline (ZOLOFT) 100 MG tablet Take 1 tablet (100 mg total) by mouth daily. 90 tablet 3  . simvastatin (ZOCOR) 40 MG tablet Take 1 tablet (40 mg total) by mouth daily. 90 tablet 0  . meloxicam (MOBIC) 7.5 MG tablet Take 1 tablet (7.5 mg total) by mouth daily. 30 tablet 0  . OLANZapine (ZYPREXA) 5 MG tablet Take 1 tablet (5 mg total) by mouth at bedtime. 30 tablet  3  . ondansetron (ZOFRAN) 8 MG tablet Take 1 tablet (8 mg total) by mouth every 8 (eight) hours as needed for nausea or vomiting. 20 tablet 0   No current facility-administered medications for this visit.     Allergies  Allergen Reactions  . Prozac [Fluoxetine Hcl] Hives    Social History   Socioeconomic History  . Marital status: Single    Spouse name: Not on file  . Number of children: Not on file  . Years of education: Not on file  . Highest education level: Not on file   Occupational History  . Not on file  Social Needs  . Financial resource strain: Not on file  . Food insecurity    Worry: Not on file    Inability: Not on file  . Transportation needs    Medical: Not on file    Non-medical: Not on file  Tobacco Use  . Smoking status: Never Smoker  . Smokeless tobacco: Never Used  Substance and Sexual Activity  . Alcohol use: No  . Drug use: No  . Sexual activity: Not Currently  Lifestyle  . Physical activity    Days per week: Not on file    Minutes per session: Not on file  . Stress: Not on file  Relationships  . Social Musician on phone: Not on file    Gets together: Not on file    Attends religious service: Not on file    Active member of club or organization: Not on file    Attends meetings of clubs or organizations: Not on file    Relationship status: Not on file  . Intimate partner violence    Fear of current or ex partner: Not on file    Emotionally abused: Not on file    Physically abused: Not on file    Forced sexual activity: Not on file  Other Topics Concern  . Not on file  Social History Narrative  . Not on file    ROS Per hpi  Objective   Vitals as reported by the patient: There were no vitals filed for this visit.  Diagnoses and all orders for this visit:  Non-intractable vomiting with nausea, unspecified vomiting type -     ondansetron (ZOFRAN) 8 MG tablet; Take 1 tablet (8 mg total) by mouth every 8 (eight) hours as needed for nausea or vomiting.  Myalgia -     meloxicam (MOBIC) 7.5 MG tablet; Take 1 tablet (7.5 mg total) by mouth daily.   PLAN  Meloxicam for aches  Ondansetron for nausea  Suggest COVID testing  Patient encouraged to call clinic with any questions, comments, or concerns.   I discussed the assessment and treatment plan with the patient. The patient was provided an opportunity to ask questions and all were answered. The patient agreed with the plan and demonstrated an  understanding of the instructions.   The patient was advised to call back or seek an in-person evaluation if the symptoms worsen or if the condition fails to improve as anticipated.  I provided 13 minutes of non-face-to-face time during this encounter.  Janeece Agee, NP  Primary [facility] at [facility]

## 2019-05-13 NOTE — Progress Notes (Signed)
Spoke with pt and she states she has been having some COVID-symptoms since Friday. She states she has been having some N/V,  Body pain/joint pain with chills.  She states she has not had any SHOB or fever at this time. Would need something to help with the N/V and possible testing.

## 2019-05-14 ENCOUNTER — Other Ambulatory Visit: Payer: Self-pay

## 2019-05-14 DIAGNOSIS — Z20822 Contact with and (suspected) exposure to covid-19: Secondary | ICD-10-CM

## 2019-05-16 LAB — NOVEL CORONAVIRUS, NAA: SARS-CoV-2, NAA: NOT DETECTED

## 2019-06-13 ENCOUNTER — Other Ambulatory Visit: Payer: Self-pay | Admitting: Family Medicine

## 2019-06-13 NOTE — Telephone Encounter (Signed)
Forwarding medication refill request to the clinical pool for review. 

## 2019-06-18 ENCOUNTER — Telehealth: Payer: Self-pay | Admitting: Family Medicine

## 2019-06-18 ENCOUNTER — Telehealth: Payer: Medicare Other | Admitting: Family Medicine

## 2019-06-18 ENCOUNTER — Other Ambulatory Visit: Payer: Self-pay

## 2019-06-18 ENCOUNTER — Encounter: Payer: Self-pay | Admitting: Family Medicine

## 2019-06-18 NOTE — Telephone Encounter (Signed)
Pt refused appointment regarding her med refills, said that she had car trouble but that she just wanted her med refills. She said that she did not need the appointment and if the provider was not able to get her med refills then she would change providers

## 2019-07-03 ENCOUNTER — Encounter: Payer: Self-pay | Admitting: Family Medicine

## 2019-07-03 ENCOUNTER — Other Ambulatory Visit: Payer: Self-pay

## 2019-07-03 ENCOUNTER — Ambulatory Visit (INDEPENDENT_AMBULATORY_CARE_PROVIDER_SITE_OTHER): Payer: Medicare Other | Admitting: Family Medicine

## 2019-07-03 VITALS — BP 147/96 | HR 70 | Temp 98.1°F | Resp 17 | Ht 64.0 in | Wt 207.8 lb

## 2019-07-03 DIAGNOSIS — Z23 Encounter for immunization: Secondary | ICD-10-CM

## 2019-07-03 DIAGNOSIS — E785 Hyperlipidemia, unspecified: Secondary | ICD-10-CM | POA: Diagnosis not present

## 2019-07-03 DIAGNOSIS — Z135 Encounter for screening for eye and ear disorders: Secondary | ICD-10-CM

## 2019-07-03 DIAGNOSIS — M1A4721 Other secondary chronic gout, left ankle and foot, with tophus (tophi): Secondary | ICD-10-CM

## 2019-07-03 DIAGNOSIS — E1165 Type 2 diabetes mellitus with hyperglycemia: Secondary | ICD-10-CM

## 2019-07-03 DIAGNOSIS — I10 Essential (primary) hypertension: Secondary | ICD-10-CM

## 2019-07-03 DIAGNOSIS — M109 Gout, unspecified: Secondary | ICD-10-CM

## 2019-07-03 MED ORDER — LISINOPRIL 20 MG PO TABS: 20.0000 mg | ORAL_TABLET | Freq: Every day | ORAL | 1 refills | Status: DC

## 2019-07-03 MED ORDER — ALLOPURINOL 300 MG PO TABS: 300.0000 mg | ORAL_TABLET | Freq: Every day | ORAL | 1 refills | Status: DC

## 2019-07-03 MED ORDER — METFORMIN HCL 1000 MG PO TABS: 1000.0000 mg | ORAL_TABLET | Freq: Two times a day (BID) | ORAL | 3 refills | Status: DC

## 2019-07-03 NOTE — Progress Notes (Signed)
Established Patient Office Visit  Subjective:  Patient ID: Christine Hopkins, female    DOB: 07-18-1955  Age: 64 y.o. MRN: [id number]  CC:  Chief Complaint  Patient presents with  . gout and allopurinol    6 month f/u    HPI Christine Hopkins presents for   Type 2 Diabetes Lab Results  Component Value Date   HGBA1C 6.0 (H) 07/03/2019   Diabetes Mellitus: Patient presents for follow up of diabetes. Symptoms: hyperglycemia. Symptoms have stabilized. Patient denies hypoglycemia , nausea, polydipsia, polyuria and visual disturbances.  Evaluation to date has been included: hemoglobin A1C.  Home sugars: patient does not check sugars. She stopped the metformin because of vomiting.  She takes the metformin if she feels like her sugars are high then she takes it.  She aches when her sugars are high.  She is also on lisinopril She is takes simavastatin No aspirin She sees Ophthalmology and was told there was no retinopathy.  Gout She takes allopurinol daily She states that she thinks that her gout was triggered by sweet drinks Now she only eats chicken, beef and tuna but not shellfish.   She has not had a gout attack in a year and it usually goes to the left big toe.  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  Compliance with treatment thus far has been good.  A repeat fasting lipid profile was done.  The patient does use medications that may worsen dyslipidemias (corticosteroids, progestins, anabolic steroids, diuretics, beta-blockers, amiodarone, cyclosporine, olanzapine). The patient exercises  intermittently.  The patient is not known to have coexisting coronary artery disease.    The 10-year ASCVD risk score Mikey Bussing DC Brooke Bonito., et al., 2013) is: 25.5%   Values used to calculate the score:     Age: 60 years     Sex: Female     Is Non-Hispanic African American: Yes     Diabetic: Yes     Tobacco smoker: No     Systolic Blood Pressure: 361 mmHg     Is BP treated: Yes     HDL Cholesterol: 60 mg/dL     Total Cholesterol: 207 mg/dL  Morbid Obesity Pt has changed her diet and she is eating better and exercising when she can  She focuses on her proteins and less fatty foods She states that she is trying to change her eating to control her sugars Wt Readings from Last 3 Encounters:  07/03/19 207 lb 12.8 oz (94.3 kg)  02/18/19 221 lb 6.4 oz (100.4 kg)  01/07/19 225 lb 9.6 oz (102.3 kg)  Body mass index is 35.67 kg/m.     Past Medical History:  Diagnosis Date  . Anxiety   . Arthritis   . Depression   . GERD (gastroesophageal reflux disease)   . History of back surgery   . Hyperlipidemia   . Hypertension   . Neuromuscular disorder ([place])   . OCD (obsessive compulsive disorder) 04/05/2019    Past Surgical History:  Procedure Laterality Date  . FRACTURE SURGERY    .  JOINT REPLACEMENT    . SPINE SURGERY      Family History  Problem Relation Age of Onset  . Depression Father   . Mental illness Sister   . Depression Sister   . Schizophrenia Sister   . Diabetes Brother   . Hyperlipidemia Brother   . Mental illness Brother   . Depression Brother   . Schizophrenia Brother   . Heart disease Brother   . Diabetes Brother   . Diabetes Maternal Grandmother   . Heart disease Maternal Grandfather   . Heart disease Paternal Grandmother     Social History   Socioeconomic History  . Marital status: Single    Spouse name: Not on file  . Number of children: Not on file  . Years of education: Not on file  . Highest education level: Not on file  Occupational History  .  Not on file  Tobacco Use  . Smoking status: Never Smoker  . Smokeless tobacco: Never Used  Substance and Sexual Activity  . Alcohol use: No  . Drug use: No  . Sexual activity: Not Currently  Other Topics Concern  . Not on file  Social History Narrative  . Not on file   Social Determinants of Health   Financial Resource Strain:   . Difficulty of Paying Living Expenses: Not on file  Food Insecurity:   . Worried About Charity fundraiser in the Last Year: Not on file  . Ran Out of Food in the Last Year: Not on file  Transportation Needs:   . Lack of Transportation (Medical): Not on file  . Lack of Transportation (Non-Medical): Not on file  Physical Activity:   . Days of Exercise per Week: Not on file  . Minutes of Exercise per Session: Not on file  Stress:   . Feeling of Stress : Not on file  Social Connections:   . Frequency of Communication with Friends and Family: Not on file  . Frequency of Social Gatherings with Friends and Family: Not on file  . Attends Religious Services: Not on file  . Active Member of Clubs or Organizations: Not on file  . Attends Archivist Meetings: Not on file  . Marital Status: Not on file  Intimate Partner Violence:   . Fear of Current or Ex-Partner: Not on file  . Emotionally Abused: Not on file  . Physically Abused: Not on file  . Sexually Abused: Not on file    Outpatient Medications Prior to Visit  Medication Sig Dispense Refill  . colchicine 0.6 MG tablet Take 1 tablet bid for the 5 days for gout attack 10 tablet 0  . ibuprofen (ADVIL,MOTRIN) 800 MG tablet Take 1 tablet (800 mg total) by mouth 3 (three) times daily. 90 tablet 0  . indomethacin (INDOCIN) 50 MG capsule Take 1 capsule (50 mg total) by mouth 3 (three) times daily as needed. 90 capsule 1  . meloxicam (MOBIC) 7.5 MG tablet Take 1 tablet (7.5 mg total) by mouth daily. 30 tablet 0  . OLANZapine (ZYPREXA) 5 MG tablet Take 1 tablet (5 mg total) by mouth at bedtime. 90  tablet 1  . ondansetron (ZOFRAN) 8 MG tablet Take 1 tablet (8 mg total) by mouth every 8 (eight) hours as needed for nausea or vomiting. 20 tablet 0  . oxyCODONE-acetaminophen (PERCOCET/ROXICET) 5-325 MG tablet Take 1 tablet by mouth every 8 (eight) hours as needed for severe pain. 15 tablet 0  . sertraline (ZOLOFT) 100 MG tablet Take  1 tablet (100 mg total) by mouth daily. 90 tablet 3  . simvastatin (ZOCOR) 40 MG tablet TAKE 1 TABLET BY MOUTH  DAILY 90 tablet 0  . allopurinol (ZYLOPRIM) 300 MG tablet Take 1 tablet (300 mg total) by mouth daily. 90 tablet 1  . lisinopril (ZESTRIL) 20 MG tablet Take 1 tablet (20 mg total) by mouth daily. 90 tablet 1  . metFORMIN (GLUCOPHAGE) 1000 MG tablet Take 1 tablet (1,000 mg total) by mouth 2 (two) times daily with a meal. 180 tablet 3  . OLANZapine (ZYPREXA) 5 MG tablet Take 1 tablet (5 mg total) by mouth at bedtime. 30 tablet 3   No facility-administered medications prior to visit.    Allergies  Allergen Reactions  . Prozac [Fluoxetine Hcl] Hives    ROS Review of Systems See hpi Review of Systems  Constitutional: Negative for activity change, appetite change, chills and fever.  HENT: Negative for congestion, nosebleeds, trouble swallowing and voice change.   Respiratory: Negative for cough, shortness of breath and wheezing.   Gastrointestinal: Negative for diarrhea, nausea and vomiting.  Genitourinary: Negative for difficulty urinating, dysuria, flank pain and hematuria.  Musculoskeletal: Negative for back pain, joint swelling and neck pain.  Neurological: Negative for dizziness, speech difficulty, light-headedness and numbness.  See HPI. All other review of systems negative.     Objective:    Physical Exam  BP (!) 147/96 (BP Location: Right Arm, Patient Position: Sitting, Cuff Size: Large)   Pulse 70   Temp 98.1 F (36.7 C) (Oral)   Resp 17   Ht 5' 4" (1.626 m)   Wt 207 lb 12.8 oz (94.3 kg)   SpO2 97%   BMI 35.67 kg/m  Wt Readings  from Last 3 Encounters:  07/03/19 207 lb 12.8 oz (94.3 kg)  02/18/19 221 lb 6.4 oz (100.4 kg)  01/07/19 225 lb 9.6 oz (102.3 kg)   Physical Exam  Constitutional: Oriented to person, place, and time. Appears well-developed and well-nourished.  HENT:  Head: Normocephalic and atraumatic.  Eyes: Conjunctivae and EOM are normal.  Cardiovascular: Normal rate, regular rhythm, normal heart sounds and intact distal pulses.  No murmur heard. Pulmonary/Chest: Effort normal and breath sounds normal. No stridor. No respiratory distress. Has no wheezes.  Abdomen: non-distended, normoactive bs, soft, non-tender Neurological: Is alert and oriented to person, place, and time.  Skin: Skin is warm. Capillary refill takes less than 2 seconds.  Psychiatric: Has a normal mood and affect. Behavior is normal. Judgment and thought content normal.    Health Maintenance Due  Topic Date Due  . OPHTHALMOLOGY EXAM  07/30/1965    There are no preventive care reminders to display for this patient.  Lab Results  Component Value Date   TSH 1.820 07/03/2019   Lab Results  Component Value Date   WBC 8.8 07/03/2019   HGB 12.5 07/03/2019   HCT 39.0 07/03/2019   MCV 84 07/03/2019   PLT 358 07/03/2019   Lab Results  Component Value Date   NA 146 (H) 07/03/2019   K 3.9 07/03/2019   CO2 27 07/03/2019   GLUCOSE 101 (H) 07/03/2019   BUN 11 07/03/2019   CREATININE 0.76 07/03/2019   BILITOT <0.2 07/03/2019   ALKPHOS 74 07/03/2019   AST 13 07/03/2019   ALT 9 07/03/2019   PROT 6.4 07/03/2019   ALBUMIN 4.2 07/03/2019   CALCIUM 9.3 07/03/2019   ANIONGAP 8 03/20/2018   Lab Results  Component Value Date   CHOL 207 (H) 07/03/2019  Lab Results  Component Value Date   HDL 60 07/03/2019   Lab Results  Component Value Date   LDLCALC 134 (H) 07/03/2019   Lab Results  Component Value Date   TRIG 74 07/03/2019   Lab Results  Component Value Date   CHOLHDL 3.5 07/03/2019   Lab Results  Component  Value Date   HGBA1C 6.0 (H) 07/03/2019      Assessment & Plan:   Problem List Items Addressed This Visit      Cardiovascular and Mediastinum   Essential hypertension  - discussed goal to get bp less than 147/96   Relevant Medications   lisinopril (ZESTRIL) 20 MG tablet     Endocrine   Type 2 diabetes mellitus with hyperglycemia, without long-term current use of insulin ([facility]) Lab Results  Component Value Date   HGBA1C 6.0 (H) 07/03/2019   Diabetes at goal, quality  Metrics reviewed, continue ace inhibitor   Relevant Medications   lisinopril (ZESTRIL) 20 MG tablet   metFORMIN (GLUCOPHAGE) 1000 MG tablet   Other Relevant Orders   HM Diabetes Foot Exam (Completed)   Hemoglobin A1c (Completed)   CBC (Completed)   TSH (Completed)   CMP14+EGFR (Completed)   Lipid panel (Completed)     Other   Dyslipidemia   Relevant Orders   TSH (Completed)   CMP14+EGFR (Completed)   Lipid panel (Completed)    Other Visit Diagnoses    Gout involving toe, unspecified cause, unspecified chronicity, unspecified laterality    -  Primary Reviewed purine diet and advised pt to avoid triggers   Relevant Medications   allopurinol (ZYLOPRIM) 300 MG tablet   Need for prophylactic vaccination against Streptococcus pneumoniae (pneumococcus)       Relevant Orders   Pneumococcal polysaccharide vaccine 23-valent greater than or equal to 2yo subcutaneous/IM (Completed)   Screening for diabetic retinopathy    -  Continue eye exam routinely   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 DISCONTD: lisinopril (ZESTRIL) 20 MG tablet    Sig: Take 1 tablet (20 mg total) by mouth daily.    Dispense:  90 tablet    Refill:  1  . DISCONTD: metFORMIN (GLUCOPHAGE) 1000 MG tablet     Sig: Take 1 tablet (1,000 mg total) by mouth 2 (two) times daily with a meal.    Dispense:  180 tablet    Refill:  3  . DISCONTD: allopurinol (ZYLOPRIM) 300 MG tablet    Sig: Take 1 tablet (300 mg total) by mouth daily.    Dispense:  90 tablet    Refill:  1  . allopurinol (ZYLOPRIM) 300 MG tablet    Sig: Take 1 tablet (300 mg total) by mouth daily.    Dispense:  90 tablet    Refill:  1  . lisinopril (ZESTRIL) 20 MG tablet    Sig: Take 1 tablet (20 mg total) by mouth daily.    Dispense:  90 tablet    Refill:  1  . metFORMIN (GLUCOPHAGE) 1000 MG tablet    Sig: Take 1 tablet (1,000 mg total) by mouth 2 (two) times daily with a meal.    Dispense:  180 tablet    Refill:  3  Follow-up: No follow-ups on file.    Forrest Moron, MD

## 2019-07-03 NOTE — Patient Instructions (Signed)
° ° ° °  If you have lab work done today you will be contacted with your lab results within the next 2 weeks.  If you have not heard from us then please contact us. The fastest way to get your results is to register for My Chart. ° ° °IF you received an x-ray today, you will receive an invoice from [facility]. Please contact [facility] at [phone] with questions or concerns regarding your invoice.  ° °IF you received labwork today, you will receive an invoice from [facility]. Please contact [facility] at [phone] with questions or concerns regarding your invoice.  ° °Our billing staff will not be able to assist you with questions regarding bills from these companies. ° °You will be contacted with the lab results as soon as they are available. The fastest way to get your results is to activate your My Chart account. Instructions are located on the last page of this paperwork. If you have not heard from us regarding the results in 2 weeks, please contact this office. °  ° ° ° °

## 2019-07-04 LAB — CMP14+EGFR
ALT: 9 IU/L (ref 0–32)
AST: 13 IU/L (ref 0–40)
Albumin/Globulin Ratio: 1.9 (ref 1.2–2.2)
Albumin: 4.2 g/dL (ref 3.8–4.8)
Alkaline Phosphatase: 74 IU/L (ref 39–117)
BUN/Creatinine Ratio: 14 (ref 12–28)
BUN: 11 mg/dL (ref 8–27)
Bilirubin Total: <0.2 mg/dL (ref 0.0–1.2)
CO2: 27 mmol/L (ref 20–29)
Calcium: 9.3 mg/dL (ref 8.7–10.3)
Chloride: 108 mmol/L — ABNORMAL HIGH (ref 96–106)
Creatinine, Ser: 0.76 mg/dL (ref 0.57–1.00)
GFR calc Af Amer: 97 mL/min/{1.73_m2} (ref >59)
GFR calc non Af Amer: 84 mL/min/{1.73_m2} (ref >59)
Globulin, Total: 2.2 g/dL (ref 1.5–4.5)
Glucose: 101 mg/dL — ABNORMAL HIGH (ref 65–99)
Potassium: 3.9 mmol/L (ref 3.5–5.2)
Sodium: 146 mmol/L — ABNORMAL HIGH (ref 134–144)
Total Protein: 6.4 g/dL (ref 6.0–8.5)

## 2019-07-04 LAB — CBC
Hematocrit: 39 % (ref 34.0–46.6)
Hemoglobin: 12.5 g/dL (ref 11.1–15.9)
MCH: 27.1 pg (ref 26.6–33.0)
MCHC: 32.1 g/dL (ref 31.5–35.7)
MCV: 84 fL (ref 79–97)
Platelets: 358 10*3/uL (ref 150–450)
RBC: 4.62 x10E6/uL (ref 3.77–5.28)
RDW: 15.8 % — ABNORMAL HIGH (ref 11.7–15.4)
WBC: 8.8 10*3/uL (ref 3.4–10.8)

## 2019-07-04 LAB — TSH: TSH: 1.82 u[IU]/mL (ref 0.450–4.500)

## 2019-07-04 LAB — HEMOGLOBIN A1C
Est. average glucose Bld gHb Est-mCnc: 126 mg/dL
Hgb A1c MFr Bld: 6 % — ABNORMAL HIGH (ref 4.8–5.6)

## 2019-07-04 LAB — LIPID PANEL
Chol/HDL Ratio: 3.5 ratio (ref 0.0–4.4)
Cholesterol, Total: 207 mg/dL — ABNORMAL HIGH (ref 100–199)
HDL: 60 mg/dL (ref >39)
LDL Chol Calc (NIH): 134 mg/dL — ABNORMAL HIGH (ref 0–99)
Triglycerides: 74 mg/dL (ref 0–149)
VLDL Cholesterol Cal: 13 mg/dL (ref 5–40)

## 2019-07-26 ENCOUNTER — Emergency Department (HOSPITAL_COMMUNITY)
Admission: EM | Admit: 2019-07-26 | Discharge: 2019-07-26 | Disposition: A | Discharge status: Home / Self Care | Payer: Medicare Other | Attending: Emergency Medicine | Admitting: Emergency Medicine

## 2019-07-26 ENCOUNTER — Emergency Department (HOSPITAL_COMMUNITY): Payer: Medicare Other

## 2019-07-26 ENCOUNTER — Other Ambulatory Visit: Payer: Self-pay

## 2019-07-26 ENCOUNTER — Encounter (HOSPITAL_COMMUNITY): Payer: Self-pay

## 2019-07-26 DIAGNOSIS — X58XXXA Exposure to other specified factors, initial encounter: Secondary | ICD-10-CM | POA: Insufficient documentation

## 2019-07-26 DIAGNOSIS — M79662 Pain in left lower leg: Secondary | ICD-10-CM | POA: Diagnosis not present

## 2019-07-26 DIAGNOSIS — Y929 Unspecified place or not applicable: Secondary | ICD-10-CM | POA: Insufficient documentation

## 2019-07-26 DIAGNOSIS — Z7984 Long term (current) use of oral hypoglycemic drugs: Secondary | ICD-10-CM | POA: Insufficient documentation

## 2019-07-26 DIAGNOSIS — Z79899 Other long term (current) drug therapy: Secondary | ICD-10-CM | POA: Diagnosis not present

## 2019-07-26 DIAGNOSIS — I10 Essential (primary) hypertension: Secondary | ICD-10-CM | POA: Diagnosis not present

## 2019-07-26 DIAGNOSIS — S86112A Strain of other muscle(s) and tendon(s) of posterior muscle group at lower leg level, left leg, initial encounter: Secondary | ICD-10-CM | POA: Diagnosis not present

## 2019-07-26 DIAGNOSIS — Y9389 Activity, other specified: Secondary | ICD-10-CM | POA: Insufficient documentation

## 2019-07-26 DIAGNOSIS — M79605 Pain in left leg: Secondary | ICD-10-CM | POA: Insufficient documentation

## 2019-07-26 DIAGNOSIS — Y999 Unspecified external cause status: Secondary | ICD-10-CM | POA: Diagnosis not present

## 2019-07-26 DIAGNOSIS — S8992XA Unspecified injury of left lower leg, initial encounter: Secondary | ICD-10-CM | POA: Diagnosis present

## 2019-07-26 DIAGNOSIS — E119 Type 2 diabetes mellitus without complications: Secondary | ICD-10-CM | POA: Insufficient documentation

## 2019-07-26 DIAGNOSIS — R252 Cramp and spasm: Secondary | ICD-10-CM | POA: Diagnosis not present

## 2019-07-26 HISTORY — DX: Type 2 diabetes mellitus without complications: E11.9

## 2019-07-26 LAB — BASIC METABOLIC PANEL
Anion gap: 7 (ref 5–15)
BUN: 12 mg/dL (ref 8–23)
CO2: 30 mmol/L (ref 22–32)
Calcium: 9 mg/dL (ref 8.9–10.3)
Chloride: 107 mmol/L (ref 98–111)
Creatinine, Ser: 0.88 mg/dL (ref 0.44–1.00)
GFR calc Af Amer: >60 mL/min (ref >60)
GFR calc non Af Amer: >60 mL/min (ref >60)
Glucose, Bld: 108 mg/dL — ABNORMAL HIGH (ref 70–99)
Potassium: 3.9 mmol/L (ref 3.5–5.1)
Sodium: 144 mmol/L (ref 135–145)

## 2019-07-26 NOTE — Discharge Instructions (Addendum)
Your lab work and your DVT ultrasound study are normal today.  There is no evidence for a DVT as a source of your symptoms.  I suspect your calf muscle is strain and tender from the spasm that you had.  Applying a heating pad to your leg 20 minutes several times daily in association with anti-inflammatory such as ibuprofen should help this heal quicker.  Plan to see your doctor for recheck of your symptoms are not improving over the next week.

## 2019-07-26 NOTE — ED Triage Notes (Signed)
Pt reports Charlie horse in left calf 3 nights ago and conts to hurt. Concerned about DVT. Hurts worse with ambulating

## 2019-07-26 NOTE — ED Provider Notes (Signed)
[facility] EMERGENCY DEPARTMENT Provider Note   CSN: [id number] Arrival date & time: 07/26/19  8768     History Chief Complaint  Patient presents with  . Leg Pain    Christine Hopkins is a 64 y.o. female with a history of DM, GERD, hyperlipidemia and HTN presenting with a 3-day history of persistent left calf pain after awaking 3 nights ago with a severe cramp in that muscle.  She reports occasional muscle cramping, nothing severe which generally does not cause prolonged pain and is concerned for possible DVT.  She denies history of DVT, has had no recent long periods of sedation, no exogenous estrogen use.  No personal or family history of clotting disorders.  She is pain-free at rest, walking causes localized pain to the left medial posterior calf.  She denies chest pain or shortness of breath.  She has had no treatments for this condition prior to presentation.  HPI     Past Medical History:  Diagnosis Date  . Anxiety   . Arthritis   . Depression   . Diabetes mellitus without complication (HCC)   . GERD (gastroesophageal reflux disease)   . History of back surgery   . Hyperlipidemia   . Hypertension   . Neuromuscular disorder (HCC)   . OCD (obsessive compulsive disorder) 04/05/2019    Patient Active Problem List   Diagnosis Date Noted  . OCD (obsessive compulsive disorder) 04/05/2019  . Type 2 diabetes mellitus with hyperglycemia, without long-term current use of insulin (HCC) 02/18/2019  . History of back surgery 11/10/2016  . Essential hypertension 07/15/2016  . Dyslipidemia 07/15/2016  . Class 2 obesity due to excess calories with serious comorbidity and body mass index (BMI) of 36.0 to 36.9 in adult 07/15/2016  . Adjustment disorder with depressed mood 07/15/2016    Past Surgical History:  Procedure Laterality Date  . FRACTURE SURGERY    . JOINT REPLACEMENT    . SPINE SURGERY       OB History   No obstetric history on file.     Family History  Problem  Relation Age of Onset  . Depression Father   . Mental illness Sister   . Depression Sister   . Schizophrenia Sister   . Diabetes Brother   . Hyperlipidemia Brother   . Mental illness Brother   . Depression Brother   . Schizophrenia Brother   . Heart disease Brother   . Diabetes Brother   . Diabetes Maternal Grandmother   . Heart disease Maternal Grandfather   . Heart disease Paternal Grandmother     Social History   Tobacco Use  . Smoking status: Never Smoker  . Smokeless tobacco: Never Used  Substance Use Topics  . Alcohol use: No  . Drug use: No    Home Medications Prior to Admission medications   Medication Sig Start Date End Date Taking? Authorizing Provider  allopurinol (ZYLOPRIM) 300 MG tablet Take 1 tablet (300 mg total) by mouth daily. 07/03/19   Doristine Bosworth, MD  colchicine 0.6 MG tablet Take 1 tablet bid for the 5 days for gout attack 01/07/19   Doristine Bosworth, MD  ibuprofen (ADVIL,MOTRIN) 800 MG tablet Take 1 tablet (800 mg total) by mouth 3 (three) times daily. 09/13/17   Doristine Bosworth, MD  indomethacin (INDOCIN) 50 MG capsule Take 1 capsule (50 mg total) by mouth 3 (three) times daily as needed. 01/07/19   Doristine Bosworth, MD  lisinopril (ZESTRIL) 20 MG tablet Take 1  tablet (20 mg total) by mouth daily. 07/03/19   Forrest Moron, MD  meloxicam (MOBIC) 7.5 MG tablet Take 1 tablet (7.5 mg total) by mouth daily. 05/13/19   Maximiano Coss, NP  metFORMIN (GLUCOPHAGE) 1000 MG tablet Take 1 tablet (1,000 mg total) by mouth 2 (two) times daily with a meal. 07/03/19   Stallings, Zoe A, MD  OLANZapine (ZYPREXA) 5 MG tablet Take 1 tablet (5 mg total) by mouth at bedtime. 04/05/19 05/05/19  Wendall Mola, NP  OLANZapine (ZYPREXA) 5 MG tablet Take 1 tablet (5 mg total) by mouth at bedtime. 04/05/19 07/04/19  Wendall Mola, NP  ondansetron (ZOFRAN) 8 MG tablet Take 1 tablet (8 mg total) by mouth every 8 (eight) hours as needed for nausea or vomiting. 05/13/19    Maximiano Coss, NP  oxyCODONE-acetaminophen (PERCOCET/ROXICET) 5-325 MG tablet Take 1 tablet by mouth every 8 (eight) hours as needed for severe pain. 01/07/19   Forrest Moron, MD  sertraline (ZOLOFT) 100 MG tablet Take 1 tablet (100 mg total) by mouth daily. 01/07/19   Forrest Moron, MD  simvastatin (ZOCOR) 40 MG tablet TAKE 1 TABLET BY MOUTH  DAILY 06/13/19   Forrest Moron, MD    Allergies    Prozac [fluoxetine hcl]  Review of Systems   Review of Systems  Constitutional: Negative for chills and fever.  HENT: Negative for congestion and sore throat.   Eyes: Negative.   Respiratory: Negative for chest tightness and shortness of breath.   Cardiovascular: Negative for chest pain.  Gastrointestinal: Negative for abdominal pain and nausea.  Genitourinary: Negative.   Musculoskeletal: Positive for myalgias. Negative for arthralgias, joint swelling and neck pain.  Skin: Negative.  Negative for color change, rash and wound.  Neurological: Negative for dizziness, weakness, light-headedness, numbness and headaches.  Psychiatric/Behavioral: Negative.     Physical Exam Updated Vital Signs BP 134/89 (BP Location: Left Arm)   Pulse 60   Temp 97.9 F (36.6 C) (Oral)   Resp 17   Ht 5\' 4"  (1.626 m)   Wt 91.2 kg   SpO2 96%   BMI 34.50 kg/m   Physical Exam Vitals and nursing note reviewed.  Constitutional:      Appearance: She is well-developed.  HENT:     Head: Normocephalic and atraumatic.  Eyes:     Conjunctiva/sclera: Conjunctivae normal.  Cardiovascular:     Rate and Rhythm: Normal rate and regular rhythm.     Heart sounds: Normal heart sounds.  Pulmonary:     Effort: Pulmonary effort is normal.     Breath sounds: Normal breath sounds. No wheezing.  Abdominal:     General: Bowel sounds are normal.     Palpations: Abdomen is soft.     Tenderness: There is no abdominal tenderness.  Musculoskeletal:        General: Tenderness present. No swelling. Normal range of  motion.     Cervical back: Normal range of motion.     Right lower leg: No edema.     Left lower leg: Tenderness present. No swelling. No edema.       Legs:     Comments: Patient is tender to palpation along her proximal medial left gastrocnemius.  There is no peripheral edema or erythema.  Dorsalis pedis pulses are intact.  Positive Homans' sign.  No palpable cords or varicosities.  Thigh is nontender.  Skin:    General: Skin is warm and dry.  Neurological:     Mental Status:  She is alert.     ED Results / Procedures / Treatments   Labs (all labs ordered are listed, but only abnormal results are displayed) Labs Reviewed  BASIC METABOLIC PANEL - Abnormal; Notable for the following components:      Result Value   Glucose, Bld 108 (*)    All other components within normal limits    EKG None  Radiology US Venous Img Lower Unilateral Left (DVT)  Result Date: 07/26/2019 CLINICAL DATA:  Left calf pain x3 days, varicose veins, obesity EXAM: LEFT LOWER EXTREMITY VENOUS DOPPLER ULTRASOUND TECHNIQUE: Gray-scale sonography with compression, as well as color and duplex ultrasound, were performed to evaluate the deep venous system(s) from the level of the common femoral vein through the popliteal and proximal calf veins. COMPARISON:  None. FINDINGS: VENOUS Normal compressibility of the common femoral, superficial femoral, and popliteal veins, as well as the visualized calf veins. Visualized portions of profunda femoral vein and great saphenous vein unremarkable. No filling defects to suggest DVT on grayscale or color Doppler imaging. Doppler waveforms show normal direction of venous flow, normal respiratory phasicity and response to augmentation. Limited views of the contralateral common femoral vein are unremarkable. OTHER None. Limitations: none IMPRESSION: No femoropopliteal DVT nor evidence of DVT within the visualized calf veins. If clinical symptoms are inconsistent or if there are persistent  or worsening symptoms, further imaging (possibly involving the iliac veins) may be warranted. Electronically Signed   By: Corlis Leak M.D.   On: 07/26/2019 11:50    Procedures Procedures (including critical care time)  Medications Ordered in ED Medications - No data to display  ED Course  I have reviewed the triage vital signs and the nursing notes.  Pertinent labs & imaging results that were available during my care of the patient were reviewed by me and considered in my medical decision making (see chart for details).    MDM Rules/Calculators/A&P                      Patient with persistent left calf pain 3 days out from a spontaneous muscle spasm, suspect simple muscle strain, however will send patient for DVT study to rule out this possibility.  [place] results reviewed and negative for DVT.  Suspect source of sx muscle strain.  Advised heat, elevation, nsaids.  Return precautions/follow up with pcp outlined.   Final Clinical Impression(s) / ED Diagnoses Final diagnoses:  Gastrocnemius muscle strain, left, initial encounter    Rx / DC Orders ED Discharge Orders    None       Victoriano Lain 07/27/19 2229    Terald Sleeper, MD 07/27/19 1011

## 2019-09-10 ENCOUNTER — Other Ambulatory Visit: Payer: Self-pay | Admitting: Family Medicine

## 2019-09-10 NOTE — Telephone Encounter (Signed)
Requested Prescriptions  Pending Prescriptions Disp Refills  . simvastatin (ZOCOR) 40 MG tablet [Pharmacy Med Name: SIMVASTATIN  40MG   TAB] 90 tablet 2    Sig: TAKE 1 TABLET BY MOUTH  DAILY     Cardiovascular:  Antilipid - Statins Failed - 09/10/2019 10:30 PM      Failed - Total Cholesterol in normal range and within 360 days    Cholesterol, Total  Date Value Ref Range Status  07/03/2019 207 (H) 100 - 199 mg/dL Final         Failed - LDL in normal range and within 360 days    LDL Chol Calc (NIH)  Date Value Ref Range Status  07/03/2019 134 (H) 0 - 99 mg/dL Final         Passed - HDL in normal range and within 360 days    HDL  Date Value Ref Range Status  07/03/2019 60 >39 mg/dL Final         Passed - Triglycerides in normal range and within 360 days    Triglycerides  Date Value Ref Range Status  07/03/2019 74 0 - 149 mg/dL Final         Passed - Patient is not pregnant      Passed - Valid encounter within last 12 months    Recent Outpatient Visits          2 months ago Gout involving toe, unspecified cause, unspecified chronicity, unspecified laterality   Primary Care at [facility], Zoe A, MD   4 months ago Non-intractable vomiting with nausea, unspecified vomiting type   Primary Care at [facility], Richard, NP   5 months ago Mixed obsessional thoughts and acts   Primary Care at [facility], [facility], NP   6 months ago Type 2 diabetes mellitus with hyperglycemia, without long-term current use of insulin [facility])   Primary Care at [facility], [facility], MD   8 months ago Encounter for health maintenance examination in adult   Primary Care at [facility], MD

## 2019-10-04 ENCOUNTER — Encounter: Payer: Self-pay | Admitting: Family Medicine

## 2019-10-18 ENCOUNTER — Ambulatory Visit: Payer: Medicare Other | Admitting: Family Medicine

## 2019-11-29 ENCOUNTER — Ambulatory Visit: Payer: Medicare Other | Admitting: Registered Nurse

## 2019-12-13 DIAGNOSIS — M79602 Pain in left arm: Secondary | ICD-10-CM | POA: Diagnosis not present

## 2019-12-13 DIAGNOSIS — R0789 Other chest pain: Secondary | ICD-10-CM | POA: Diagnosis not present

## 2019-12-13 DIAGNOSIS — M79603 Pain in arm, unspecified: Secondary | ICD-10-CM | POA: Diagnosis not present

## 2019-12-13 DIAGNOSIS — R079 Chest pain, unspecified: Secondary | ICD-10-CM | POA: Diagnosis not present

## 2019-12-13 DIAGNOSIS — Z79899 Other long term (current) drug therapy: Secondary | ICD-10-CM | POA: Diagnosis not present

## 2019-12-13 DIAGNOSIS — E7800 Pure hypercholesterolemia, unspecified: Secondary | ICD-10-CM | POA: Diagnosis not present

## 2019-12-13 DIAGNOSIS — I10 Essential (primary) hypertension: Secondary | ICD-10-CM | POA: Diagnosis not present

## 2019-12-14 DIAGNOSIS — I10 Essential (primary) hypertension: Secondary | ICD-10-CM | POA: Diagnosis not present

## 2019-12-14 DIAGNOSIS — Z7984 Long term (current) use of oral hypoglycemic drugs: Secondary | ICD-10-CM | POA: Diagnosis not present

## 2019-12-14 DIAGNOSIS — K228 Other specified diseases of esophagus: Secondary | ICD-10-CM | POA: Diagnosis not present

## 2019-12-14 DIAGNOSIS — E785 Hyperlipidemia, unspecified: Secondary | ICD-10-CM | POA: Diagnosis not present

## 2019-12-14 DIAGNOSIS — Z79899 Other long term (current) drug therapy: Secondary | ICD-10-CM | POA: Diagnosis not present

## 2019-12-14 DIAGNOSIS — R0789 Other chest pain: Secondary | ICD-10-CM | POA: Diagnosis not present

## 2019-12-14 DIAGNOSIS — Z7982 Long term (current) use of aspirin: Secondary | ICD-10-CM | POA: Diagnosis not present

## 2019-12-14 DIAGNOSIS — K229 Disease of esophagus, unspecified: Secondary | ICD-10-CM | POA: Diagnosis not present

## 2019-12-14 DIAGNOSIS — M79602 Pain in left arm: Secondary | ICD-10-CM | POA: Diagnosis not present

## 2020-01-28 ENCOUNTER — Other Ambulatory Visit: Payer: Self-pay

## 2020-01-28 ENCOUNTER — Ambulatory Visit (INDEPENDENT_AMBULATORY_CARE_PROVIDER_SITE_OTHER): Payer: Medicare Other | Admitting: Registered Nurse

## 2020-01-28 VITALS — BP 111/74 | HR 70 | Temp 97.8°F | Resp 15 | Ht 64.0 in | Wt 209.0 lb

## 2020-01-28 DIAGNOSIS — R079 Chest pain, unspecified: Secondary | ICD-10-CM

## 2020-01-28 DIAGNOSIS — E1165 Type 2 diabetes mellitus with hyperglycemia: Secondary | ICD-10-CM

## 2020-01-28 NOTE — Patient Instructions (Signed)
° ° ° °  If you have lab work done today you will be contacted with your lab results within the next 2 weeks.  If you have not heard from us then please contact us. The fastest way to get your results is to register for My Chart. ° ° °IF you received an x-ray today, you will receive an invoice from [facility]. Please contact [facility] at [phone] with questions or concerns regarding your invoice.  ° °IF you received labwork today, you will receive an invoice from [facility]. Please contact [facility] at [phone] with questions or concerns regarding your invoice.  ° °Our billing staff will not be able to assist you with questions regarding bills from these companies. ° °You will be contacted with the lab results as soon as they are available. The fastest way to get your results is to activate your My Chart account. Instructions are located on the last page of this paperwork. If you have not heard from us regarding the results in 2 weeks, please contact this office. °  ° ° ° °

## 2020-01-29 LAB — COMPREHENSIVE METABOLIC PANEL
ALT: 13 IU/L (ref 0–32)
AST: 20 IU/L (ref 0–40)
Albumin/Globulin Ratio: 1.7 (ref 1.2–2.2)
Albumin: 4.2 g/dL (ref 3.8–4.8)
Alkaline Phosphatase: 83 IU/L (ref 48–121)
BUN/Creatinine Ratio: 13 (ref 12–28)
BUN: 11 mg/dL (ref 8–27)
Bilirubin Total: 0.2 mg/dL (ref 0.0–1.2)
CO2: 25 mmol/L (ref 20–29)
Calcium: 9.6 mg/dL (ref 8.7–10.3)
Chloride: 108 mmol/L — ABNORMAL HIGH (ref 96–106)
Creatinine, Ser: 0.82 mg/dL (ref 0.57–1.00)
GFR calc Af Amer: 87 mL/min/{1.73_m2} (ref >59)
GFR calc non Af Amer: 76 mL/min/{1.73_m2} (ref >59)
Globulin, Total: 2.5 g/dL (ref 1.5–4.5)
Glucose: 86 mg/dL (ref 65–99)
Potassium: 4.7 mmol/L (ref 3.5–5.2)
Sodium: 146 mmol/L — ABNORMAL HIGH (ref 134–144)
Total Protein: 6.7 g/dL (ref 6.0–8.5)

## 2020-01-29 LAB — LIPID PANEL
Chol/HDL Ratio: 2.9 ratio (ref 0.0–4.4)
Cholesterol, Total: 199 mg/dL (ref 100–199)
HDL: 68 mg/dL (ref >39)
LDL Chol Calc (NIH): 105 mg/dL — ABNORMAL HIGH (ref 0–99)
Triglycerides: 151 mg/dL — ABNORMAL HIGH (ref 0–149)
VLDL Cholesterol Cal: 26 mg/dL (ref 5–40)

## 2020-01-29 LAB — CBC
Hematocrit: 39.6 % (ref 34.0–46.6)
Hemoglobin: 12.9 g/dL (ref 11.1–15.9)
MCH: 28.2 pg (ref 26.6–33.0)
MCHC: 32.6 g/dL (ref 31.5–35.7)
MCV: 87 fL (ref 79–97)
Platelets: 315 10*3/uL (ref 150–450)
RBC: 4.58 x10E6/uL (ref 3.77–5.28)
RDW: 15.6 % — ABNORMAL HIGH (ref 11.7–15.4)
WBC: 8.5 10*3/uL (ref 3.4–10.8)

## 2020-01-29 LAB — HEMOGLOBIN A1C
Est. average glucose Bld gHb Est-mCnc: 128 mg/dL
Hgb A1c MFr Bld: 6.1 % — ABNORMAL HIGH (ref 4.8–5.6)

## 2020-01-29 LAB — D-DIMER, QUANTITATIVE: D-DIMER: 0.83 mg/L FEU — ABNORMAL HIGH (ref 0.00–0.49)

## 2020-01-29 LAB — BRAIN NATRIURETIC PEPTIDE: BNP: 8.2 pg/mL (ref 0.0–100.0)

## 2020-01-30 ENCOUNTER — Encounter: Payer: Self-pay | Admitting: Registered Nurse

## 2020-02-10 ENCOUNTER — Encounter (INDEPENDENT_AMBULATORY_CARE_PROVIDER_SITE_OTHER): Payer: Self-pay

## 2020-02-17 ENCOUNTER — Encounter: Payer: Self-pay | Admitting: Registered Nurse

## 2020-02-17 NOTE — Progress Notes (Signed)
Acute Office Visit  Subjective:    Patient ID: Christine Hopkins, female    DOB: 1955-11-03, 64 y.o.   MRN: [id number]  Chief Complaint  Patient presents with  . Referral    pt needs a referral to Cards [place] [facility] [place] to concern for heart attck, was cleared but told to F/u, pt was told to start nexium for refluz, pt needs order for cards, stress test, and other testing for blood clots     HPI Patient is in today for cardiology referral. Was seen in ED for chest pain and given clearance based on their assessment, but was told to follow up in primary care and get referred to cardiology  No concerns or complaints today  Has started on nexium for gerd  No further concerns  Past Medical History:  Diagnosis Date  . Anxiety   . Arthritis   . Depression   . Diabetes mellitus without complication (HCC)   . GERD (gastroesophageal reflux disease)   . History of back surgery   . Hyperlipidemia   . Hypertension   . Neuromuscular disorder (HCC)   . OCD (obsessive compulsive disorder) 04/05/2019    Past Surgical History:  Procedure Laterality Date  . FRACTURE SURGERY    . JOINT REPLACEMENT    . SPINE SURGERY      Family History  Problem Relation Age of Onset  . Depression Father   . Mental illness Sister   . Depression Sister   . Schizophrenia Sister   . Diabetes Brother   . Hyperlipidemia Brother   . Mental illness Brother   . Depression Brother   . Schizophrenia Brother   . Heart disease Brother   . Diabetes Brother   . Diabetes Maternal Grandmother   . Heart disease Maternal Grandfather   . Heart disease Paternal Grandmother     Social History   Socioeconomic History  . Marital status: Single    Spouse name: Not on file  . Number of children: Not on file  . Years of education: Not on file  . Highest education level: Not on file  Occupational History  . Not on file  Tobacco Use  . Smoking status: Never Smoker  . Smokeless tobacco: Never  Used  Vaping Use  . Vaping Use: Never used  Substance and Sexual Activity  . Alcohol use: No  . Drug use: No  . Sexual activity: Not Currently  Other Topics Concern  . Not on file  Social History Narrative  . Not on file   Social Determinants of Health   Financial Resource Strain:   . Difficulty of Paying Living Expenses: Not on file  Food Insecurity:   . Worried About Programme researcher, broadcasting/film/video in the Last Year: Not on file  . Ran Out of Food in the Last Year: Not on file  Transportation Needs:   . Lack of Transportation (Medical): Not on file  . Lack of Transportation (Non-Medical): Not on file  Physical Activity:   . Days of Exercise per Week: Not on file  . Minutes of Exercise per Session: Not on file  Stress:   . Feeling of Stress : Not on file  Social Connections:   . Frequency of Communication with Friends and Family: Not on file  . Frequency of Social Gatherings with Friends and Family: Not on file  . Attends Religious Services: Not on file  . Active Member of Clubs or Organizations: Not on file  .  Attends Banker Meetings: Not on file  . Marital Status: Not on file  Intimate Partner Violence:   . Fear of Current or Ex-Partner: Not on file  . Emotionally Abused: Not on file  . Physically Abused: Not on file  . Sexually Abused: Not on file    Outpatient Medications Prior to Visit  Medication Sig Dispense Refill  . allopurinol (ZYLOPRIM) 300 MG tablet Take 1 tablet (300 mg total) by mouth daily. 90 tablet 1  . colchicine 0.6 MG tablet Take 1 tablet bid for the 5 days for gout attack 10 tablet 0  . esomeprazole (NEXIUM) 20 MG packet Take 20 mg by mouth daily before breakfast.    . ibuprofen (ADVIL,MOTRIN) 800 MG tablet Take 1 tablet (800 mg total) by mouth 3 (three) times daily. 90 tablet 0  . indomethacin (INDOCIN) 50 MG capsule Take 1 capsule (50 mg total) by mouth 3 (three) times daily as needed. 90 capsule 1  . lisinopril (ZESTRIL) 20 MG tablet Take 1  tablet (20 mg total) by mouth daily. 90 tablet 1  . meloxicam (MOBIC) 7.5 MG tablet Take 1 tablet (7.5 mg total) by mouth daily. 30 tablet 0  . metFORMIN (GLUCOPHAGE) 1000 MG tablet Take 1 tablet (1,000 mg total) by mouth 2 (two) times daily with a meal. 180 tablet 3  . ondansetron (ZOFRAN) 8 MG tablet Take 1 tablet (8 mg total) by mouth every 8 (eight) hours as needed for nausea or vomiting. 20 tablet 0  . oxyCODONE-acetaminophen (PERCOCET/ROXICET) 5-325 MG tablet Take 1 tablet by mouth every 8 (eight) hours as needed for severe pain. 15 tablet 0  . sertraline (ZOLOFT) 100 MG tablet Take 1 tablet (100 mg total) by mouth daily. 90 tablet 3  . simvastatin (ZOCOR) 40 MG tablet TAKE 1 TABLET BY MOUTH  DAILY 90 tablet 2  . OLANZapine (ZYPREXA) 5 MG tablet Take 1 tablet (5 mg total) by mouth at bedtime. 30 tablet 3  . OLANZapine (ZYPREXA) 5 MG tablet Take 1 tablet (5 mg total) by mouth at bedtime. 90 tablet 1   No facility-administered medications prior to visit.    Allergies  Allergen Reactions  . Prozac [Fluoxetine Hcl] Hives    Review of Systems  Constitutional: Negative.   HENT: Negative.   Eyes: Negative.   Respiratory: Negative.   Cardiovascular: Negative.   Gastrointestinal: Negative.   Endocrine: Negative.   Genitourinary: Negative.   Musculoskeletal: Negative.   Skin: Negative.   Allergic/Immunologic: Negative.   Neurological: Negative.   Hematological: Negative.   Psychiatric/Behavioral: Negative.   All other systems reviewed and are negative.      Objective:    Physical Exam Vitals and nursing note reviewed.  Constitutional:      Appearance: Normal appearance. She is obese.  Cardiovascular:     Rate and Rhythm: Normal rate and regular rhythm.     Pulses: Normal pulses.     Heart sounds: Normal heart sounds. No murmur heard.  No friction rub. No gallop.   Pulmonary:     Effort: Pulmonary effort is normal. No respiratory distress.     Breath sounds: Normal breath  sounds. No stridor. No wheezing, rhonchi or rales.  Chest:     Chest wall: No tenderness.  Skin:    General: Skin is warm and dry.     Capillary Refill: Capillary refill takes less than 2 seconds.     Coloration: Skin is not jaundiced or pale.     Findings: No  bruising, erythema, lesion or rash.  Neurological:     General: No focal deficit present.     Mental Status: She is alert and oriented to person, place, and time. Mental status is at baseline.  Psychiatric:        Mood and Affect: Mood normal.        Behavior: Behavior normal.        Thought Content: Thought content normal.        Judgment: Judgment normal.     BP 111/74   Pulse 70   Temp 97.8 F (36.6 C) (Temporal)   Resp 15   Ht 5\' 4"  (1.626 m)   Wt 209 lb (94.8 kg)   SpO2 100%   BMI 35.87 kg/m  Wt Readings from Last 3 Encounters:  01/28/20 209 lb (94.8 kg)  07/26/19 201 lb (91.2 kg)  07/03/19 207 lb 12.8 oz (94.3 kg)    Health Maintenance Due  Topic Date Due  . OPHTHALMOLOGY EXAM  Never done  . COVID-19 Vaccine (2 - Pfizer 2-dose series) 01/27/2020    There are no preventive care reminders to display for this patient.   Lab Results  Component Value Date   TSH 1.820 07/03/2019   Lab Results  Component Value Date   WBC 8.5 01/28/2020   HGB 12.9 01/28/2020   HCT 39.6 01/28/2020   MCV 87 01/28/2020   PLT 315 01/28/2020   Lab Results  Component Value Date   NA 146 (H) 01/28/2020   K 4.7 01/28/2020   CO2 25 01/28/2020   GLUCOSE 86 01/28/2020   BUN 11 01/28/2020   CREATININE 0.82 01/28/2020   BILITOT 0.2 01/28/2020   ALKPHOS 83 01/28/2020   AST 20 01/28/2020   ALT 13 01/28/2020   PROT 6.7 01/28/2020   ALBUMIN 4.2 01/28/2020   CALCIUM 9.6 01/28/2020   ANIONGAP 7 07/26/2019   Lab Results  Component Value Date   CHOL 199 01/28/2020   Lab Results  Component Value Date   HDL 68 01/28/2020   Lab Results  Component Value Date   LDLCALC 105 (H) 01/28/2020   Lab Results  Component Value  Date   TRIG 151 (H) 01/28/2020   Lab Results  Component Value Date   CHOLHDL 2.9 01/28/2020   Lab Results  Component Value Date   HGBA1C 6.1 (H) 01/28/2020       Assessment & Plan:   Problem List Items Addressed This Visit      Endocrine   Type 2 diabetes mellitus with hyperglycemia, without long-term current use of insulin (HCC)   Relevant Orders   Hemoglobin A1c (Completed)   Lipid Panel (Completed)    Other Visit Diagnoses    Chest pain, unspecified type    -  Primary   Relevant Orders   Hemoglobin A1c (Completed)   Lipid Panel (Completed)   Comprehensive metabolic panel (Completed)   CBC (Completed)   Brain natriuretic peptide (Completed)   D-dimer, quantitative (not at [facility]) (Completed)       No orders of the defined types were placed in this encounter.  PLAN  Will draw labs including ddimer and bnp for dvt or cardiac tissue damage  Will refer to cardiology  Return precautions and hospital precautions reviewed  Patient encouraged to call clinic with any questions, comments, or concerns.  [facility], NP

## 2020-02-17 NOTE — Telephone Encounter (Signed)
Pt waiting on cards to call for stress test but I see no order was she meant to be referred?

## 2020-03-02 ENCOUNTER — Ambulatory Visit: Payer: Medicare Other | Admitting: Internal Medicine

## 2020-03-20 ENCOUNTER — Encounter: Payer: Self-pay | Admitting: Registered Nurse

## 2020-03-20 DIAGNOSIS — I10 Essential (primary) hypertension: Secondary | ICD-10-CM

## 2020-03-20 DIAGNOSIS — M109 Gout, unspecified: Secondary | ICD-10-CM

## 2020-03-23 NOTE — Telephone Encounter (Signed)
Pt requesting refill 4 rx last filled by [facility], you saw pt 01/28/2020 okay to send to [facility]? Or needs OV first?

## 2020-03-24 MED ORDER — LISINOPRIL 20 MG PO TABS: 20.0000 mg | ORAL_TABLET | Freq: Every day | ORAL | 1 refills | Status: DC

## 2020-03-24 MED ORDER — ESOMEPRAZOLE MAGNESIUM 20 MG PO PACK: 20.0000 mg | PACK | Freq: Every day | ORAL | 3 refills | Status: DC

## 2020-03-24 MED ORDER — ALLOPURINOL 300 MG PO TABS: 300.0000 mg | ORAL_TABLET | Freq: Every day | ORAL | 1 refills | Status: DC

## 2020-03-24 MED ORDER — SERTRALINE HCL 100 MG PO TABS: 100.0000 mg | ORAL_TABLET | Freq: Every day | ORAL | 3 refills | Status: DC

## 2020-03-26 ENCOUNTER — Telehealth: Payer: Self-pay

## 2020-03-26 NOTE — Telephone Encounter (Signed)
Fax sent from pharmacy that the Rx for Nexium DR Packets is not covered by pt insurance and they requested you send an alternative. Suggested alternatives from the pharmacy are Lansoprazole tab which they mark as less expensive and Esomeprazole tab which they marked as the more expensive of the two.  Please send new Rx or advise what one you would prefer and I can send

## 2020-03-27 ENCOUNTER — Ambulatory Visit: Payer: Medicare Other | Admitting: Internal Medicine

## 2020-04-02 ENCOUNTER — Other Ambulatory Visit: Payer: Self-pay | Admitting: Registered Nurse

## 2020-04-02 DIAGNOSIS — Z760 Encounter for issue of repeat prescription: Secondary | ICD-10-CM

## 2020-04-02 MED ORDER — LANSOPRAZOLE 30 MG PO CPDR: 30.0000 mg | DELAYED_RELEASE_CAPSULE | Freq: Every day | ORAL | 11 refills | Status: DC

## 2020-04-12 NOTE — Progress Notes (Signed)
Cardiology Office Note:    Date:  04/14/2020   ID:  Christine Hopkins, DOB Apr 12, 1956, MRN [id number]  PCP:  No primary care provider on file.  [facility] Cardiologist:  Freada Bergeron, MD  [facility] Electrophysiologist:  None   Referring MD: Maximiano Coss, NP     History of Present Illness:    Christine Hopkins is a 64 y.o. female with a hx of anxiety, depression, DMII, HLD, and HTN who was referred by Maximiano Coss, NP for evaluation of chest pain.  Patient was visiting [place] in July and she developed pains in her jaw, neck, and left arm. Was having intermittent pain that began at rest, but worsened during the day prompting her to be evaluated. She went to the local heart hospital where work-up was reassuringly normal per reprot. She was recommended for stress testing when she returned back to [place].  Today, she states that her pain has not recurred since being back. No chest pain, jaw pain or arm pain with exertion (walks 0.31mles every other day). Has cut back on walking due to concern for the pain. No palpitations, nausea, vomiting, orthopnea, PND, LE edema. Does snore and feels very fatigued during the day.  No known history of CAD. Mother and father passed away when she was young and she does not know anything about their medical history. Brothers are obese with diabetes.   TC 199, HDL 68, LDL 105, TG 151 (01/2020), A1C 6.1, TSH 1.82.   Past Medical History:  Diagnosis Date  . Anxiety   . Arthritis   . Depression   . Diabetes mellitus without complication ([place]   . GERD (gastroesophageal reflux disease)   . History of back surgery   . Hyperlipidemia   . Hypertension   . [place]romuscular disorder ([place]   . OCD (obsessive compulsive disorder) 04/05/2019    Past Surgical History:  Procedure Laterality Date  . FRACTURE SURGERY    . JOINT REPLACEMENT    . SPINE SURGERY      Current Medications: Current Meds  Medication Sig  . allopurinol (ZYLOPRIM) 300 MG  tablet Take 1 tablet (300 mg total) by mouth daily.  . colchicine 0.6 MG tablet Take 1 tablet bid for the 5 days for gout attack  . ibuprofen (ADVIL,MOTRIN) 800 MG tablet Take 1 tablet (800 mg total) by mouth 3 (three) times daily.  . indomethacin (INDOCIN) 50 MG capsule Take 1 capsule (50 mg total) by mouth 3 (three) times daily as needed.  . lansoprazole (PREVACID) 30 MG capsule Take 1 capsule (30 mg total) by mouth daily at 12 noon.  .Marland Kitchenlisinopril (ZESTRIL) 20 MG tablet Take 1 tablet (20 mg total) by mouth daily.  . meloxicam (MOBIC) 7.5 MG tablet Take 1 tablet (7.5 mg total) by mouth daily.  . metFORMIN (GLUCOPHAGE) 1000 MG tablet Take 1 tablet (1,000 mg total) by mouth 2 (two) times daily with a meal.  . OLANZapine (ZYPREXA) 5 MG tablet Take 1 tablet (5 mg total) by mouth at bedtime.  .Marland KitchenOLANZapine (ZYPREXA) 5 MG tablet Take 1 tablet (5 mg total) by mouth at bedtime.  . ondansetron (ZOFRAN) 8 MG tablet Take 1 tablet (8 mg total) by mouth every 8 (eight) hours as needed for nausea or vomiting.  .Marland KitchenoxyCODONE-acetaminophen (PERCOCET/ROXICET) 5-325 MG tablet Take 1 tablet by mouth every 8 (eight) hours as needed for severe pain.  .Marland Kitchensertraline (ZOLOFT) 100 MG tablet Take 1 tablet (100 mg total) by mouth daily.  . simvastatin (  ZOCOR) 40 MG tablet TAKE 1 TABLET BY MOUTH  DAILY     Allergies:   Prozac [fluoxetine hcl]   Social History   Socioeconomic History  . Marital status: Single    Spouse name: Not on file  . Number of children: Not on file  . Years of education: Not on file  . Highest education level: Not on file  Occupational History  . Not on file  Tobacco Use  . Smoking status: Never Smoker  . Smokeless tobacco: Never Used  Vaping Use  . Vaping Use: Never used  Substance and Sexual Activity  . Alcohol use: No  . Drug use: No  . Sexual activity: Not Currently  Other Topics Concern  . Not on file  Social History Narrative  . Not on file   Social Determinants of Health    Financial Resource Strain:   . Difficulty of Paying Living Expenses: Not on file  Food Insecurity:   . Worried About Charity fundraiser in the Last Year: Not on file  . Ran Out of Food in the Last Year: Not on file  Transportation Needs:   . Lack of Transportation (Medical): Not on file  . Lack of Transportation (Non-Medical): Not on file  Physical Activity:   . Days of Exercise per Week: Not on file  . Minutes of Exercise per Session: Not on file  Stress:   . Feeling of Stress : Not on file  Social Connections:   . Frequency of Communication with Friends and Family: Not on file  . Frequency of Social Gatherings with Friends and Family: Not on file  . Attends Religious Services: Not on file  . Active Member of Clubs or Organizations: Not on file  . Attends Archivist Meetings: Not on file  . Marital Status: Not on file     Family History: The patient's family history includes Depression in her brother, father, and sister; Diabetes in her brother, brother, and maternal grandmother; Heart disease in her brother, maternal grandfather, and paternal grandmother; Hyperlipidemia in her brother; Mental illness in her brother and sister; Schizophrenia in her brother and sister.  ROS:   Please see the history of present illness.    Review of Systems  Constitutional: Negative for chills and fever.  HENT: Negative for sore throat.   Eyes: Negative for blurred vision.  Respiratory: Positive for shortness of breath. Negative for cough.   Cardiovascular: Positive for chest pain. Negative for palpitations, orthopnea, claudication and PND.  Gastrointestinal: Positive for heartburn and vomiting. Negative for blood in stool.  Genitourinary: Negative for hematuria.  Musculoskeletal: Positive for myalgias.  Skin: Negative for rash.  Neurological: Negative for dizziness and loss of consciousness.  Psychiatric/Behavioral: Negative for substance abuse.    EKGs/Labs/Other Studies  Reviewed:    The following studies were reviewed today: 07/26/19 Lower extremity venous ultrasound:  FINDINGS: VENOUS Normal compressibility of the common femoral, superficial femoral, and popliteal veins, as well as the visualized calf veins. Visualized portions of profunda femoral vein and great saphenous vein unremarkable. No filling defects to suggest DVT on grayscale or color Doppler imaging. Doppler waveforms show normal direction of venous flow, normal respiratory phasicity and response to augmentation.  Limited views of the contralateral common femoral vein are unremarkable.  OTHER  None.  Limitations: none  IMPRESSION: No femoropopliteal DVT nor evidence of DVT within the visualized calf veins.  If clinical symptoms are inconsistent or if there are persistent or worsening symptoms, further imaging (possibly  involving the iliac veins) may be warranted.  CTA chest 11/2019: FINDINGS:  LOWER NECK AND AXILLA: There is no lymphadenopathy. The thyroid gland appears normal.   HEART AND VESSELS: Heart size appears enlarged. There is no pericardial effusion. Scattered coronary artery calcifications are identified. Scattered aortic calcifications are present. The aorta and great vessels are normal in caliber.  The main pulmonary artery is normal in caliber. There are no central filling defects to suggest central pulmonary embolism.   MEDIASTINUM AND HILA: There is no lymphadenopathy or mass. Benign calcified lymph nodes are present. The esophagus is markedly dilated and thick walled.   LUNG AND AIRWAYS: No focal airspace consolidation is seen. Minimal bibasilar dependent atelectasis is identified. The central airways are patent. There is no bronchiectasis or honeycombing. No suspicious nodule or mass.   PLEURA: There is no pneumothorax or pleural effusion.   BONES AND CHEST WALL: Soft tissue structures appear grossly normal. Osseous structures appear  intact. There is multilevel vertebral body spurring.   UPPER ABDOMEN: Visualized upper abdominal structures appear grossly normal.   IMPRESSION:  1. No evidence of acute cardiopulmonary abnormality. No pulmonary embolus or focal airspace disease.  2. Cardiomegaly.  3. Markedly dilated thick-walled esophagus.     EKG:  EKG is  ordered today.  The ekg ordered today demonstrates NSR with HR 64  Recent Labs: 07/03/2019: TSH 1.820 01/28/2020: ALT 13; BNP 8.2; BUN 11; Creatinine, Ser 0.82; Hemoglobin 12.9; Platelets 315; Potassium 4.7; Sodium 146  Recent Lipid Panel    Component Value Date/Time   CHOL 199 01/28/2020 1611   TRIG 151 (H) 01/28/2020 1611   HDL 68 01/28/2020 1611   CHOLHDL 2.9 01/28/2020 1611   LDLCALC 105 (H) 01/28/2020 1611     Physical Exam:    VS:  BP 124/62   Pulse 65   Ht '5\' 4"'  (1.626 m)   Wt 208 lb (94.3 kg)   SpO2 94%   BMI 35.70 kg/m     Wt Readings from Last 3 Encounters:  04/14/20 208 lb (94.3 kg)  01/28/20 209 lb (94.8 kg)  07/26/19 201 lb (91.2 kg)     GEN:  Well nourished, well developed in no acute distress HEENT: Normal NECK: No JVD; No carotid bruits LYMPHATICS: No lymphadenopathy CARDIAC: RRR, 2/6 systolic murmur best heard at RUSB, No rubs, gallops RESPIRATORY:  Clear to auscultation without rales, wheezing or rhonchi  ABDOMEN: Soft, non-tender, non-distended MUSCULOSKELETAL:  No edema; No deformity  SKIN: Warm and dry NEUROLOGIC:  Alert and oriented x 3 PSYCHIATRIC:  Normal affect   ASSESSMENT:    1. Precordial pain   2. Essential hypertension   3. Pure hypercholesterolemia    PLAN:    In order of problems listed above:  #Chest pain: #Coronary calcifications: Patient with episode of neck, arm, jaw pain that occurred at rest while visiting [place]. Went to OSH ED where work-up was reportedly normal. Was instructed to get stress test when she returned to [place]. Since that episode, she has not had recurrence of symptoms. She walks  0.5mles every other day and has no exertional symptoms. CTA notable for coronary calcification. 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  -Lisinopril 64m68mDaytime somnolence: #Snoring: -Check sleep study  #HLD Managed by PCP. Last LDL 105. -Continue simvastatin 40mg76mly -Follow-up CTA and adjust accordingly  Medication  Adjustments/Labs and Tests Ordered: Current medicines are reviewed at length with the patient today.  Concerns regarding medicines are outlined above.  Orders Placed This Encounter  Procedures  . CT CORONARY MORPH W/CTA COR W/SCORE W/CA W/CM &/OR WO/CM  . CT CORONARY FRACTIONAL FLOW RESERVE DATA PREP  . CT CORONARY FRACTIONAL FLOW RESERVE FLUID ANALYSIS  . Basic metabolic panel  . Ambulatory referral to Gastroenterology  . ECHOCARDIOGRAM COMPLETE  . Home sleep test   Meds ordered this encounter  Medications  . metoprolol tartrate (LOPRESSOR) 100 MG tablet    Sig: Take 1 tablet (100 mg total) two hours prior to CT scan.    Dispense:  1 tablet    Refill:  0    Patient Instructions  Medication Instructions:  Your physician recommends that you continue on your current medications as directed. Please refer to the Current Medication list given to you today.  *If you need a refill on your cardiac medications before your next appointment, please call your pharmacy*  Testing/Procedures: Your physician has requested that you have an echocardiogram. Echocardiography is a painless test that uses sound waves to create images of your heart. It provides your doctor with information about the size and shape of your heart and how well your heart's chambers and valves are working. This procedure takes approximately one hour. There are no restrictions for this procedure.  Your physician  has requested that you have a coronary CTA scan. Please see below for further instructions.   Follow-Up: At [facility], you and your health needs are our priority.  As part of our continuing mission to provide you with exceptional heart care, we have created designated Provider Care Teams.  These Care Teams include your primary Cardiologist (physician) and Advanced Practice Providers (APPs -  Physician Assistants and Nurse Practitioners) who all work together to provide you with the care you need, when you need it.  Your next appointment:   3 month(s)  The format for your next appointment:   In Person  Provider:   You may see Freada Bergeron, MD or one of the following Advanced Practice Providers on your designated Care Team:    Richardson Dopp, PA-C  Vin Bhagat, [place]   You have been referred to see a gastroenterologist.   Other Instructions Your cardiac CT will be scheduled at:   [facility] [address] [place], [place] [address] [phone]  Please arrive at the [facility] main entrance of [facility] 30 minutes prior to test start time. Proceed to the [facility] Radiology Department (first floor) to check-in and test prep.  Please follow these instructions carefully (unless otherwise directed):  On the Night Before the Test: . Be sure to Drink plenty of water. . Do not consume any caffeinated/decaffeinated beverages or chocolate 12 hours prior to your test. . Do not take any antihistamines 12 hours prior to your test.  On the Day of the Test: . Drink plenty of water. Do not drink any water within one hour of the test. . Do not eat any food 4 hours prior to the test. . You may take your regular medications prior to the test.  . Take metoprolol (Lopressor) two hours prior to test. . FEMALES- please wear underwire-free bra if available      After the Test: . Drink plenty of water. . After receiving IV contrast, you may experience a mild  flushed feeling. This is normal. . On occasion, you may experience a  mild rash up to 24 hours after the test. This is not dangerous. If this occurs, you can take Benadryl 25 mg and increase your fluid intake. . If you experience trouble breathing, this can be serious. If it is severe call 911 IMMEDIATELY. If it is mild, please call our office. . If you take any of these medications: Glipizide/Metformin, Avandament, Glucavance, please do not take 48 hours after completing test unless otherwise instructed.   Once we have confirmed authorization from your insurance company, we will call you to set up a date and time for your test. Based on how quickly your insurance processes prior authorizations requests, please allow up to 4 weeks to be contacted for scheduling your Cardiac CT appointment. Be advised that routine Cardiac CT appointments could be scheduled as many as 8 weeks after your provider has ordered it.  For non-scheduling related questions, please contact the cardiac imaging nurse navigator should you have any questions/concerns: Marchia Bond, Cardiac Imaging Nurse Navigator Burley Saver, Interim Cardiac Imaging Nurse [place] and Vascular Services Direct Office Dial: [phone]   For scheduling needs, including cancellations and rescheduling, please call Vivien Rota at [phone], option 3.        Signed, Freada Bergeron, MD  04/14/2020 2:56 PM    [place]

## 2020-04-14 ENCOUNTER — Telehealth: Payer: Self-pay | Admitting: *Deleted

## 2020-04-14 ENCOUNTER — Encounter: Payer: Self-pay | Admitting: Cardiology

## 2020-04-14 ENCOUNTER — Other Ambulatory Visit: Payer: Self-pay

## 2020-04-14 ENCOUNTER — Ambulatory Visit (INDEPENDENT_AMBULATORY_CARE_PROVIDER_SITE_OTHER): Payer: Medicare Other | Admitting: Cardiology

## 2020-04-14 VITALS — BP 124/62 | HR 65 | Ht 64.0 in | Wt 208.0 lb

## 2020-04-14 DIAGNOSIS — E7800 Pure hypercholesterolemia, unspecified: Secondary | ICD-10-CM

## 2020-04-14 DIAGNOSIS — I10 Essential (primary) hypertension: Secondary | ICD-10-CM | POA: Diagnosis not present

## 2020-04-14 DIAGNOSIS — R072 Precordial pain: Secondary | ICD-10-CM

## 2020-04-14 MED ORDER — METOPROLOL TARTRATE 100 MG PO TABS: ORAL_TABLET | ORAL | 0 refills | Status: DC

## 2020-04-14 NOTE — Patient Instructions (Addendum)
Medication Instructions:  Your physician recommends that you continue on your current medications as directed. Please refer to the Current Medication list given to you today.  *If you need a refill on your cardiac medications before your next appointment, please call your pharmacy*  Testing/Procedures: Your physician has requested that you have an echocardiogram. Echocardiography is a painless test that uses sound waves to create images of your heart. It provides your doctor with information about the size and shape of your heart and how well your heart's chambers and valves are working. This procedure takes approximately one hour. There are no restrictions for this procedure.  Your physician has requested that you have a coronary CTA scan. Please see below for further instructions.   Follow-Up: At [facility], you and your health needs are our priority.  As part of our continuing mission to provide you with exceptional heart care, we have created designated Provider Care Teams.  These Care Teams include your primary Cardiologist (physician) and Advanced Practice Providers (APPs -  Physician Assistants and Nurse Practitioners) who all work together to provide you with the care you need, when you need it.  Your next appointment:   3 month(s)  The format for your next appointment:   In Person  Provider:   You may see Freada Bergeron, MD or one of the following Advanced Practice Providers on your designated Care Team:    Richardson Dopp, PA-C  Vin Bhagat, [place]   You have been referred to see a gastroenterologist.   Other Instructions Your cardiac CT will be scheduled at:   [facility] [address] [place], [place] [address] [phone]  Please arrive at the [facility] main entrance of [facility] 30 minutes prior to test start time. Proceed to the [facility] Radiology Department (first floor) to check-in and test prep.  Please follow these  instructions carefully (unless otherwise directed):  On the Night Before the Test: . Be sure to Drink plenty of water. . Do not consume any caffeinated/decaffeinated beverages or chocolate 12 hours prior to your test. . Do not take any antihistamines 12 hours prior to your test.  On the Day of the Test: . Drink plenty of water. Do not drink any water within one hour of the test. . Do not eat any food 4 hours prior to the test. . You may take your regular medications prior to the test.  . Take metoprolol (Lopressor) two hours prior to test. . FEMALES- please wear underwire-free bra if available      After the Test: . Drink plenty of water. . After receiving IV contrast, you may experience a mild flushed feeling. This is normal. . On occasion, you may experience a mild rash up to 24 hours after the test. This is not dangerous. If this occurs, you can take Benadryl 25 mg and increase your fluid intake. . If you experience trouble breathing, this can be serious. If it is severe call 911 IMMEDIATELY. If it is mild, please call our office. . If you take any of these medications: Glipizide/Metformin, Avandament, Glucavance, please do not take 48 hours after completing test unless otherwise instructed.   Once we have confirmed authorization from your insurance company, we will call you to set up a date and time for your test. Based on how quickly your insurance processes prior authorizations requests, please allow up to 4 weeks to be contacted for scheduling your Cardiac CT appointment. Be advised that routine Cardiac  CT appointments could be scheduled as many as 8 weeks after your provider has ordered it.  For non-scheduling related questions, please contact the cardiac imaging nurse navigator should you have any questions/concerns: Marchia Bond, Cardiac Imaging Nurse Navigator Burley Saver, Interim Cardiac Imaging Nurse [place] and Vascular Services Direct Office Dial:  [phone]   For scheduling needs, including cancellations and rescheduling, please call Vivien Rota at [phone], option 3.

## 2020-04-14 NOTE — Telephone Encounter (Signed)
-----   Message from Theresia Majors, RN sent at 04/14/2020  3:00 PM EDT ----- Regarding: Home sleep study Home sleep study has been ordered.  Thanks!

## 2020-04-16 NOTE — Addendum Note (Signed)
Addended by: Dareen Piano on: 04/16/2020 12:34 PM   Modules accepted: Orders

## 2020-04-17 ENCOUNTER — Other Ambulatory Visit: Payer: Self-pay | Admitting: Cardiology

## 2020-04-17 ENCOUNTER — Telehealth: Payer: Self-pay | Admitting: Cardiology

## 2020-04-17 DIAGNOSIS — R072 Precordial pain: Secondary | ICD-10-CM

## 2020-04-17 NOTE — Telephone Encounter (Signed)
Selease is calling from [facility] stating the pt is currently there and was advised she can go to any [facility] for blood work, but they do not have an order. She is requesting that be faxed to [phone]. Please advise.

## 2020-04-17 NOTE — Telephone Encounter (Signed)
Orders have been faxed and released.

## 2020-04-18 LAB — BASIC METABOLIC PANEL
BUN/Creatinine Ratio: 18 (ref 12–28)
BUN: 13 mg/dL (ref 8–27)
CO2: 27 mmol/L (ref 20–29)
Calcium: 9.6 mg/dL (ref 8.7–10.3)
Chloride: 109 mmol/L — ABNORMAL HIGH (ref 96–106)
Creatinine, Ser: 0.71 mg/dL (ref 0.57–1.00)
GFR calc Af Amer: 104 mL/min/{1.73_m2} (ref >59)
GFR calc non Af Amer: 90 mL/min/{1.73_m2} (ref >59)
Glucose: 101 mg/dL — ABNORMAL HIGH (ref 65–99)
Potassium: 4.4 mmol/L (ref 3.5–5.2)
Sodium: 146 mmol/L — ABNORMAL HIGH (ref 134–144)

## 2020-04-18 LAB — SPECIMEN STATUS REPORT

## 2020-05-04 ENCOUNTER — Ambulatory Visit (HOSPITAL_COMMUNITY): Payer: Medicare Other | Attending: Cardiology

## 2020-05-04 ENCOUNTER — Other Ambulatory Visit: Payer: Self-pay

## 2020-05-04 DIAGNOSIS — R072 Precordial pain: Secondary | ICD-10-CM | POA: Insufficient documentation

## 2020-05-04 LAB — ECHOCARDIOGRAM COMPLETE
Area-P 1/2: 3.17 cm2
S' Lateral: 2.5 cm

## 2020-05-08 ENCOUNTER — Telehealth: Payer: Self-pay | Admitting: Cardiology

## 2020-05-08 MED ORDER — METOPROLOL TARTRATE 100 MG PO TABS: ORAL_TABLET | ORAL | 0 refills | Status: DC

## 2020-05-08 NOTE — Telephone Encounter (Signed)
Patient would like to know if she needs to have additional lab work prior to CT scheduled for 05/27/20. She already completed BMET on 04/17/20. Please advise.

## 2020-05-08 NOTE — Telephone Encounter (Signed)
Spoke with the pt and informed her that we will have to have another BMET on her, per protocol.  Informed the pt that a BMET must be checked within 30 days of scheduled CTA.  Scheduled the pt to come in for lab to check a BMET on next Tuesday 05/12/20.  Pt states she will need her one time dose of metoprolol sent into her confirmed pharmacy of choice, to take prior to her CTA.  Informed the pt that I will send this in right now.  Pt verbalized understanding and agrees with this plan. Pt was more than gracious for all the assistance provided.

## 2020-05-11 ENCOUNTER — Ambulatory Visit (HOSPITAL_BASED_OUTPATIENT_CLINIC_OR_DEPARTMENT_OTHER): Payer: Medicare Other | Attending: Cardiology | Admitting: Cardiology

## 2020-05-11 ENCOUNTER — Other Ambulatory Visit: Payer: Self-pay

## 2020-05-11 DIAGNOSIS — G4733 Obstructive sleep apnea (adult) (pediatric): Secondary | ICD-10-CM

## 2020-05-11 DIAGNOSIS — R0902 Hypoxemia: Secondary | ICD-10-CM | POA: Diagnosis not present

## 2020-05-11 DIAGNOSIS — R072 Precordial pain: Secondary | ICD-10-CM | POA: Diagnosis not present

## 2020-05-12 ENCOUNTER — Other Ambulatory Visit: Payer: Self-pay | Admitting: *Deleted

## 2020-05-12 ENCOUNTER — Other Ambulatory Visit: Payer: Medicare Other | Admitting: *Deleted

## 2020-05-12 DIAGNOSIS — R072 Precordial pain: Secondary | ICD-10-CM

## 2020-05-12 LAB — BASIC METABOLIC PANEL
BUN/Creatinine Ratio: 17 (ref 12–28)
BUN: 13 mg/dL (ref 8–27)
CO2: 26 mmol/L (ref 20–29)
Calcium: 9.4 mg/dL (ref 8.7–10.3)
Chloride: 107 mmol/L — ABNORMAL HIGH (ref 96–106)
Creatinine, Ser: 0.76 mg/dL (ref 0.57–1.00)
GFR calc Af Amer: 96 mL/min/{1.73_m2} (ref >59)
GFR calc non Af Amer: 83 mL/min/{1.73_m2} (ref >59)
Glucose: 117 mg/dL — ABNORMAL HIGH (ref 65–99)
Potassium: 4.3 mmol/L (ref 3.5–5.2)
Sodium: 145 mmol/L — ABNORMAL HIGH (ref 134–144)

## 2020-05-12 NOTE — Procedures (Signed)
   Patient Name: Christine Hopkins, Christine Hopkins Date: 05/12/2020 Gender: Female D.O.B: 1955-07-13 Age (years): 38 Referring Provider: Laurance Flatten MD Height (inches): 64 Interpreting Physician: Armanda Magic MD, ABSM Weight (lbs): 208 RPSGT: [place] BMI: 36 MRN: [id number] Neck Size: 16.00  CLINICAL INFORMATION Sleep Study Type: HST  Indication for sleep study: N/A  Epworth Sleepiness Score: 4  SLEEP STUDY TECHNIQUE A multi-channel overnight portable sleep study was performed. The channels recorded were: nasal airflow, thoracic respiratory movement, and oxygen saturation with a pulse oximetry. Snoring was also monitored.  MEDICATIONS Patient self administered medications include: N/A.  SLEEP ARCHITECTURE Patient was studied for 346 minutes. The sleep efficiency was 100.0 % and the patient was supine for 25.9%. The arousal index was 0.0 per hour.  RESPIRATORY PARAMETERS The overall AHI was 6.8 per hour, with a central apnea index of 0.0 per hour.  The oxygen nadir was 78% during sleep.  CARDIAC DATA Mean heart rate during sleep was 60.3 bpm.  IMPRESSIONS - Mild obstructive sleep apnea occurred during this study (AHI = 6.8/h). - No significant central sleep apnea occurred during this study (CAI = 0.0/h). - Severe oxygen desaturation was noted during this study (Min O2 = 78%). - Patient snored 1.2% during the sleep.   DIAGNOSIS - Obstructive Sleep Apnea (G47.33) - Nocturnal Hypoxemia (G47.36)   RECOMMENDATIONS - Therapeutic CPAP titration to determine optimal pressure required to alleviate sleep disordered breathing. - Oral appliance may be considered. - Avoid alcohol, sedatives and other CNS depressants that may worsen sleep apnea and disrupt normal sleep architecture. - Sleep hygiene should be reviewed to assess factors that may improve sleep quality. - Weight management and regular exercise should be initiated or continued. - Return to Sleep Center to  discuss the results of this study - Patient may benefit from in-lab study  [Electronically signed] 05/12/2020 04:47 PM  Armanda Magic MD, ABSM Diplomate, American Board of Sleep Medicine

## 2020-05-12 NOTE — Progress Notes (Signed)
Order placed for BMET that pt needs prior to CT

## 2020-05-14 ENCOUNTER — Encounter: Payer: Self-pay | Admitting: Registered Nurse

## 2020-05-17 ENCOUNTER — Telehealth: Payer: Self-pay | Admitting: *Deleted

## 2020-05-17 NOTE — Telephone Encounter (Signed)
-----   Message from Quintella Reichert, MD sent at 05/12/2020  4:51 PM EST ----- Patient has minimal OSA.  Nina>>please set up virtual visit with me to discuss

## 2020-05-17 NOTE — Telephone Encounter (Signed)
Informed patient of sleep study results and patient understanding was verbalized. Patient understandshersleep study showed Patient has minimal OSA. please set up virtual visit with me to discuss    

## 2020-05-20 NOTE — Telephone Encounter (Signed)
Virtual appointment scheduled for 05/29/20 at 8:20.

## 2020-05-26 ENCOUNTER — Telehealth (HOSPITAL_COMMUNITY): Payer: Self-pay | Admitting: Emergency Medicine

## 2020-05-26 NOTE — Telephone Encounter (Signed)
Attempted to call patient regarding upcoming cardiac CT appointment. °Left message on voicemail with name and callback number °Bertrum Helmstetter RN Navigator Cardiac Imaging °[facility] °[phone] Office °[phone] Cell ° °

## 2020-05-27 ENCOUNTER — Telehealth: Payer: Self-pay

## 2020-05-27 ENCOUNTER — Ambulatory Visit (HOSPITAL_COMMUNITY)
Admission: RE | Admit: 2020-05-27 | Discharge: 2020-05-27 | Disposition: A | Discharge status: Home / Self Care | Payer: Medicare Other | Source: Ambulatory Visit | Attending: Cardiology | Admitting: Cardiology

## 2020-05-27 ENCOUNTER — Other Ambulatory Visit: Payer: Self-pay | Admitting: Registered Nurse

## 2020-05-27 ENCOUNTER — Other Ambulatory Visit: Payer: Self-pay

## 2020-05-27 ENCOUNTER — Telehealth: Payer: Self-pay | Admitting: Cardiology

## 2020-05-27 ENCOUNTER — Encounter (HOSPITAL_COMMUNITY): Payer: Self-pay

## 2020-05-27 DIAGNOSIS — F4321 Adjustment disorder with depressed mood: Secondary | ICD-10-CM

## 2020-05-27 DIAGNOSIS — R933 Abnormal findings on diagnostic imaging of other parts of digestive tract: Secondary | ICD-10-CM

## 2020-05-27 DIAGNOSIS — E1165 Type 2 diabetes mellitus with hyperglycemia: Secondary | ICD-10-CM

## 2020-05-27 DIAGNOSIS — R072 Precordial pain: Secondary | ICD-10-CM | POA: Insufficient documentation

## 2020-05-27 DIAGNOSIS — F422 Mixed obsessional thoughts and acts: Secondary | ICD-10-CM

## 2020-05-27 DIAGNOSIS — I10 Essential (primary) hypertension: Secondary | ICD-10-CM

## 2020-05-27 DIAGNOSIS — E785 Hyperlipidemia, unspecified: Secondary | ICD-10-CM

## 2020-05-27 MED ORDER — IOHEXOL 350 MG/ML SOLN
80.0000 mL | Freq: Once | INTRAVENOUS | Status: AC | PRN
  Administered 2020-05-27: 80 mL via INTRAVENOUS

## 2020-05-27 MED ORDER — NITROGLYCERIN 0.4 MG SL SUBL
0.8000 mg | SUBLINGUAL_TABLET | Freq: Once | SUBLINGUAL | Status: AC
  Administered 2020-05-27: 0.8 mg via SUBLINGUAL

## 2020-05-27 MED ORDER — SERTRALINE HCL 100 MG PO TABS: 100.0000 mg | ORAL_TABLET | Freq: Every day | ORAL | 3 refills | Status: DC

## 2020-05-27 MED ORDER — NITROGLYCERIN 0.4 MG SL SUBL
SUBLINGUAL_TABLET | SUBLINGUAL | Status: AC
  Filled 2020-05-27: qty 2

## 2020-05-27 MED ORDER — SIMVASTATIN 40 MG PO TABS: 40.0000 mg | ORAL_TABLET | Freq: Every day | ORAL | 2 refills | Status: DC

## 2020-05-27 MED ORDER — LISINOPRIL 20 MG PO TABS: 20.0000 mg | ORAL_TABLET | Freq: Every day | ORAL | 1 refills | Status: DC

## 2020-05-27 MED ORDER — OLANZAPINE 5 MG PO TABS: 5.0000 mg | ORAL_TABLET | Freq: Every day | ORAL | 0 refills | Status: DC

## 2020-05-27 NOTE — Telephone Encounter (Signed)
Received a call from Erskine Squibb at [facility] with unexpected CTA results on patient per radiologist.   Abnormalities as follows: Mass-like thickening of the middle and distal third of the esophagus, concerning for potential esophageal neoplasm. Further evaluation with endoscopy is strongly recommended in the near future to better evaluate these findings.  Will route to orderings provider, Dr. Shari Prows to make aware.

## 2020-05-27 NOTE — Telephone Encounter (Signed)
Called and spoke to the patient about her CT results which showed thickening in the mid to distal esophagus concerning for malignancy. We have placed a referral to GI for further evaluation.  CT also showed minimal plaque in the LAD. Will continue statin therapy.   Patient understands and will follow-up with [place] if she has not heard from GI tomorrow.  Laurance Flatten, MD

## 2020-05-27 NOTE — Telephone Encounter (Signed)
Called patient regarding her CT scan results. Left VM to call clinic back. Will follow-up.  Laurance Flatten, MD

## 2020-05-27 NOTE — Telephone Encounter (Signed)
-----   Message from Meriam Sprague, MD sent at 05/27/2020  2:12 PM EST ----- Can we get he a follow-up with GI ASAP? I will call her with the results of her scan.  Thank you so much!

## 2020-05-27 NOTE — Telephone Encounter (Signed)
Per Dr. Shari Prows, urgent GI consult needed.  Consult order placed. MD to call patient with scan results.

## 2020-05-28 ENCOUNTER — Encounter (INDEPENDENT_AMBULATORY_CARE_PROVIDER_SITE_OTHER): Payer: Self-pay

## 2020-05-29 ENCOUNTER — Telehealth (INDEPENDENT_AMBULATORY_CARE_PROVIDER_SITE_OTHER): Payer: Medicare Other | Admitting: Cardiology

## 2020-05-29 ENCOUNTER — Other Ambulatory Visit: Payer: Self-pay

## 2020-05-29 VITALS — BP 120/70 | HR 64 | Ht 64.0 in | Wt 208.0 lb

## 2020-05-29 DIAGNOSIS — I10 Essential (primary) hypertension: Secondary | ICD-10-CM | POA: Diagnosis not present

## 2020-05-29 DIAGNOSIS — G4733 Obstructive sleep apnea (adult) (pediatric): Secondary | ICD-10-CM

## 2020-05-29 NOTE — Patient Instructions (Signed)
Medication Instructions:  No  changes *If you need a refill on your cardiac medications before your next appointment, please call your pharmacy*   Lab Work NONE If you have labs (blood work) drawn today and your tests are completely normal, you will receive your results only by: Marland Kitchen MyChart Message (if you have MyChart) OR . A paper copy in the mail If you have any lab test that is abnormal or we need to change your treatment, we will call you to review the results.   Testing/Procedures: NONE   Follow-Up: At [facility], you and your health needs are our priority.  As part of our continuing mission to provide you with exceptional heart care, we have created designated Provider Care Teams.  These Care Teams include your primary Cardiologist (physician) and Advanced Practice Providers (APPs -  Physician Assistants and Nurse Practitioners) who all work together to provide you with the care you need, when you need it.  We recommend signing up for the patient portal called "MyChart".  Sign up information is provided on this After Visit Summary.  MyChart is used to connect with patients for Virtual Visits (Telemedicine).  Patients are able to view lab/test results, encounter notes, upcoming appointments, etc.  Non-urgent messages can be sent to your provider as well.   To learn more about what you can do with MyChart, go to [email or web address].    Your next appointment:   3 month(s)  The format for your next appointment:   Virtual Visit   Provider:   Armanda Magic, MD   Other Instructions REFERRAL TO DR FULLER OSA ORAL DEVICE

## 2020-05-29 NOTE — Progress Notes (Signed)
Virtual Visit via Telephone Note   This visit type was conducted due to national recommendations for restrictions regarding the COVID-19 Pandemic (e.g. social distancing) in an effort to limit this patient's exposure and mitigate transmission in our community.  Due to her co-morbid illnesses, this patient is at least at moderate risk for complications without adequate follow up.  This format is felt to be most appropriate for this patient at this time.  The patient did not have access to video technology/had technical difficulties with video requiring transitioning to audio format only (telephone).  All issues noted in this document were discussed and addressed.  No physical exam could be performed with this format.  Please refer to the patient's chart for her  consent to telehealth for [facility].    Date:  05/29/2020   ID:  Christine Hopkins, DOB 1956/03/01, MRN [id number] The patient was identified using 2 identifiers.  Patient Location: Home Provider Location: Home Office  PCP:  Patient, No Pcp Per  Cardiologist:  Meriam Sprague, MD  Electrophysiologist:  None   Evaluation Performed:  Follow-Up Visit  Chief Complaint:  OSA  History of Present Illness:     Christine Hopkins is a 64 y.o. female with a hx of Depression, anxiety, DM, GERD, HTN and HLD who was referred by Dr. Shari Prows for evaluation of sleep apnea.  She recently complained of problems with daytime sleepiness as well as snoring and underwent home sleep study showing very mild OSA with an AHI of 6.8/hr and O2 sats as low as 78%.  She is now here for discussion of results.  She says that she snores but has never awakened herself snoring or gasping for breath.  She feels rested in the am and does not have any significant daytime sleepiness.  She has no hx of witnessed apneas. She has no problems with morning HAs.  In reviewing her study she spent 12% of the time with O2 sats < 89%.   The patient does not have symptoms  concerning for COVID-19 infection (fever, chills, cough, or new shortness of breath).    Past Medical History:  Diagnosis Date  . Anxiety   . Arthritis   . Depression   . Diabetes mellitus without complication (HCC)   . GERD (gastroesophageal reflux disease)   . History of back surgery   . Hyperlipidemia   . Hypertension   . Neuromuscular disorder (HCC)   . OCD (obsessive compulsive disorder) 04/05/2019   Past Surgical History:  Procedure Laterality Date  . FRACTURE SURGERY    . JOINT REPLACEMENT    . SPINE SURGERY       Current Meds  Medication Sig  . allopurinol (ZYLOPRIM) 300 MG tablet Take 1 tablet (300 mg total) by mouth daily.  Marland Kitchen ibuprofen (ADVIL,MOTRIN) 800 MG tablet Take 1 tablet (800 mg total) by mouth 3 (three) times daily.  . indomethacin (INDOCIN) 50 MG capsule Take 1 capsule (50 mg total) by mouth 3 (three) times daily as needed.  . lansoprazole (PREVACID) 30 MG capsule Take 1 capsule (30 mg total) by mouth daily at 12 noon.  Marland Kitchen lisinopril (ZESTRIL) 20 MG tablet Take 1 tablet (20 mg total) by mouth daily.  . meloxicam (MOBIC) 7.5 MG tablet Take 1 tablet (7.5 mg total) by mouth daily. (Patient taking differently: Take 7.5 mg by mouth as needed.)  . metFORMIN (GLUCOPHAGE) 1000 MG tablet Take 1 tablet (1,000 mg total) by mouth 2 (two) times daily with a meal.  . metoprolol  tartrate (LOPRESSOR) 100 MG tablet Take 1 tablet (100 mg total) two hours prior to CT scan.  Marland Kitchen OLANZapine (ZYPREXA) 5 MG tablet Take 1 tablet (5 mg total) by mouth at bedtime.  . ondansetron (ZOFRAN) 8 MG tablet Take 1 tablet (8 mg total) by mouth every 8 (eight) hours as needed for nausea or vomiting.  Marland Kitchen oxyCODONE-acetaminophen (PERCOCET/ROXICET) 5-325 MG tablet Take 1 tablet by mouth every 8 (eight) hours as needed for severe pain.  Marland Kitchen sertraline (ZOLOFT) 100 MG tablet Take 1 tablet (100 mg total) by mouth daily.  . simvastatin (ZOCOR) 40 MG tablet Take 1 tablet (40 mg total) by mouth daily.      Allergies:   Prozac [fluoxetine hcl]   Social History   Tobacco Use  . Smoking status: Never Smoker  . Smokeless tobacco: Never Used  Vaping Use  . Vaping Use: Never used  Substance Use Topics  . Alcohol use: No  . Drug use: No     Family Hx: The patient's family history includes Depression in her brother, father, and sister; Diabetes in her brother, brother, and maternal grandmother; Heart disease in her brother, maternal grandfather, and paternal grandmother; Hyperlipidemia in her brother; Mental illness in her brother and sister; Schizophrenia in her brother and sister.  ROS:   Please see the history of present illness.     All other systems reviewed and are negative.   Prior CV studies:   The following studies were reviewed today:  Home sleep study  Labs/Other Tests and Data Reviewed:    EKG:  No ECG reviewed.  Recent Labs: 07/03/2019: TSH 1.820 01/28/2020: ALT 13; BNP 8.2; Hemoglobin 12.9; Platelets 315 05/12/2020: BUN 13; Creatinine, Ser 0.76; Potassium 4.3; Sodium 145   Recent Lipid Panel Lab Results  Component Value Date/Time   CHOL 199 01/28/2020 04:11 PM   TRIG 151 (H) 01/28/2020 04:11 PM   HDL 68 01/28/2020 04:11 PM   CHOLHDL 2.9 01/28/2020 04:11 PM   LDLCALC 105 (H) 01/28/2020 04:11 PM    Wt Readings from Last 3 Encounters:  05/29/20 208 lb (94.3 kg)  05/11/20 208 lb (94.3 kg)  04/14/20 208 lb (94.3 kg)     Risk Assessment/Calculations:    None  Objective:    Vital Signs:  BP 120/70   Pulse 64   Ht 5\' 4"  (1.626 m)   Wt 208 lb (94.3 kg)   BMI 35.70 kg/m    ASSESSMENT & PLAN:    1. OSA -her sleep study was reviewed and showed mild OSA with an AHI of 6.8/hr but with significant nocturnal hypoxemia with O2 sats L 89% for over 12% of the night -I discussed my concerns regarding the nocturnal hypoxemia and we reviewed the detrimental effects it can have on the cardiovascular system -I have recommend a trial of CPAP therapy but the patient  is adamant that she does not want to proceed with PAP therapy -I will refer her to Dr. for evaluation of oral device -would recommend overnight pulse ox once oral device placed -I will see her back in 3 months  2.  HTN -BP controlled at home -continue Lisinopril 20mg  daily dailyi   Shared Decision Making/Informed Consent        COVID-19 Education: The signs and symptoms of COVID-19 were discussed with the patient and how to seek care for testing (follow up with PCP or arrange E-visit).  The importance of social distancing was discussed today.  Time:   Today, I have spent  15 minutes with the patient with telehealth technology discussing the above problems.     Medication Adjustments/Labs and Tests Ordered: Current medicines are reviewed at length with the patient today.  Concerns regarding medicines are outlined above.   Tests Ordered: No orders of the defined types were placed in this encounter.   Medication Changes: No orders of the defined types were placed in this encounter.   Follow Up:  Virtual Visit  in 3 month(s)  Signed, Armanda Magic, MD  05/29/2020 8:11 AM    [facility]

## 2020-06-01 ENCOUNTER — Telehealth: Payer: Self-pay | Admitting: *Deleted

## 2020-06-01 ENCOUNTER — Other Ambulatory Visit (INDEPENDENT_AMBULATORY_CARE_PROVIDER_SITE_OTHER): Payer: Medicare Other

## 2020-06-01 ENCOUNTER — Encounter: Payer: Self-pay | Admitting: Gastroenterology

## 2020-06-01 ENCOUNTER — Ambulatory Visit (INDEPENDENT_AMBULATORY_CARE_PROVIDER_SITE_OTHER): Payer: Medicare Other | Admitting: Gastroenterology

## 2020-06-01 VITALS — BP 110/76 | HR 72 | Ht 63.0 in | Wt 212.4 lb

## 2020-06-01 DIAGNOSIS — R1310 Dysphagia, unspecified: Secondary | ICD-10-CM

## 2020-06-01 DIAGNOSIS — R1110 Vomiting, unspecified: Secondary | ICD-10-CM | POA: Diagnosis not present

## 2020-06-01 LAB — CBC
HCT: 37.9 % (ref 36.0–46.0)
Hemoglobin: 12.6 g/dL (ref 12.0–15.0)
MCHC: 33.1 g/dL (ref 30.0–36.0)
MCV: 84.4 fl (ref 78.0–100.0)
Platelets: 289 10*3/uL (ref 150.0–400.0)
RBC: 4.49 Mil/uL (ref 3.87–5.11)
RDW: 15.6 % — ABNORMAL HIGH (ref 11.5–15.5)
WBC: 8.7 10*3/uL (ref 4.0–10.5)

## 2020-06-01 LAB — COMPREHENSIVE METABOLIC PANEL
ALT: 8 U/L (ref 0–35)
AST: 13 U/L (ref 0–37)
Albumin: 4.3 g/dL (ref 3.5–5.2)
Alkaline Phosphatase: 73 U/L (ref 39–117)
BUN: 15 mg/dL (ref 6–23)
CO2: 30 mEq/L (ref 19–32)
Calcium: 9.3 mg/dL (ref 8.4–10.5)
Chloride: 105 mEq/L (ref 96–112)
Creatinine, Ser: 0.77 mg/dL (ref 0.40–1.20)
GFR: 81.28 mL/min (ref >60.00)
Glucose, Bld: 85 mg/dL (ref 70–99)
Potassium: 4.2 mEq/L (ref 3.5–5.1)
Sodium: 143 mEq/L (ref 135–145)
Total Bilirubin: 0.4 mg/dL (ref 0.2–1.2)
Total Protein: 7.2 g/dL (ref 6.0–8.3)

## 2020-06-01 NOTE — Telephone Encounter (Signed)
Referral made to Dr Irene Limbo with all paperwork sent.

## 2020-06-01 NOTE — Telephone Encounter (Signed)
-----   Message from Alois Cliche, LPN sent at [id number]  8:32 AM EST ----- PT NEEDS REFERRAL TO DR Toni Arthurs FOR OSA AND ORAL DEVICE  THANKS CHRISTINE

## 2020-06-01 NOTE — Progress Notes (Signed)
HPI: This is a   very pleasant 64 year old woman who was referred to me by Meriam Sprague, MD  to evaluate vomiting, dysphagia, weight loss.    I am meeting her for the first time today.  She has had issues with vomiting and dysphagia for at least 10 years now.  She tells me while she lived in [place] she underwent a lot of testing in 2017.  This included what sounds like an upper endoscopy and also esophageal manometry.  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  She declined any offer treatments for it.  This past June she was back in [place] visiting family and had chest pains.  This letter to go to the emergency room.  She tells me she had a CT scan then.  I am able to review the results in care everywhere of a CT scan angio chest PE protocol.  This describes a "markedly dilated thick-walled esophagus"  She saw a cardiologist here in [place] who arranged cardiac CT for atypical chest discomforts.  See that results summarized below.  She has chronic dysphagia to liquids, solids.  She will vomit every day.  She tells me she keeps a jog at her bedside and beside her couch and she estimates that she vomits about one half of everything she tries to eat.  She has been intentionally trying to lose weight and has been successful by about 10 pounds over the last 6 months.   She does not get pyrosis.   Old Data Reviewed: CT scan coronary morphology with CTA 05/27/2020.  Soft tissue findings to suggest "masslike thickening in the middle and distal third of esophagus, concerning for potential esophageal neoplasm."  Coronary artery calcium score 1.45 agonist on units.    Review of systems: Pertinent positive and negative review of systems were noted in the above HPI section. All other review negative.   Past Medical History:  Diagnosis Date  . Anxiety   . Arthritis   . Depression   . Diabetes mellitus without complication (HCC)   . GERD (gastroesophageal reflux disease)   .  History of back surgery   . Hyperlipidemia   . Hypertension   . Neuromuscular disorder (HCC)   . OCD (obsessive compulsive disorder) 04/05/2019    Past Surgical History:  Procedure Laterality Date  . FRACTURE SURGERY    . JOINT REPLACEMENT    . SPINE SURGERY      Current Outpatient Medications  Medication Sig Dispense Refill  . allopurinol (ZYLOPRIM) 300 MG tablet Take 1 tablet (300 mg total) by mouth daily. 90 tablet 1  . colchicine 0.6 MG tablet Take 1 tablet bid for the 5 days for gout attack (Patient not taking: No sig reported) 10 tablet 0  . ibuprofen (ADVIL,MOTRIN) 800 MG tablet Take 1 tablet (800 mg total) by mouth 3 (three) times daily. 90 tablet 0  . indomethacin (INDOCIN) 50 MG capsule Take 1 capsule (50 mg total) by mouth 3 (three) times daily as needed. 90 capsule 1  . lansoprazole (PREVACID) 30 MG capsule Take 1 capsule (30 mg total) by mouth daily at 12 noon. 30 capsule 11  . lisinopril (ZESTRIL) 20 MG tablet Take 1 tablet (20 mg total) by mouth daily. 90 tablet 1  . meloxicam (MOBIC) 7.5 MG tablet Take 1 tablet (7.5 mg total) by mouth daily. (Patient taking differently: Take 7.5 mg by mouth as needed.) 30 tablet 0  . metFORMIN (GLUCOPHAGE) 1000 MG tablet Take 1 tablet (1,000 mg  total) by mouth 2 (two) times daily with a meal. 180 tablet 3  . metoprolol tartrate (LOPRESSOR) 100 MG tablet Take 1 tablet (100 mg total) two hours prior to CT scan. 1 tablet 0  . OLANZapine (ZYPREXA) 5 MG tablet Take 1 tablet (5 mg total) by mouth at bedtime. 30 tablet 3  . OLANZapine (ZYPREXA) 5 MG tablet Take 1 tablet (5 mg total) by mouth at bedtime. 90 tablet 1  . OLANZapine (ZYPREXA) 5 MG tablet Take 1 tablet (5 mg total) by mouth at bedtime. 90 tablet 0  . ondansetron (ZOFRAN) 8 MG tablet Take 1 tablet (8 mg total) by mouth every 8 (eight) hours as needed for nausea or vomiting. 20 tablet 0  . oxyCODONE-acetaminophen (PERCOCET/ROXICET) 5-325 MG tablet Take 1 tablet by mouth every 8 (eight)  hours as needed for severe pain. 15 tablet 0  . sertraline (ZOLOFT) 100 MG tablet Take 1 tablet (100 mg total) by mouth daily. 90 tablet 3  . simvastatin (ZOCOR) 40 MG tablet Take 1 tablet (40 mg total) by mouth daily. 90 tablet 2   No current facility-administered medications for this visit.    Allergies as of 06/01/2020 - Review Complete 06/01/2020  Allergen Reaction Noted  . Prozac [fluoxetine hcl] Hives 08/02/2016    Family History  Problem Relation Age of Onset  . Depression Father   . Mental illness Sister   . Depression Sister   . Schizophrenia Sister   . Diabetes Brother   . Hyperlipidemia Brother   . Mental illness Brother   . Depression Brother   . Schizophrenia Brother   . Heart disease Brother   . Diabetes Brother   . Diabetes Maternal Grandmother   . Heart disease Maternal Grandfather   . Heart disease Paternal Grandmother     Social History   Socioeconomic History  . Marital status: Single    Spouse name: Not on file  . Number of children: Not on file  . Years of education: Not on file  . Highest education level: Not on file  Occupational History  . Not on file  Tobacco Use  . Smoking status: Never Smoker  . Smokeless tobacco: Never Used  Vaping Use  . Vaping Use: Never used  Substance and Sexual Activity  . Alcohol use: No  . Drug use: No  . Sexual activity: Not Currently  Other Topics Concern  . Not on file  Social History Narrative  . Not on file   Social Determinants of Health   Financial Resource Strain: Not on file  Food Insecurity: Not on file  Transportation Needs: Not on file  Physical Activity: Not on file  Stress: Not on file  Social Connections: Not on file  Intimate Partner Violence: Not on file     Physical Exam: Ht 5\' 3"  (1.6 m) Comment: height measured without shoes  Wt 212 lb 6 oz (96.3 kg)   BMI 37.62 kg/m  Constitutional: generally well-appearing Psychiatric: alert and oriented x3 Eyes: extraocular movements  intact Mouth: oral pharynx moist, no lesions Neck: supple no lymphadenopathy Cardiovascular: heart regular rate and rhythm Lungs: clear to auscultation bilaterally Abdomen: soft, nontender, nondistended, no obvious ascites, no peritoneal signs, normal bowel sounds Extremities: no lower extremity edema bilaterally Skin: no lesions on visible extremities   Assessment and plan: 64 y.o. female with chronic dysphagia, vomiting, abnormal esophagus on recent imaging  First I think we need to exclude neoplasm of the esophagus with EGD.  Overall I think it is  unlikely that she has a cancer in her esophagus but the recent CT reading by radiology on cardiac imaging last week was pretty striking.  My first available outpatient endoscopy appointment is not for 3 to 4 weeks and so I am arranging for her to have her procedure done with one of my partners in 2 days.  Second I think more likely she has significant motility disturbance of her esophagus, perhaps achalasia or nutcracker type esophagus.  We are going to try to get all of her records from [place] gastrointestinal work-up 2017 for review here.  She will get a basic set of labs including CBC, complete metabolic profile here as well.  Pending her upcoming upper endoscopy evaluation she will likely need further testing, possibly repeat esophageal manometry, possibly other.  Please see the "Patient Instructions" section for addition details about the plan.   Rob Bunting, MD [facility] 06/01/2020, 10:13 AM  Cc: Meriam Sprague, MD  Total time on date of encounter was 45  minutes (this included time spent preparing to see the patient reviewing records; obtaining and/or reviewing separately obtained history; performing a medically appropriate exam and/or evaluation; counseling and educating the patient and family if present; ordering medications, tests or procedures if applicable; and documenting clinical information in the  health record).

## 2020-06-01 NOTE — Telephone Encounter (Signed)
Informed patient of sleep study results and patient understanding was verbalized. Patient understandshersleep study showed Patient has minimal OSA. please set up virtual visit with me to discuss

## 2020-06-01 NOTE — Patient Instructions (Signed)
If you are age 64 or younger, your body mass index should be between 19-25. Your Body mass index is 37.62 kg/m. If this is out of the aformentioned range listed, please consider follow up with your Primary Care Provider.   You have been scheduled for an endoscopy. Please follow written instructions given to you at your visit today. If you use inhalers (even only as needed), please bring them with you on the day of your procedure.  Due to recent changes in healthcare laws, you may see the results of your imaging and laboratory studies on MyChart before your provider has had a chance to review them.  We understand that in some cases there may be results that are confusing or concerning to you. Not all laboratory results come back in the same time frame and the provider may be waiting for multiple results in order to interpret others.  Please give [place] 48 hours in order for your provider to thoroughly review all the results before contacting the office for clarification of your results.   Thank you for entrusting me with your care and choosing [facility].  Dr Christella Hartigan

## 2020-06-03 ENCOUNTER — Ambulatory Visit (AMBULATORY_SURGERY_CENTER): Payer: Medicare Other | Admitting: Internal Medicine

## 2020-06-03 ENCOUNTER — Encounter: Payer: Self-pay | Admitting: Internal Medicine

## 2020-06-03 ENCOUNTER — Other Ambulatory Visit: Payer: Self-pay

## 2020-06-03 VITALS — BP 128/74 | HR 56 | Temp 97.1°F | Resp 11 | Ht 63.0 in | Wt 212.0 lb

## 2020-06-03 DIAGNOSIS — R1110 Vomiting, unspecified: Secondary | ICD-10-CM

## 2020-06-03 DIAGNOSIS — K224 Dyskinesia of esophagus: Secondary | ICD-10-CM

## 2020-06-03 DIAGNOSIS — R1310 Dysphagia, unspecified: Secondary | ICD-10-CM | POA: Diagnosis not present

## 2020-06-03 MED ORDER — SODIUM CHLORIDE 0.9 % IV SOLN: 500.0000 mL | INTRAVENOUS | Status: DC

## 2020-06-03 NOTE — Progress Notes (Signed)
Reviewed medical/surgical history with patient and noted any changes 

## 2020-06-03 NOTE — Op Note (Signed)
[facility] Patient Name: Christine Hopkins Procedure Date: 06/03/2020 10:38 AM MRN: [id number] Endoscopist: Iva Boop , MD Age: 64 Referring MD:  Date of Birth: Mar 24, 1956 Gender: Female Account #: [id number] Procedure:                Upper GI endoscopy Indications:              Dysphagia, Abnormal CT of the GI tract Medicines:                Propofol per Anesthesia, Monitored Anesthesia Care Procedure:                Pre-Anesthesia Assessment:                           - Prior to the procedure, a History and Physical                            was performed, and patient medications and                            allergies were reviewed. The patient's tolerance of                            previous anesthesia was also reviewed. The risks                            and benefits of the procedure and the sedation                            options and risks were discussed with the patient.                            All questions were answered, and informed consent                            was obtained. Prior Anticoagulants: The patient has                            taken no previous anticoagulant or antiplatelet                            agents. ASA Grade Assessment: II - A patient with                            mild systemic disease. After reviewing the risks                            and benefits, the patient was deemed in                            satisfactory condition to undergo the procedure.                           After obtaining informed consent, the endoscope was  passed under direct vision. Throughout the                            procedure, the patient's blood pressure, pulse, and                            oxygen saturations were monitored continuously. The                            Endoscope was introduced through the mouth, and                            advanced to the second part of duodenum. The upper                             GI endoscopy was accomplished without difficulty.                            The patient tolerated the procedure well. Scope In: Scope Out: Findings:                 The examined esophagus was moderately tortuous.                           The lumen of the proximal esophagus and mid                            esophagus was mildly dilated.                           Abnormal motility was noted in the esophagus. There                            is a decrease in motility of the esophageal body.                            The distal esophagus/lower esophageal sphincter is                            spastic, but gives up passage to the endoscope.                           The entire examined stomach was normal.                           The cardia and gastric fundus were normal on                            retroflexion.                           The examined duodenum was normal.                           Islands of salmon-colored mucosa were present.  Inlet Patch proximal esophagus                           The exam was otherwise without abnormality. Complications:            No immediate complications. Estimated Blood Loss:     Estimated blood loss: none. Estimated blood loss:                            none. Impression:               Slight food residue seen (minimal)                           - Tortuous esophagus.                           - Dilation in the proximal esophagus and in the mid                            esophagus.                           - Abnormal esophageal motility, suspicious for                            achalasia.                           - Normal stomach.                           - Normal examined duodenum.                           - Salmon-colored mucosa. Proximal esophagus 2                            islands = inlet patch(es)                           - The examination was otherwise normal. NO                             ESOPHAGEAL TUMOR AS SUGGESTED BY CT                           - No specimens collected. Recommendation:           - Patient has a contact number available for                            emergencies. The signs and symptoms of potential                            delayed complications were discussed with the                            patient. Return  to normal activities tomorrow.                            Written discharge instructions were provided to the                            patient.                           - Resume previous diet.                           - Continue present medications.                           - DR JACOBS IS OBTAINING OLD RECORDS OF PRIOR                            MOTILITRY W/U DONE IN [place] AND WILL CONTACT                            PATIENT AFTER REVIEW Iva Booparl E Janequa Kipnis, MD 06/03/2020 11:04:07 AM This report has been signed electronically.

## 2020-06-03 NOTE — Progress Notes (Signed)
A/ox3, pleased with MAC, report to RN 

## 2020-06-03 NOTE — Patient Instructions (Addendum)
You do not have an esophageal cancer fortunately.  You do have a problem where the esophagus does not function properly based upon all of the information we have.  Dr. Christella Hartigan is trying to get the records from [place] to see what the testing showed.  I do think we can help you with this so you can swallow better.  I will communicate these results with Dr. Christella Hartigan and he will be in touch with you.  I appreciate the opportunity to care for you.  Iva Boop, MD, FACG  YOU HAD AN ENDOSCOPIC PROCEDURE TODAY AT THE [facility]:   Refer to the procedure report that was given to you for any specific questions about what was found during the examination.  If the procedure report does not answer your questions, please call your gastroenterologist to clarify.  If you requested that your care partner not be given the details of your procedure findings, then the procedure report has been included in a sealed envelope for you to review at your convenience later.  YOU SHOULD EXPECT: Some feelings of bloating in the abdomen. Passage of more gas than usual.  Walking can help get rid of the air that was put into your GI tract during the procedure and reduce the bloating.   Please Note:  You might notice some irritation and congestion in your nose or some drainage.  This is from the oxygen used during your procedure.  There is no need for concern and it should clear up in a day or so.  SYMPTOMS TO REPORT IMMEDIATELY:     Following upper endoscopy (EGD)  Vomiting of blood or coffee ground material  New chest pain or pain under the shoulder blades  Painful or persistently difficult swallowing  New shortness of breath  Fever of 100F or higher  Black, tarry-looking stools  For urgent or emergent issues, a gastroenterologist can be reached at any hour by calling [phone]. Do not use MyChart messaging for urgent concerns.    DIET:  We do recommend a small meal at first, but then you  may proceed to your regular diet.  Drink plenty of fluids but you should avoid alcoholic beverages for 24 hours.  ACTIVITY:  You should plan to take it easy for the rest of today and you should NOT DRIVE or use heavy machinery until tomorrow (because of the sedation medicines used during the test).    FOLLOW UP: Our staff will call the number listed on your records 48-72 hours following your procedure to check on you and address any questions or concerns that you may have regarding the information given to you following your procedure. If we do not reach you, we will leave a message.  We will attempt to reach you two times.  During this call, we will ask if you have developed any symptoms of COVID 19. If you develop any symptoms (ie: fever, flu-like symptoms, shortness of breath, cough etc.) before then, please call [phone].  If you test positive for Covid 19 in the 2 weeks post procedure, please call and report this information to [place].     SIGNATURES/CONFIDENTIALITY: You and/or your care partner have signed paperwork which will be entered into your electronic medical record.  These signatures attest to the fact that that the information above on your After Visit Summary has been reviewed and is understood.  Full responsibility of the confidentiality of this discharge information lies with you and/or your care-partner.

## 2020-06-04 ENCOUNTER — Telehealth: Payer: Self-pay

## 2020-06-04 NOTE — Telephone Encounter (Signed)
-----   Message from Rachael Fee, MD sent at 06/04/2020 11:33 AM EST ----- Regarding: RE: Halford Decamp, thanks for doing the EGD!  Albert Devaul, Can you contact her. The EGD showed no sign of any tumors, cancers or masses in her esophagus.  She needs hi resolution esophageal manometry to continue the workup for her chronic dysphagia.  A lot may have changed since her [place] testing several years ago.    Thanks   ----- Message ----- From: Iva Boop, MD Sent: 06/03/2020  11:04 AM EST To: Rachael Fee, MD Subject: egd                                            Looks like could be achalasia

## 2020-06-05 ENCOUNTER — Telehealth: Payer: Self-pay | Admitting: Gastroenterology

## 2020-06-05 ENCOUNTER — Telehealth: Payer: Self-pay

## 2020-06-05 ENCOUNTER — Other Ambulatory Visit: Payer: Self-pay

## 2020-06-05 ENCOUNTER — Telehealth: Payer: Self-pay | Admitting: *Deleted

## 2020-06-05 DIAGNOSIS — R1310 Dysphagia, unspecified: Secondary | ICD-10-CM

## 2020-06-05 NOTE — Telephone Encounter (Signed)
No answer for post procedure call back. Unable to leave message. 

## 2020-06-05 NOTE — Telephone Encounter (Signed)
Manometry has been scheduled for 07/03/20 at 830 am at [facility].    The patient has been notified of this information and all questions answered.

## 2020-06-05 NOTE — Telephone Encounter (Signed)
   I reviewed a packet of information from [place].  January 2017 double contrast examination of the esophagus.  Indication achalasia.  Findings the esophagus is generally dilated, reaching a maximum diameter of 4.2 cm.  There is distal tortuosity.  There is near complete obstruction of the distal esophagus with only a trace of barium trickling into the stomach during the examination."  EGD January 2017, indication dysphagia.  Findings dilation of the esophagus.  GE junction was tight but no stricture.

## 2020-06-05 NOTE — Telephone Encounter (Signed)
Left message on answering machine. 

## 2020-06-30 ENCOUNTER — Telehealth: Payer: Self-pay | Admitting: Gastroenterology

## 2020-06-30 ENCOUNTER — Other Ambulatory Visit (HOSPITAL_COMMUNITY): Payer: Medicare Other

## 2020-06-30 NOTE — Telephone Encounter (Signed)
Pls callpt. She needs to r/s manometry that is scheduled for Friday 07/03/20 because she forgot to take the Covid test.

## 2020-06-30 NOTE — Telephone Encounter (Signed)
The pt manometry has been rescheduled for 07/13/20 at 830 am.  COVID test on 07/09/20 at 1015 am.  Message left on the pt voicemail with new appt information.  I will also send to the pt My Chart.

## 2020-07-09 ENCOUNTER — Other Ambulatory Visit (HOSPITAL_COMMUNITY)
Admission: RE | Admit: 2020-07-09 | Discharge: 2020-07-09 | Disposition: A | Discharge status: Home / Self Care | Payer: Medicare Other | Source: Ambulatory Visit | Attending: Gastroenterology | Admitting: Gastroenterology

## 2020-07-09 DIAGNOSIS — Z01812 Encounter for preprocedural laboratory examination: Secondary | ICD-10-CM | POA: Insufficient documentation

## 2020-07-09 DIAGNOSIS — Z20822 Contact with and (suspected) exposure to covid-19: Secondary | ICD-10-CM | POA: Diagnosis not present

## 2020-07-09 LAB — SARS CORONAVIRUS 2 (TAT 6-24 HRS): SARS Coronavirus 2: NEGATIVE

## 2020-07-13 ENCOUNTER — Encounter (HOSPITAL_COMMUNITY): Payer: Self-pay | Admitting: Gastroenterology

## 2020-07-13 ENCOUNTER — Ambulatory Visit (HOSPITAL_COMMUNITY)
Admission: RE | Admit: 2020-07-13 | Discharge: 2020-07-13 | Disposition: A | Discharge status: Home / Self Care | Payer: Medicare Other | Attending: Gastroenterology | Admitting: Gastroenterology

## 2020-07-13 ENCOUNTER — Encounter (HOSPITAL_COMMUNITY)
Admission: RE | Disposition: A | Discharge status: Home / Self Care | Payer: Self-pay | Source: Home / Self Care | Attending: Gastroenterology

## 2020-07-13 DIAGNOSIS — R1310 Dysphagia, unspecified: Secondary | ICD-10-CM | POA: Insufficient documentation

## 2020-07-13 DIAGNOSIS — R1319 Other dysphagia: Secondary | ICD-10-CM | POA: Diagnosis not present

## 2020-07-13 HISTORY — PX: ESOPHAGEAL MANOMETRY: SHX5429

## 2020-07-13 SURGERY — MANOMETRY, ESOPHAGUS
Anesthesia: Choice

## 2020-07-13 MED ORDER — LIDOCAINE VISCOUS HCL 2 % MT SOLN
OROMUCOSAL | Status: AC
  Filled 2020-07-13: qty 15

## 2020-07-13 SURGICAL SUPPLY — 2 items
FACESHIELD LNG OPTICON STERILE (SAFETY) IMPLANT
GLOVE BIO SURGEON STRL SZ8 (GLOVE) ×4 IMPLANT

## 2020-07-13 NOTE — Progress Notes (Signed)
Esophageal manometry performed per protocol without complications.  Patient tolerated well. 

## 2020-07-15 ENCOUNTER — Encounter (HOSPITAL_COMMUNITY): Payer: Self-pay | Admitting: Gastroenterology

## 2020-07-15 DIAGNOSIS — R1319 Other dysphagia: Secondary | ICD-10-CM

## 2020-07-20 ENCOUNTER — Telehealth: Payer: Self-pay | Admitting: Gastroenterology

## 2020-07-20 ENCOUNTER — Other Ambulatory Visit: Payer: Self-pay | Admitting: Registered Nurse

## 2020-07-20 DIAGNOSIS — M109 Gout, unspecified: Secondary | ICD-10-CM

## 2020-07-20 MED ORDER — OMEPRAZOLE 40 MG PO CPDR: 40.0000 mg | DELAYED_RELEASE_CAPSULE | Freq: Every day | ORAL | 3 refills | Status: DC

## 2020-07-20 NOTE — Telephone Encounter (Signed)
Prescription has been sent to the pharmacy as advised.  No appts available for 6 weeks.  The pt will call in a few weeks to make appt.

## 2020-07-20 NOTE — Telephone Encounter (Signed)
I spoke with her just now about her esophageal manometry results.  I would like her to start peppermint oil with two Altoid tabs before every meal.  She knows she can buy this at any grocery store pharmacy.  Also please prescribe her omeprazole 40 mg pills 1 pill shortly before breakfast every meal.  Dispense 30 with 3 refills.  Lastly she needs an office visit with me in about 6 weeks to see how she has responded.

## 2020-07-20 NOTE — Progress Notes (Deleted)
Cardiology Office Note:    Date:  07/20/2020   ID:  Christine Hopkins, DOB 01/02/1956, MRN [id number]  PCP:  Janeece Agee, NP  [facility] Cardiologist:  Meriam Sprague, MD  [facility] Electrophysiologist:  None   Referring MD: No ref. provider found    History of Present Illness:    Christine Hopkins is a 65 y.o. female with a hx of anxiety, depression, DMII, HLD, and HTN who returns to clinic for follow-up.    Last seen in clinic 04/14/20 for chest pain. Fortunately, CTA coronaries with mild non-obstructive disease. TTE with normal BiV function. Notably on CTA, the patient had severely dilated esophagus and was referred to GI for further management.  Past Medical History:  Diagnosis Date  . Allergy   . Anxiety   . Arthritis   . Depression   . Diabetes mellitus without complication (HCC)   . GERD (gastroesophageal reflux disease)   . History of back surgery   . Hyperlipidemia   . Hypertension   . Mild sleep apnea   . Neuromuscular disorder (HCC)   . OCD (obsessive compulsive disorder) 04/05/2019    Past Surgical History:  Procedure Laterality Date  . ESOPHAGEAL MANOMETRY N/A 07/13/2020   Procedure: ESOPHAGEAL MANOMETRY (EM);  Surgeon: Rachael Fee, MD;  Location: [facility];  Service: Endoscopy;  Laterality: N/A;  . LUMBAR DISC SURGERY    . ORIF ANKLE FRACTURE Right    and tibia    Current Medications: No outpatient medications have been marked as taking for the 07/23/20 encounter (Appointment) with Meriam Sprague, MD.     Allergies:   Prozac [fluoxetine hcl]   Social History   Socioeconomic History  . Marital status: Single    Spouse name: Not on file  . Number of children: 1  . Years of education: Not on file  . Highest education level: Not on file  Occupational History  . Occupation: Disabled  Tobacco Use  . Smoking status: Never Smoker  . Smokeless tobacco: Never Used  Vaping Use  . Vaping Use: Never used  Substance and Sexual  Activity  . Alcohol use: No  . Drug use: No  . Sexual activity: Not Currently  Other Topics Concern  . Not on file  Social History Narrative  . Not on file   Social Determinants of Health   Financial Resource Strain: Not on file  Food Insecurity: Not on file  Transportation Needs: Not on file  Physical Activity: Not on file  Stress: Not on file  Social Connections: Not on file     Family History: The patient's ***family history includes Depression in her brother, father, and sister; Diabetes in her brother, brother, and maternal grandmother; Heart disease in her brother, maternal grandfather, and paternal grandmother; Hyperlipidemia in her brother; Mental illness in her brother and sister; Schizophrenia in her brother and sister. There is no history of Colon polyps, Prostate cancer, Rectal cancer, Stomach cancer, or Pancreatic cancer.  ROS:   Please see the history of present illness.    *** All other systems reviewed and are negative.  EKGs/Labs/Other Studies Reviewed:    The following studies were reviewed today: TTE 2020/05/29: 1. Left ventricular ejection fraction, by estimation, is 60 to 65%. The  left ventricle has normal function. The left ventricle has no regional  wall motion abnormalities. Left ventricular diastolic parameters are  consistent with Grade II diastolic  dysfunction (pseudonormalization).  2. Right ventricular systolic function is normal. The right ventricular  size  is normal. There is normal pulmonary artery systolic pressure.  3. Left atrial size was mildly dilated.  4. The mitral valve is normal in structure. Mild mitral valve  regurgitation. No evidence of mitral stenosis.  5. The aortic valve is normal in structure. Aortic valve regurgitation is  not visualized. No aortic stenosis is present.  6. The inferior vena cava is normal in size with greater than 50%  respiratory variability, suggesting right atrial pressure of 3 mmHg.   Coronary CTA  05/27/20: IMPRESSION: 1. Coronary artery calcium score 1.45 Agatston units. This places the patient in the 65th percentile for age and gender, suggesting intermediate risk for future cardiac events.  2.  Mild nonobstructive CAD.  07/26/19 Lower extremity venous ultrasound:  FINDINGS: VENOUS Normal compressibility of the common femoral, superficial femoral, and popliteal veins, as well as the visualized calf veins. Visualized portions of profunda femoral vein and great saphenous vein unremarkable. No filling defects to suggest DVT on grayscale or color Doppler imaging. Doppler waveforms show normal direction of venous flow, normal respiratory phasicity and response to augmentation.  Limited views of the contralateral common femoral vein are unremarkable.  OTHER  None.  Limitations: none  IMPRESSION: No femoropopliteal DVT nor evidence of DVT within the visualized calf veins.  If clinical symptoms are inconsistent or if there are persistent or worsening symptoms, further imaging (possibly involving the iliac veins) may be warranted.  CTA chest 11/2019: FINDINGS:  LOWER NECK AND AXILLA: There is no lymphadenopathy. The thyroid gland appears normal.   HEART AND VESSELS: Heart size appears enlarged. There is no pericardial effusion. Scattered coronary artery calcifications are identified. Scattered aortic calcifications are present. The aorta and great vessels are normal in caliber.  The main pulmonary artery is normal in caliber. There are no central filling defects to suggest central pulmonary embolism.   MEDIASTINUM AND HILA: There is no lymphadenopathy or mass. Benign calcified lymph nodes are present. The esophagus is markedly dilated and thick walled.   LUNG AND AIRWAYS: No focal airspace consolidation is seen. Minimal bibasilar dependent atelectasis is identified. The central airways are patent. There is no bronchiectasis or honeycombing. No  suspicious nodule or mass.   PLEURA: There is no pneumothorax or pleural effusion.   BONES AND CHEST WALL: Soft tissue structures appear grossly normal. Osseous structures appear intact. There is multilevel vertebral body spurring.   UPPER ABDOMEN: Visualized upper abdominal structures appear grossly normal.   IMPRESSION:  1. No evidence of acute cardiopulmonary abnormality. No pulmonary embolus or focal airspace disease.  2. Cardiomegaly.  3. Markedly dilated thick-walled esophagus.   EKG:  EKG is *** ordered today.  The ekg ordered today demonstrates ***  Recent Labs: 01/28/2020: BNP 8.2 06/01/2020: ALT 8; BUN 15; Creatinine, Ser 0.77; Hemoglobin 12.6; Platelets 289.0; Potassium 4.2; Sodium 143  Recent Lipid Panel    Component Value Date/Time   CHOL 199 01/28/2020 1611   TRIG 151 (H) 01/28/2020 1611   HDL 68 01/28/2020 1611   CHOLHDL 2.9 01/28/2020 1611   LDLCALC 105 (H) 01/28/2020 1611     Risk Assessment/Calculations:   {Does this patient have ATRIAL FIBRILLATION?[phone]}   Physical Exam:    VS:  There were no vitals taken for this visit.    Wt Readings from Last 3 Encounters:  06/03/20 212 lb (96.2 kg)  06/01/20 212 lb 6 oz (96.3 kg)  05/29/20 208 lb (94.3 kg)     GEN: *** Well nourished, well developed in no acute distress HEENT: Normal  NECK: No JVD; No carotid bruits LYMPHATICS: No lymphadenopathy CARDIAC: ***RRR, no murmurs, rubs, gallops RESPIRATORY:  Clear to auscultation without rales, wheezing or rhonchi  ABDOMEN: Soft, non-tender, non-distended MUSCULOSKELETAL:  No edema; No deformity  SKIN: Warm and dry NEUROLOGIC:  Alert and oriented x 3 PSYCHIATRIC:  Normal affect   ASSESSMENT:    No diagnosis found. PLAN:    In order of problems listed above:  #Non-obstructive coronary artery disease: #Coronary calcifications: Patient with episode of neck, arm, jaw pain that occurred at rest while visiting [place]. Went to OSH ED where work-up  was reportedly normal. Coronary CTA here with mild LAD disease. TTE with normal BiV function. 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  -Lisinopril 20mg   #Daytime somnolence: #Snoring: -Followed by Dr. -Recommended for CPAP  #HLD Managed by PCP. Last LDL 105. -Continue simvastatin 40mg  daily -Follow-up CTA and adjust accordingly   {Are you ordering a CV Procedure (e.g. stress test, cath, DCCV, TEE, etc)?   Press F2        :  Medication Adjustments/Labs and Tests Ordered: Current medicines are reviewed at length with the patient today.  Concerns regarding medicines are outlined above.  No orders of the defined types were placed in this encounter.  No orders of the defined types were placed in this encounter.   There are no Patient Instructions on file for this visit.   Signed, , MD  07/20/2020 12:35 PM    [facility]

## 2020-07-23 ENCOUNTER — Ambulatory Visit: Payer: Medicare Other | Admitting: Cardiology

## 2020-07-25 IMAGING — US US EXTREM LOW VENOUS*L*
1 series · 14 of 24 positions shown · non-contrast
Comparison: None.

CLINICAL DATA: Left calf pain x3 days, varicose veins, obesity

EXAM:
LEFT LOWER EXTREMITY VENOUS DOPPLER ULTRASOUND
TECHNIQUE: Gray-scale sonography with compression, as well as color and duplex
ultrasound, were performed to evaluate the deep venous system(s)
from the level of the common femoral vein through the popliteal and
proximal calf veins.

[Series 1: us extrem low venous*left* · 14 of 38 slices shown]
[im 1/38]
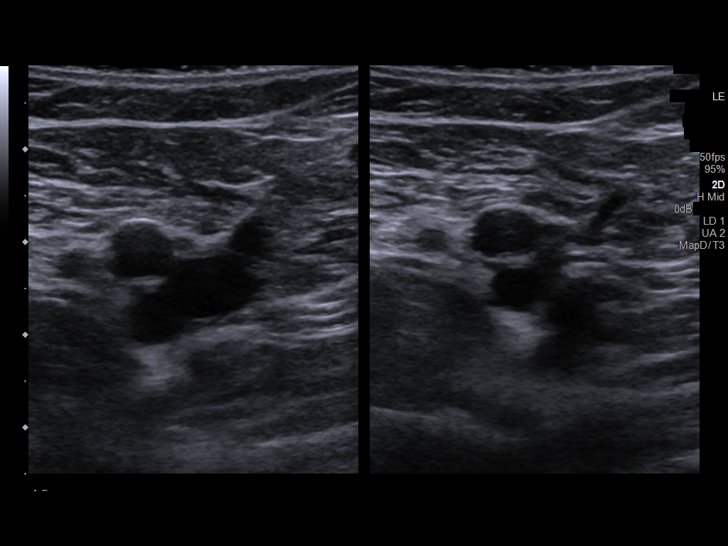
[im 4/38]
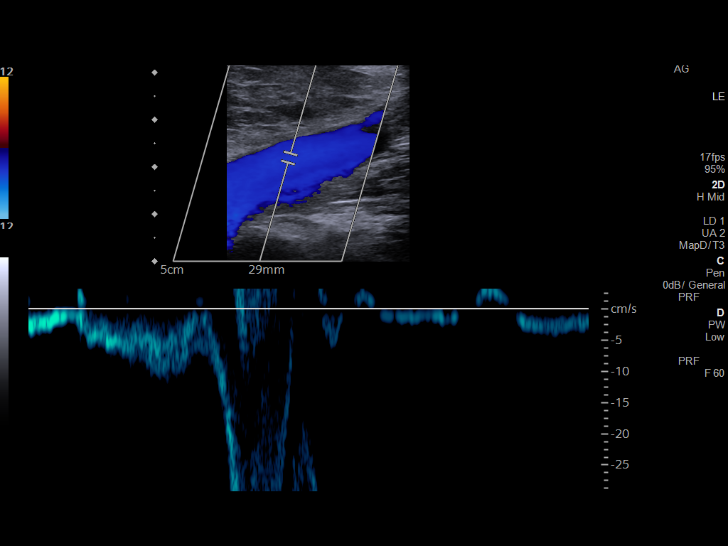
[im 7/38]
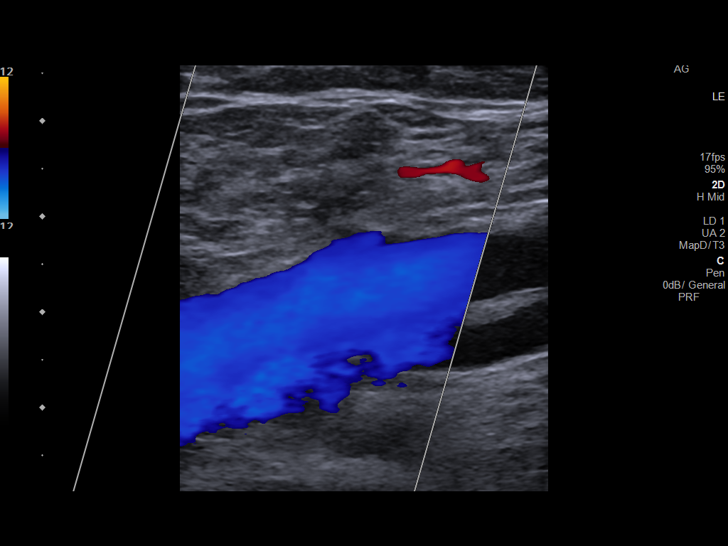
[im 10/38]
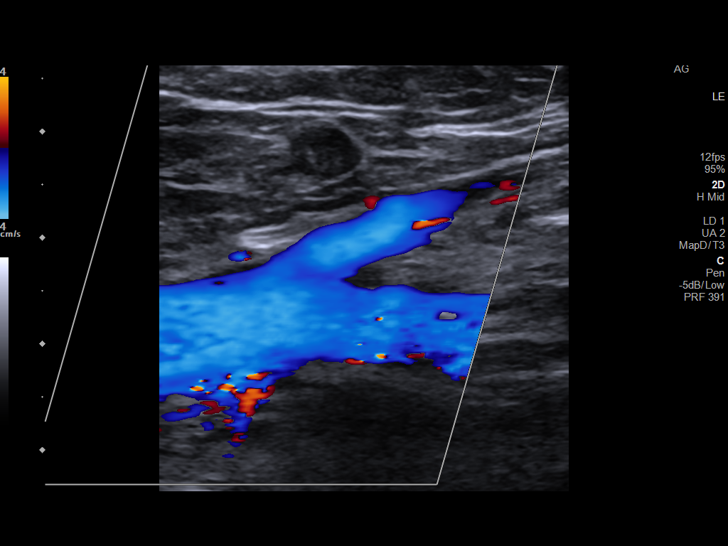
[im 12/38]
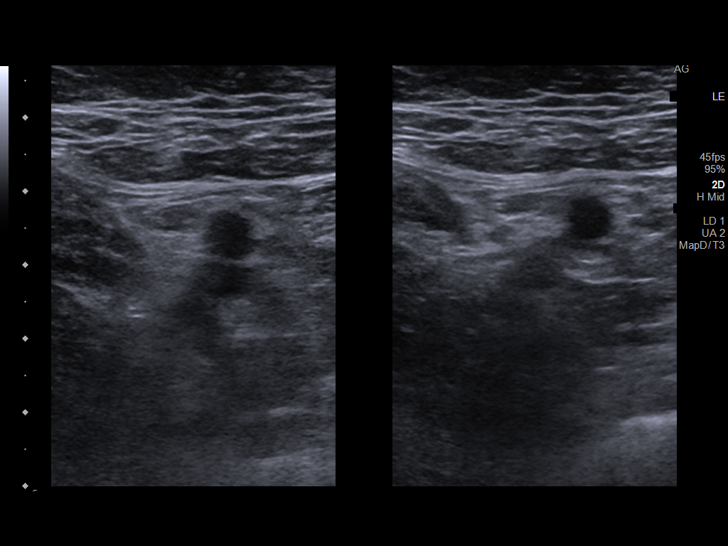
[im 15/38]
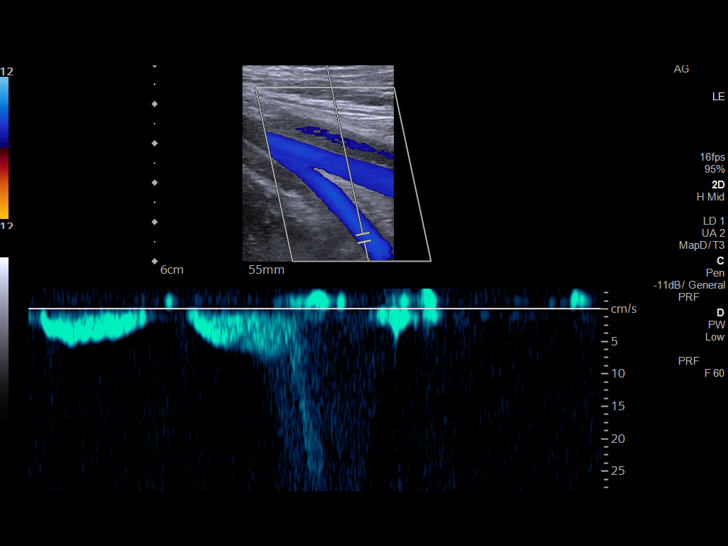
[im 18/38]
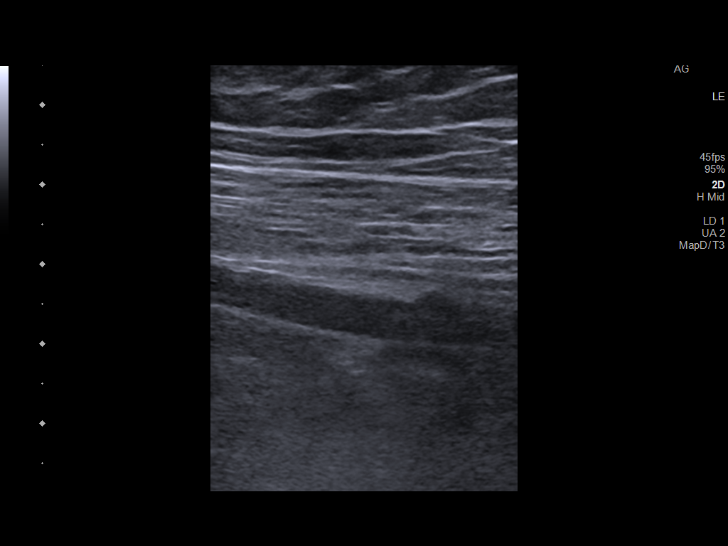
[im 20/38]
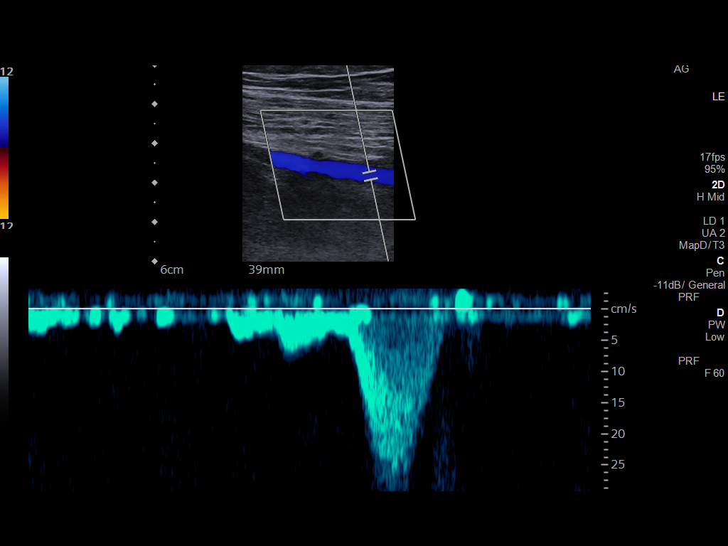
[im 23/38]
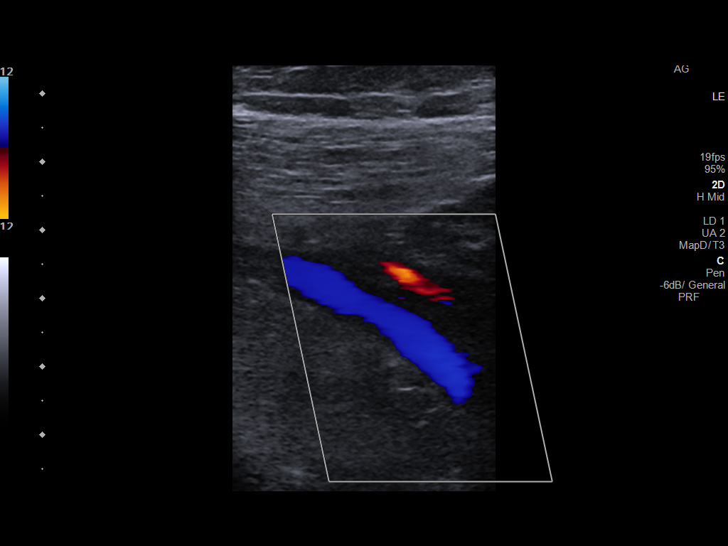
[im 26/38]
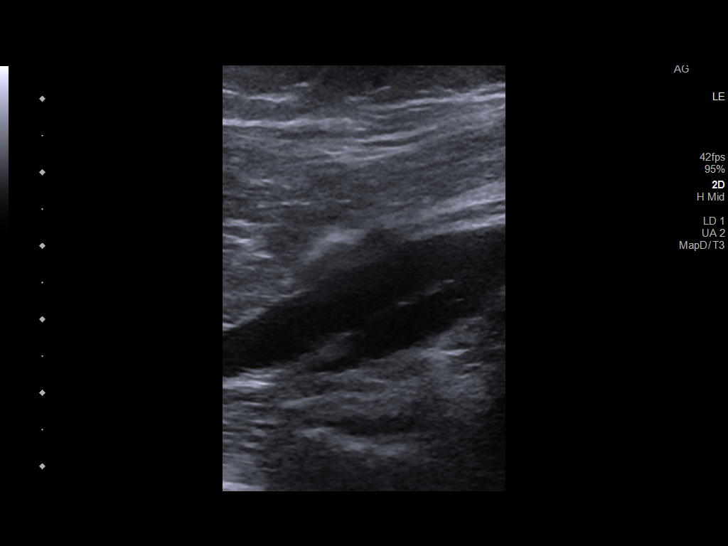
[im 29/38]
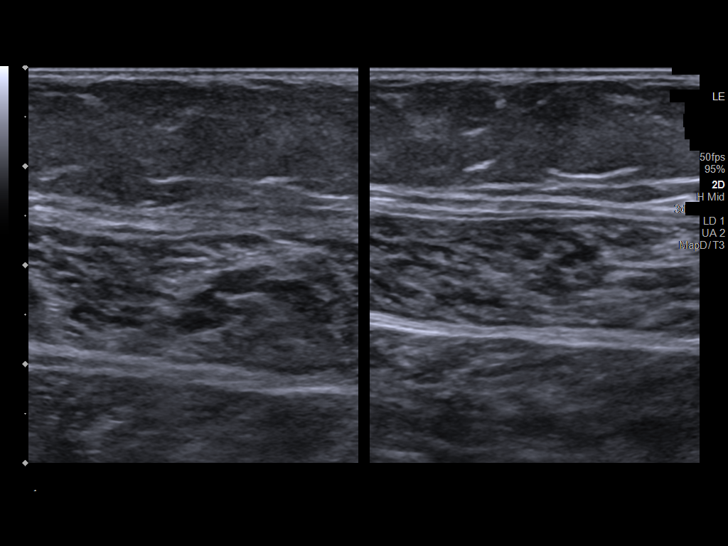
[im 31/38]
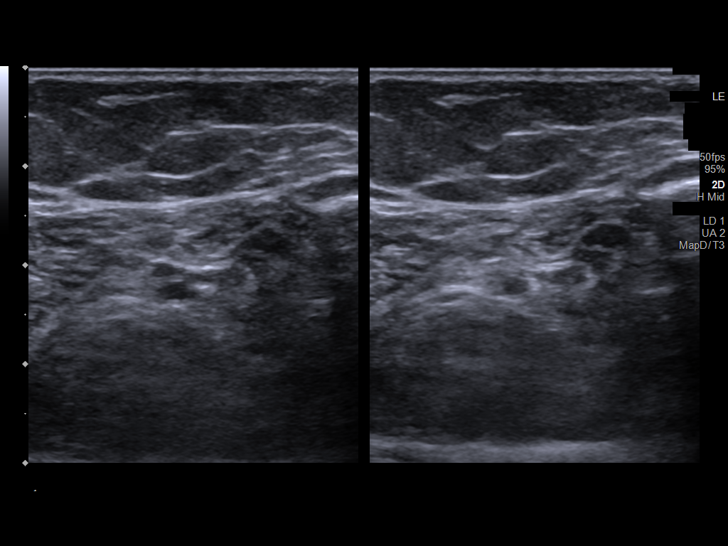
[im 34/38]
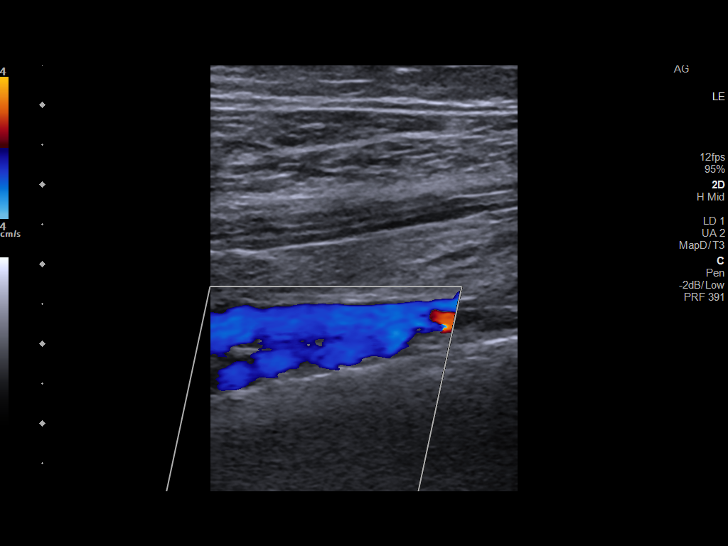
[im 38/38]
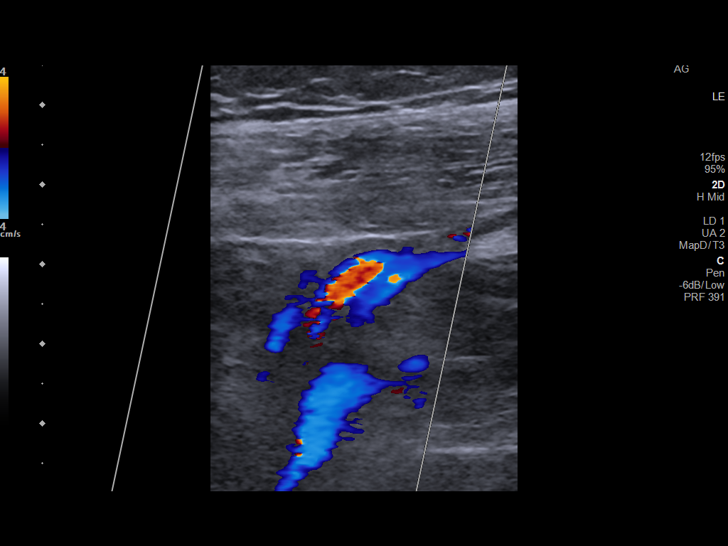

[14 of 24 positions shown; findings below may reference images not displayed]

FINDINGS: VENOUS

Normal compressibility of the common femoral, superficial femoral,
and popliteal veins, as well as the visualized calf veins.
Visualized portions of profunda femoral vein and great saphenous
vein unremarkable. No filling defects to suggest DVT on grayscale or
color Doppler imaging. Doppler waveforms show normal direction of
venous flow, normal respiratory phasicity and response to
augmentation.

Limited views of the contralateral common femoral vein are
unremarkable.

OTHER

None.

Limitations: none
IMPRESSION: No femoropopliteal DVT nor evidence of DVT within the visualized
calf veins.

If clinical symptoms are inconsistent or if there are persistent or
worsening symptoms, further imaging (possibly involving the iliac
veins) may be warranted.

## 2020-08-02 NOTE — Progress Notes (Signed)
Cardiology Office Note:    Date:  08/03/2020   ID:  Christine Patchachel Haris, DOB 05-22-1956, MRN [id number]  PCP:  Janeece AgeeMorrow, Richard, NP   [facility]  Cardiologist:  Meriam SpragueHeather E Azrael Huss, MD  Advanced Practice Provider:  No care team member to display Electrophysiologist:  None   Referring MD: Janeece AgeeMorrow, Richard, NP    History of Present Illness:    Christine Hopkins is a 65 y.o. female with a hx of anxiety, depression, DMII, HLD, and HTN who returns to clinic for follow-up.    Last seen in clinic 04/14/20 for chest pain. Fortunately, CTA coronaries with mild non-obstructive disease. TTE with normal BiV function. Notably on CTA, the patient had severely dilated esophagus and was referred to GI for further management.  The patient states that she has followed up with GI and fortunately esophageal findings were not consistent with cancer. She was diagnosed with esophageal spasm. She was placed on peppermint oil and omeprazole which has helped.   Otherwise, she is doing well. Blood pressure has been well controlled. [facility] She is determined to start losing weight. No chest pain, SOB, LE edema, fevers or chills. Taking all meds as prescribed.   Past Medical History:  Diagnosis Date  . Allergy   . Anxiety   . Arthritis   . Depression   . Diabetes mellitus without complication (HCC)   . GERD (gastroesophageal reflux disease)   . History of back surgery   . Hyperlipidemia   . Hypertension   . Mild sleep apnea   . Neuromuscular disorder (HCC)   . OCD (obsessive compulsive disorder) 04/05/2019    Past Surgical History:  Procedure Laterality Date  . ESOPHAGEAL MANOMETRY N/A 07/13/2020   Procedure: ESOPHAGEAL MANOMETRY (EM);  Surgeon: Rachael FeeJacobs, Daniel P, MD;  Location: [facility];  Service: Endoscopy;  Laterality: N/A;  . LUMBAR DISC SURGERY    . ORIF ANKLE FRACTURE Right    and tibia    Current Medications: Current Meds  Medication  Sig  . aspirin EC 81 MG tablet Take 325 mg by mouth daily. Swallow whole.  . colchicine 0.6 MG tablet Take 1 tablet bid for the 5 days for gout attack (Patient taking differently: as needed. Take as needed for gout attack)  . ibuprofen (ADVIL,MOTRIN) 800 MG tablet Take 1 tablet (800 mg total) by mouth 3 (three) times daily. (Patient taking differently: Take 800 mg by mouth as needed.)  . indomethacin (INDOCIN) 50 MG capsule Take 1 capsule (50 mg total) by mouth 3 (three) times daily as needed.  Marland Kitchen. lisinopril (ZESTRIL) 20 MG tablet Take 1 tablet (20 mg total) by mouth daily.  . meloxicam (MOBIC) 7.5 MG tablet Take 1 tablet (7.5 mg total) by mouth daily. (Patient taking differently: Take 7.5 mg by mouth as needed.)  . metFORMIN (GLUCOPHAGE) 1000 MG tablet Take 1 tablet (1,000 mg total) by mouth 2 (two) times daily with a meal.  . omeprazole (PRILOSEC) 40 MG capsule Take 1 capsule (40 mg total) by mouth daily.  . ondansetron (ZOFRAN) 8 MG tablet Take 1 tablet (8 mg total) by mouth every 8 (eight) hours as needed for nausea or vomiting.  . sertraline (ZOLOFT) 100 MG tablet Take 1 tablet (100 mg total) by mouth daily.  . simvastatin (ZOCOR) 40 MG tablet Take 1 tablet (40 mg total) by mouth daily.  . [DISCONTINUED] allopurinol (ZYLOPRIM) 300 MG tablet Take 1 tablet (300 mg total) by mouth daily.  Allergies:   Patient has no active allergies.   Social History   Socioeconomic History  . Marital status: Single    Spouse name: Not on file  . Number of children: 1  . Years of education: Not on file  . Highest education level: Not on file  Occupational History  . Occupation: Disabled  Tobacco Use  . Smoking status: Never Smoker  . Smokeless tobacco: Never Used  Vaping Use  . Vaping Use: Never used  Substance and Sexual Activity  . Alcohol use: No  . Drug use: No  . Sexual activity: Not Currently  Other Topics Concern  . Not on file  Social History Narrative  . Not on file   Social  Determinants of Health   Financial Resource Strain: Not on file  Food Insecurity: Not on file  Transportation Needs: Not on file  Physical Activity: Not on file  Stress: Not on file  Social Connections: Not on file     Family History: The patient's family history includes Depression in her brother, father, and sister; Diabetes in her brother, brother, and maternal grandmother; Heart disease in her brother, maternal grandfather, and paternal grandmother; Hyperlipidemia in her brother; Mental illness in her brother and sister; Schizophrenia in her brother and sister. There is no history of Colon polyps, Prostate cancer, Rectal cancer, Stomach cancer, or Pancreatic cancer.  ROS:   Please see the history of present illness.    Review of Systems  Constitutional: Negative for chills and fever.  HENT: Negative for sore throat.   Eyes: Negative for blurred vision and redness.  Respiratory: Negative for shortness of breath.   Cardiovascular: Negative for chest pain, palpitations, orthopnea, claudication, leg swelling and PND.  Gastrointestinal: Positive for abdominal pain. Negative for melena.  Genitourinary: Negative for hematuria.  Musculoskeletal: Negative for falls.  Neurological: Negative for dizziness and loss of consciousness.  Endo/Heme/Allergies: Negative for polydipsia.  Psychiatric/Behavioral: Negative for substance abuse.    EKGs/Labs/Other Studies Reviewed:    The following studies were reviewed today: TTE 05/26/2020: 1. Left ventricular ejection fraction, by estimation, is 60 to 65%. The  left ventricle has normal function. The left ventricle has no regional  wall motion abnormalities. Left ventricular diastolic parameters are  consistent with Grade II diastolic  dysfunction (pseudonormalization).  2. Right ventricular systolic function is normal. The right ventricular  size is normal. There is normal pulmonary artery systolic pressure.  3. Left atrial size was mildly  dilated.  4. The mitral valve is normal in structure. Mild mitral valve  regurgitation. No evidence of mitral stenosis.  5. The aortic valve is normal in structure. Aortic valve regurgitation is  not visualized. No aortic stenosis is present.  6. The inferior vena cava is normal in size with greater than 50%  respiratory variability, suggesting right atrial pressure of 3 mmHg.   Coronary CTA 05/27/20: IMPRESSION: 1. Coronary artery calcium score 1.45 Agatston units. This places the patient in the 65th percentile for age and gender, suggesting intermediate risk for future cardiac events.  2. Mild nonobstructive CAD.  07/26/19 Lower extremity venous ultrasound: FINDINGS: VENOUS Normal compressibility of the common femoral, superficial femoral, and popliteal veins, as well as the visualized calf veins. Visualized portions of profunda femoral vein and great saphenous vein unremarkable. No filling defects to suggest DVT on grayscale or color Doppler imaging. Doppler waveforms show normal direction of venous flow, normal respiratory phasicity and response to augmentation.  Limited views of the contralateral common femoral vein are unremarkable.  OTHER  None.  Limitations: none  IMPRESSION: No femoropopliteal DVT nor evidence of DVT within the visualized calf veins.  If clinical symptoms are inconsistent or if there are persistent or worsening symptoms, further imaging (possibly involving the iliac veins) may be warranted.  CTA chest 11/2019: FINDINGS:  LOWER NECK AND AXILLA: There is no lymphadenopathy. The thyroid gland appears normal.   HEART AND VESSELS: Heart size appears enlarged. There is no pericardial effusion. Scattered coronary artery calcifications are identified. Scattered aortic calcifications are present. The aorta and great vessels are normal in caliber.  The main pulmonary artery is normal in caliber. There are no central filling  defects to suggest central pulmonary embolism.   MEDIASTINUM AND HILA: There is no lymphadenopathy or mass. Benign calcified lymph nodes are present. The esophagus is markedly dilated and thick walled.   LUNG AND AIRWAYS: No focal airspace consolidation is seen. Minimal bibasilar dependent atelectasis is identified. The central airways are patent. There is no bronchiectasis or honeycombing. No suspicious nodule or mass.   PLEURA: There is no pneumothorax or pleural effusion.   BONES AND CHEST WALL: Soft tissue structures appear grossly normal. Osseous structures appear intact. There is multilevel vertebral body spurring.   UPPER ABDOMEN: Visualized upper abdominal structures appear grossly normal.   IMPRESSION:  1. No evidence of acute cardiopulmonary abnormality. No pulmonary embolus or focal airspace disease.  2. Cardiomegaly.  3. Markedly dilated thick-walled esophagus.    Recent Labs: 01/28/2020: BNP 8.2 06/01/2020: ALT 8; BUN 15; Creatinine, Ser 0.77; Hemoglobin 12.6; Platelets 289.0; Potassium 4.2; Sodium 143  Recent Lipid Panel    Component Value Date/Time   CHOL 199 01/28/2020 1611   TRIG 151 (H) 01/28/2020 1611   HDL 68 01/28/2020 1611   CHOLHDL 2.9 01/28/2020 1611   LDLCALC 105 (H) 01/28/2020 1611     Physical Exam:    VS:  BP 122/74   Pulse (!) 55   Ht 5\' 4"  (1.626 m)   Wt 219 lb (99.3 kg)   SpO2 96%   BMI 37.59 kg/m     Wt Readings from Last 3 Encounters:  08/03/20 219 lb (99.3 kg)  06/03/20 212 lb (96.2 kg)  06/01/20 212 lb 6 oz (96.3 kg)     GEN:  Well nourished, well developed in no acute distress HEENT: Normal NECK: No JVD; No carotid bruits CARDIAC: RRR, no murmurs, rubs, gallops RESPIRATORY:  Clear to auscultation without rales, wheezing or rhonchi  ABDOMEN: Soft, non-tender, non-distended MUSCULOSKELETAL:  No edema; No deformity  SKIN: Warm and dry NEUROLOGIC:  Alert and oriented x 3 PSYCHIATRIC:  Normal affect   ASSESSMENT:     1. Coronary artery disease involving native coronary artery of native heart without angina pectoris   2. Gout involving toe, unspecified cause, unspecified chronicity, unspecified laterality   3. OSA (obstructive sleep apnea)   4. Essential hypertension   5. Abnormal CT scan, esophagus   6. Pure hypercholesterolemia   7. Esophageal spasm   8. Obesity (BMI 30-39.9)   9. Precordial pain    PLAN:    In order of problems listed above:  #Non-obstructive coronary artery disease: #Coronary calcifications: Patient with episode of neck, arm, jaw pain that occurred at rest while visiting 06/03/20. Went to OSH ED where work-up was reportedly normal. Coronary CTA here with mild LAD disease. TTE with normal BiV function. Likely symptoms driving by esophageal spasm as detailed below. -Coronary CTA with mild disease -Continue ASA 81mg  daily -Continue simvastatin 40mg  daily  #  Esophageal Spasm: Coronary CT showed severely dilated esophagus. Fortunately, seen by GI and is not malignant in nature. Thought to be due to esophageal spasm. Now on PPI and peppermint oil with improvement. -Follow-up with GI as scheduled  #Hypertension: Well controlled.  -Lisinopril 20mg   #Daytime somnolence: #Snoring: -Followed by Dr. -Recommended for CPAP  #HLD Managed by PCP. Last LDL 105. -Continue simvastatin 40mg  daily  #Obesity with BMI 37.6: Patient very motivated to lose weight and has been increasing her daily exercise. Discussed mediterranean diet today and will look into ozempic coverage given elevated blood glucoses as well. -Plan for ozempic if affordable for the patient -Diet and exercise recommendations as below  Exercise recommendations: Goal of exercising for at least 30 minutes a day, at least 5 times per week.  Please exercise to a moderate exertion.  This means that while exercising it is difficult to speak in full sentences, however you are not so short of breath that you feel you  must stop, and not so comfortable that you can carry on a full conversation.  Exertion level should be approximately a 5/10, if 10 is the most exertion you can perform.  Diet recommendations: Recommend a heart healthy diet such as the Mediterranean diet.  This diet consists of plant based foods, healthy fats, lean meats, olive oil.  It suggests limiting the intake of simple carbohydrates such as white breads, pastries, and pastas.  It also limits the amount of red meat, wine, and dairy products such as cheese that one should consume on a daily basis.      Medication Adjustments/Labs and Tests Ordered: Current medicines are reviewed at length with the patient today.  Concerns regarding medicines are outlined above.  No orders of the defined types were placed in this encounter.  Meds ordered this encounter  Medications  . allopurinol (ZYLOPRIM) 300 MG tablet    Sig: Take 1 tablet (300 mg total) by mouth daily.    Dispense:  90 tablet    Refill:  1    Patient Instructions  Medication Instructions:  Your physician recommends that you continue on your current medications as directed. Please refer to the Current Medication list given to you today.  *If you need a refill on your cardiac medications before your next appointment, please call your pharmacy*   Lab Work: None If you have labs (blood work) drawn today and your tests are completely normal, you will receive your results only by: Mayford Knife MyChart Message (if you have MyChart) OR . A paper copy in the mail If you have any lab test that is abnormal or we need to change your treatment, we will call you to review the results.   Testing/Procedures: None   Follow-Up: At [facility], you and your health needs are our priority.  As part of our continuing mission to provide you with exceptional heart care, we have created designated Provider Care Teams.  These Care Teams include your primary Cardiologist (physician) and Advanced Practice  Providers (APPs -  Physician Assistants and Nurse Practitioners) who all work together to provide you with the care you need, when you need it.  We recommend signing up for the patient portal called "MyChart".  Sign up information is provided on this After Visit Summary.  MyChart is used to connect with patients for Virtual Visits (Telemedicine).  Patients are able to view lab/test results, encounter notes, upcoming appointments, etc.  Non-urgent messages can be sent to your provider as well.   To learn  more about what you can do with MyChart, go to [email or web address].    Your next appointment:   6 month(s)  The format for your next appointment:   In Person  Provider:   You may see Meriam Sprague, MD or one of the following Advanced Practice Providers on your designated Care Team:    Tereso Newcomer, PA-C  Chelsea Aus, [place]    Other Instructions      Signed, Meriam Sprague, MD  08/03/2020 5:06 PM    [facility]

## 2020-08-03 ENCOUNTER — Ambulatory Visit (INDEPENDENT_AMBULATORY_CARE_PROVIDER_SITE_OTHER): Payer: Medicare Other | Admitting: Cardiology

## 2020-08-03 ENCOUNTER — Other Ambulatory Visit: Payer: Self-pay

## 2020-08-03 ENCOUNTER — Encounter: Payer: Self-pay | Admitting: Cardiology

## 2020-08-03 ENCOUNTER — Telehealth: Payer: Self-pay | Admitting: Pharmacist

## 2020-08-03 VITALS — BP 122/74 | HR 55 | Ht 64.0 in | Wt 219.0 lb

## 2020-08-03 DIAGNOSIS — M109 Gout, unspecified: Secondary | ICD-10-CM | POA: Diagnosis not present

## 2020-08-03 DIAGNOSIS — K224 Dyskinesia of esophagus: Secondary | ICD-10-CM | POA: Diagnosis not present

## 2020-08-03 DIAGNOSIS — I2510 Atherosclerotic heart disease of native coronary artery without angina pectoris: Secondary | ICD-10-CM

## 2020-08-03 DIAGNOSIS — R933 Abnormal findings on diagnostic imaging of other parts of digestive tract: Secondary | ICD-10-CM

## 2020-08-03 DIAGNOSIS — G4733 Obstructive sleep apnea (adult) (pediatric): Secondary | ICD-10-CM

## 2020-08-03 DIAGNOSIS — I10 Essential (primary) hypertension: Secondary | ICD-10-CM | POA: Diagnosis not present

## 2020-08-03 DIAGNOSIS — R072 Precordial pain: Secondary | ICD-10-CM | POA: Diagnosis not present

## 2020-08-03 DIAGNOSIS — E7800 Pure hypercholesterolemia, unspecified: Secondary | ICD-10-CM

## 2020-08-03 DIAGNOSIS — E669 Obesity, unspecified: Secondary | ICD-10-CM

## 2020-08-03 MED ORDER — ALLOPURINOL 300 MG PO TABS: 300.0000 mg | ORAL_TABLET | Freq: Every day | ORAL | 1 refills | Status: DC

## 2020-08-03 NOTE — Telephone Encounter (Signed)
Consulted by Dr Johney Frame to start [facility] therapy for weight loss, added benefit as pt has diabetes and already takes metformin.  Pt has Medicare so only DM dosing of GLP1RA will be covered. Ozempic is Tier 3 on her formulary with $47/month copay after $95 deductible is met.  Will call pt to see if she is ok with this copay.

## 2020-08-03 NOTE — Patient Instructions (Signed)
Medication Instructions:  Your physician recommends that you continue on your current medications as directed. Please refer to the Current Medication list given to you today.  *If you need a refill on your cardiac medications before your next appointment, please call your pharmacy*   Lab Work: None If you have labs (blood work) drawn today and your tests are completely normal, you will receive your results only by: Marland Kitchen MyChart Message (if you have MyChart) OR . A paper copy in the mail If you have any lab test that is abnormal or we need to change your treatment, we will call you to review the results.   Testing/Procedures: None   Follow-Up: At [facility], you and your health needs are our priority.  As part of our continuing mission to provide you with exceptional heart care, we have created designated Provider Care Teams.  These Care Teams include your primary Cardiologist (physician) and Advanced Practice Providers (APPs -  Physician Assistants and Nurse Practitioners) who all work together to provide you with the care you need, when you need it.  We recommend signing up for the patient portal called "MyChart".  Sign up information is provided on this After Visit Summary.  MyChart is used to connect with patients for Virtual Visits (Telemedicine).  Patients are able to view lab/test results, encounter notes, upcoming appointments, etc.  Non-urgent messages can be sent to your provider as well.   To learn more about what you can do with MyChart, go to [email or web address].    Your next appointment:   6 month(s)  The format for your next appointment:   In Person  Provider:   You may see Meriam Sprague, MD or one of the following Advanced Practice Providers on your designated Care Team:    Tereso Newcomer, PA-C  Chelsea Aus, [place]    Other Instructions

## 2020-08-04 NOTE — Telephone Encounter (Signed)
Left message for pt to discuss.

## 2020-08-05 NOTE — Telephone Encounter (Signed)
Patient returned call. I shared the information with her below. Patient unsure if she could afford. I have her the website [employer] to see if she qualifies for patient assistance. Also provided her with the phone number.

## 2020-08-26 ENCOUNTER — Ambulatory Visit: Payer: Medicare Other | Admitting: Gastroenterology

## 2020-09-23 ENCOUNTER — Telehealth: Payer: Self-pay | Admitting: Pharmacist

## 2020-09-23 NOTE — Telephone Encounter (Signed)
Patient approved for patient assistance through [employer]. Ozmepic will be mailed to our office per [employer] policy.  Called pt to let her know she was approved and see if she was still interested in starting. LVM for her to call back

## 2020-09-24 ENCOUNTER — Encounter: Payer: Self-pay | Admitting: *Deleted

## 2020-09-24 NOTE — Telephone Encounter (Signed)
Meriam Sprague, MD  You 7 minutes ago (11:54 AM)   HP  To whom it may concern,   Please allow Ms. Christine Hopkins to have a cat in her home for mental health and emotional support. This will benefit her overall health and well-being. If you have any questions or concerns, please feel free to reach out to our office.   Sincerely,  Laurance Flatten     Letter created for the pt as indicated above per Dr. Shari Prows, for her to be allowed to have a pet for mental and emotional support. Letter created and sent to the pt via mychart, and will also mail a copy to her address on file. Pt made aware of this via mychart message.

## 2020-10-06 ENCOUNTER — Telehealth: Payer: Self-pay | Admitting: Pharmacist

## 2020-10-06 DIAGNOSIS — E1165 Type 2 diabetes mellitus with hyperglycemia: Secondary | ICD-10-CM

## 2020-10-06 MED ORDER — OZEMPIC (0.25 OR 0.5 MG/DOSE) 2 MG/1.5ML ~~LOC~~ SOPN: PEN_INJECTOR | SUBCUTANEOUS | 1 refills | Status: DC

## 2020-10-06 MED ORDER — OZEMPIC (0.25 OR 0.5 MG/DOSE) 2 MG/1.5ML ~~LOC~~ SOPN: 0.5000 mg | PEN_INJECTOR | SUBCUTANEOUS | 1 refills | Status: DC

## 2020-10-06 NOTE — Telephone Encounter (Signed)
Patient's Ozempic from Patient Assistance arrived today.  2 pens Lot: [id number] Exp 05/20/23  Tried to call patient, no answer and mailbox full

## 2020-10-06 NOTE — Addendum Note (Signed)
Addended by: Cheree Ditto on: 10/06/2020 10:38 AM   Modules accepted: Orders

## 2020-10-07 NOTE — Telephone Encounter (Signed)
2nd call made to pt, no answer, VM still full.

## 2020-10-08 NOTE — Telephone Encounter (Signed)
Called pt again. She has not started Ozempic yet, will have her come to the office on Monday for injection training. Appt scheduled for 11am.

## 2020-10-09 ENCOUNTER — Telehealth: Payer: Self-pay | Admitting: Registered Nurse

## 2020-10-09 NOTE — Telephone Encounter (Signed)
Patient needs 1 500 mg. Keflex called in to [facility] on [address] - she is having a dental procedure done on Monday.  Please advise

## 2020-10-12 ENCOUNTER — Other Ambulatory Visit: Payer: Self-pay

## 2020-10-12 ENCOUNTER — Ambulatory Visit (INDEPENDENT_AMBULATORY_CARE_PROVIDER_SITE_OTHER): Payer: Medicare Other | Admitting: Pharmacist

## 2020-10-12 ENCOUNTER — Encounter: Payer: Self-pay | Admitting: Pharmacist

## 2020-10-12 ENCOUNTER — Other Ambulatory Visit: Payer: Self-pay | Admitting: Registered Nurse

## 2020-10-12 VITALS — BP 136/94 | HR 65 | Wt 218.6 lb

## 2020-10-12 DIAGNOSIS — E1165 Type 2 diabetes mellitus with hyperglycemia: Secondary | ICD-10-CM

## 2020-10-12 DIAGNOSIS — Z792 Long term (current) use of antibiotics: Secondary | ICD-10-CM

## 2020-10-12 MED ORDER — CEPHALEXIN 500 MG PO CAPS: 500.0000 mg | ORAL_CAPSULE | Freq: Four times a day (QID) | ORAL | 0 refills | Status: DC

## 2020-10-12 NOTE — Telephone Encounter (Signed)
Sent Thanks Rich

## 2020-10-12 NOTE — Progress Notes (Signed)
Patient ID: Christine Hopkins                 DOB: Feb 04, 1956                    MRN: [id number]     HPI: Christine Hopkins is a 65 y.o. female patient referred to pharmacy clinic by Dr Shari Prows to initiate weight loss therapy with GLP1-RA. PMH is significant for obesity complicated by chronic medical conditions including HTN, dysphagia, T2DM, and HLD. Most recent BMI 37.52.  Patient is on disability and not working so has an odd sleep schedule.  Reports she will stay up very late at night. Last night went to bed around 4AM.  Drinks a fair amount of caffiene from diet cokes and pepsi.  Does not eat much due to her espohageal dysphagia.  Frequently will skip breakfast or turn it into an early lunch.  Dinner is balanced. Typically eats salads and vegetables. To save money she has been eating a lot of beans and rice.  Her biggest weakness she reports is snacking in which she will eat candy bars, popcorn, and cheesecake. She thinks this is the cause of most of her weight gain.  Previously has success using a ketogenic diet but has since gained the weight back.  DM well controlled.  Last A1c 6.1 on 01/28/20.  Current weight management medications: n/a  Previously tried meds: n/a  Current meds that may affect weight: n/a  Baseline weight/BMI: 37.52  Insurance payor: [employer]are  Diet:  -Breakfast: does not eat usually eat breakfast -Lunch: boiled egg, strawberries, oatmeal -Dinner: salad, cucumber, beans, rice -Snacks: candy bars, cheesecake -Drinks: diet ginger ale, diet coke, diet pepsi, tang, lemonade, water  Exercise: Belongs to [employer].  Walks 17 laps (1/2 mile).  Goes about 1 to 2 days a week  Family History: brothers have diabetes  Social History: no EtOH, no tobacco, illit meds  Labs: Lab Results  Component Value Date   HGBA1C 6.1 (H) 01/28/2020    Wt Readings from Last 1 Encounters:  08/03/20 219 lb (99.3 kg)    BP Readings from Last 1 Encounters:  08/03/20  122/74   Pulse Readings from Last 1 Encounters:  08/03/20 (!) 55       Component Value Date/Time   CHOL 199 01/28/2020 1611   TRIG 151 (H) 01/28/2020 1611   HDL 68 01/28/2020 1611   CHOLHDL 2.9 01/28/2020 1611   LDLCALC 105 (H) 01/28/2020 1611    Past Medical History:  Diagnosis Date  . Allergy   . Anxiety   . Arthritis   . Depression   . Diabetes mellitus without complication (HCC)   . GERD (gastroesophageal reflux disease)   . History of back surgery   . Hyperlipidemia   . Hypertension   . Mild sleep apnea   . Neuromuscular disorder (HCC)   . OCD (obsessive compulsive disorder) 04/05/2019    Current Outpatient Medications on File Prior to Visit  Medication Sig Dispense Refill  . allopurinol (ZYLOPRIM) 300 MG tablet Take 1 tablet (300 mg total) by mouth daily. 90 tablet 1  . aspirin EC 81 MG tablet Take 325 mg by mouth daily. Swallow whole.    . colchicine 0.6 MG tablet Take 1 tablet bid for the 5 days for gout attack (Patient taking differently: as needed. Take as needed for gout attack) 10 tablet 0  . ibuprofen (ADVIL,MOTRIN) 800 MG tablet Take 1 tablet (800 mg total) by mouth 3 (three)  times daily. (Patient taking differently: Take 800 mg by mouth as needed.) 90 tablet 0  . indomethacin (INDOCIN) 50 MG capsule Take 1 capsule (50 mg total) by mouth 3 (three) times daily as needed. 90 capsule 1  . lisinopril (ZESTRIL) 20 MG tablet Take 1 tablet (20 mg total) by mouth daily. 90 tablet 1  . meloxicam (MOBIC) 7.5 MG tablet Take 1 tablet (7.5 mg total) by mouth daily. (Patient taking differently: Take 7.5 mg by mouth as needed.) 30 tablet 0  . metFORMIN (GLUCOPHAGE) 1000 MG tablet Take 1 tablet (1,000 mg total) by mouth 2 (two) times daily with a meal. 180 tablet 3  . omeprazole (PRILOSEC) 40 MG capsule Take 1 capsule (40 mg total) by mouth daily. 30 capsule 3  . ondansetron (ZOFRAN) 8 MG tablet Take 1 tablet (8 mg total) by mouth every 8 (eight) hours as needed for nausea or  vomiting. 20 tablet 0  . Semaglutide,0.25 or 0.5MG /DOS, (OZEMPIC, 0.25 OR 0.5 MG/DOSE,) 2 MG/1.5ML SOPN Inject 0.25mg  weekly for 4 weeks then inject 0.5mg  weekly. 1.5 mL 1  . sertraline (ZOLOFT) 100 MG tablet Take 1 tablet (100 mg total) by mouth daily. 90 tablet 3  . simvastatin (ZOCOR) 40 MG tablet Take 1 tablet (40 mg total) by mouth daily. 90 tablet 2   No current facility-administered medications on file prior to visit.    No Active Allergies   Assessment/Plan:  1. Weight loss - Pharmacotherapy is appropriate to pursue as augmentation. Will start Ozempic. Confirmed patient not pregnant and no personal or family history of medullary thyroid carcinoma (MTC) or Multiple Endocrine Neoplasia syndrome type 2 (MEN 2).   Advised patient on common side effects including nausea, diarrhea, dyspepsia, decreased appetite, and fatigue. Counseled patient on reducing meal size and how to titrate medication to minimize side effects. Counseled patient to call if intolerable side effects or if experiencing dehydration, abdominal pain, or dizziness. Patient will adhere to dietary modifications and will target at least 150 minutes of moderate intensity exercise weekly.   Discussed mechanism of action of Ozempic.  Using demo pen, demonstrated to patient how to apply pen needle, prime pen, and turn to 0.25mg  followed by 0.5mg  after 4 weeks.  Patient voiced understanding.  Of note, patient's BP elevated today. However she reports she has not taken her BP medication this morning and has been drinking caffeine.    Laural Golden, PharmD, BCACP, CDCES, CPP [facility] [address] [place], [place] [address] Phone: [phone]; Fax: [phone] 10/12/2020 12:13 PM

## 2020-10-12 NOTE — Patient Instructions (Signed)
It was nice meeting you today!  We are going to start a new medication today called Ozempic.  You will start 0.25mg  once a week for 4 weeks.  Then increase to 0.5mg  once a week.  When you are getting close to finishing your second pen please let us know.  Remember to start eating smaller portions to avoid stomach upset  Concentrate on vegetables, proteins, and healthy fats.  Work on reducing the amount of carbohydrates you are eating.  Please call with any questions  Laural Golden, PharmD, BCACP, CDCES, CPP [facility] [address], [place], [place] [address] Phone: [phone]; Fax: [phone] 10/12/2020 11:37 AM

## 2020-11-05 ENCOUNTER — Telehealth: Payer: Self-pay | Admitting: Pharmacist

## 2020-11-05 NOTE — Telephone Encounter (Signed)
Spoke with patient about her Ozempic. She states she is doing well, no real side effects, ready to increase to 0.5mg  which she is due for on 5/23. Will send in new Rx for 1mg  pen so that it ships from patient assistance through by the time she is due for it. Will give 4 injections of 0.5mg  starting 5/23. I will call patient in 4 weeks to follow up.

## 2020-12-23 ENCOUNTER — Other Ambulatory Visit: Payer: Self-pay

## 2020-12-23 MED ORDER — ALLOPURINOL 300 MG PO TABS: 300.0000 mg | ORAL_TABLET | Freq: Every day | ORAL | 1 refills | Status: DC

## 2020-12-23 NOTE — Telephone Encounter (Signed)
Okay to refill? 

## 2020-12-23 NOTE — Telephone Encounter (Signed)
[facility] mail order pharmacy is requesting a refill allopurinol. Would Dr. Shari Prows like to refill this medication? Please address

## 2021-01-07 ENCOUNTER — Other Ambulatory Visit: Payer: Self-pay | Admitting: Registered Nurse

## 2021-01-07 DIAGNOSIS — I10 Essential (primary) hypertension: Secondary | ICD-10-CM

## 2021-01-07 DIAGNOSIS — E785 Hyperlipidemia, unspecified: Secondary | ICD-10-CM

## 2021-02-20 ENCOUNTER — Other Ambulatory Visit: Payer: Self-pay | Admitting: Registered Nurse

## 2021-02-20 DIAGNOSIS — I10 Essential (primary) hypertension: Secondary | ICD-10-CM

## 2021-02-20 DIAGNOSIS — E785 Hyperlipidemia, unspecified: Secondary | ICD-10-CM

## 2021-03-11 ENCOUNTER — Telehealth: Payer: Self-pay

## 2021-03-11 NOTE — Telephone Encounter (Signed)
Called and spoke w/representative for cancellation of ozempic as the pt experienced a side effect

## 2021-03-20 ENCOUNTER — Other Ambulatory Visit: Payer: Self-pay | Admitting: Registered Nurse

## 2021-03-20 DIAGNOSIS — E785 Hyperlipidemia, unspecified: Secondary | ICD-10-CM

## 2021-03-20 DIAGNOSIS — I10 Essential (primary) hypertension: Secondary | ICD-10-CM

## 2021-04-17 ENCOUNTER — Other Ambulatory Visit: Payer: Self-pay | Admitting: Registered Nurse

## 2021-04-17 DIAGNOSIS — E785 Hyperlipidemia, unspecified: Secondary | ICD-10-CM

## 2021-04-17 DIAGNOSIS — I10 Essential (primary) hypertension: Secondary | ICD-10-CM

## 2021-04-19 ENCOUNTER — Other Ambulatory Visit: Payer: Self-pay

## 2021-04-24 ENCOUNTER — Encounter: Payer: Self-pay | Admitting: Registered Nurse

## 2021-04-26 ENCOUNTER — Ambulatory Visit (INDEPENDENT_AMBULATORY_CARE_PROVIDER_SITE_OTHER): Payer: Medicare Other | Admitting: Registered Nurse

## 2021-04-26 DIAGNOSIS — Z23 Encounter for immunization: Secondary | ICD-10-CM

## 2021-05-11 ENCOUNTER — Ambulatory Visit (INDEPENDENT_AMBULATORY_CARE_PROVIDER_SITE_OTHER): Payer: Medicare Other | Admitting: Registered Nurse

## 2021-05-11 VITALS — BP 128/76 | HR 67 | Temp 98.0°F | Resp 16 | Ht 64.0 in | Wt 199.2 lb

## 2021-05-11 DIAGNOSIS — E785 Hyperlipidemia, unspecified: Secondary | ICD-10-CM | POA: Diagnosis not present

## 2021-05-11 DIAGNOSIS — Z5320 Procedure and treatment not carried out because of patient's decision for unspecified reasons: Secondary | ICD-10-CM

## 2021-05-11 DIAGNOSIS — Z23 Encounter for immunization: Secondary | ICD-10-CM | POA: Diagnosis not present

## 2021-05-11 DIAGNOSIS — E1165 Type 2 diabetes mellitus with hyperglycemia: Secondary | ICD-10-CM

## 2021-05-11 DIAGNOSIS — Z1231 Encounter for screening mammogram for malignant neoplasm of breast: Secondary | ICD-10-CM

## 2021-05-11 DIAGNOSIS — Z0000 Encounter for general adult medical examination without abnormal findings: Secondary | ICD-10-CM | POA: Diagnosis not present

## 2021-05-11 DIAGNOSIS — I10 Essential (primary) hypertension: Secondary | ICD-10-CM

## 2021-05-11 DIAGNOSIS — E2839 Other primary ovarian failure: Secondary | ICD-10-CM

## 2021-05-11 DIAGNOSIS — M791 Myalgia, unspecified site: Secondary | ICD-10-CM

## 2021-05-11 DIAGNOSIS — M109 Gout, unspecified: Secondary | ICD-10-CM

## 2021-05-11 DIAGNOSIS — F4321 Adjustment disorder with depressed mood: Secondary | ICD-10-CM

## 2021-05-11 LAB — HEMOGLOBIN A1C: Hgb A1c MFr Bld: 6 % (ref 4.6–6.5)

## 2021-05-11 LAB — COMPREHENSIVE METABOLIC PANEL
ALT: 11 U/L (ref 0–35)
AST: 18 U/L (ref 0–37)
Albumin: 4.1 g/dL (ref 3.5–5.2)
Alkaline Phosphatase: 65 U/L (ref 39–117)
BUN: 11 mg/dL (ref 6–23)
CO2: 30 mEq/L (ref 19–32)
Calcium: 9.3 mg/dL (ref 8.4–10.5)
Chloride: 109 mEq/L (ref 96–112)
Creatinine, Ser: 0.79 mg/dL (ref 0.40–1.20)
GFR: 78.3 mL/min (ref >60.00)
Glucose, Bld: 87 mg/dL (ref 70–99)
Potassium: 3.9 mEq/L (ref 3.5–5.1)
Sodium: 146 mEq/L — ABNORMAL HIGH (ref 135–145)
Total Bilirubin: 0.3 mg/dL (ref 0.2–1.2)
Total Protein: 6.4 g/dL (ref 6.0–8.3)

## 2021-05-11 LAB — URINALYSIS, ROUTINE W REFLEX MICROSCOPIC
Bilirubin Urine: NEGATIVE
Ketones, ur: NEGATIVE
Nitrite: NEGATIVE
Specific Gravity, Urine: 1.025 (ref 1.000–1.030)
Total Protein, Urine: NEGATIVE
Urine Glucose: NEGATIVE
Urobilinogen, UA: 0.2 (ref 0.0–1.0)
pH: 5.5 (ref 5.0–8.0)

## 2021-05-11 LAB — CBC WITH DIFFERENTIAL/PLATELET
Basophils Absolute: 0 10*3/uL (ref 0.0–0.1)
Basophils Relative: 0.5 % (ref 0.0–3.0)
Eosinophils Absolute: 0.2 10*3/uL (ref 0.0–0.7)
Eosinophils Relative: 2.1 % (ref 0.0–5.0)
HCT: 38.5 % (ref 36.0–46.0)
Hemoglobin: 12.5 g/dL (ref 12.0–15.0)
Lymphocytes Relative: 32.8 % (ref 12.0–46.0)
Lymphs Abs: 2.6 10*3/uL (ref 0.7–4.0)
MCHC: 32.4 g/dL (ref 30.0–36.0)
MCV: 86.2 fl (ref 78.0–100.0)
Monocytes Absolute: 0.3 10*3/uL (ref 0.1–1.0)
Monocytes Relative: 4.2 % (ref 3.0–12.0)
Neutro Abs: 4.9 10*3/uL (ref 1.4–7.7)
Neutrophils Relative %: 60.4 % (ref 43.0–77.0)
Platelets: 252 10*3/uL (ref 150.0–400.0)
RBC: 4.46 Mil/uL (ref 3.87–5.11)
RDW: 15.9 % — ABNORMAL HIGH (ref 11.5–15.5)
WBC: 8 10*3/uL (ref 4.0–10.5)

## 2021-05-11 LAB — LIPID PANEL
Cholesterol: 197 mg/dL (ref 0–200)
HDL: 54.2 mg/dL (ref >39.00)
LDL Cholesterol: 115 mg/dL — ABNORMAL HIGH (ref 0–99)
NonHDL: 142.76
Total CHOL/HDL Ratio: 4
Triglycerides: 138 mg/dL (ref 0.0–149.0)
VLDL: 27.6 mg/dL (ref 0.0–40.0)

## 2021-05-11 LAB — MICROALBUMIN / CREATININE URINE RATIO
Creatinine,U: 102.7 mg/dL
Microalb Creat Ratio: 1.5 mg/g (ref 0.0–30.0)
Microalb, Ur: 1.5 mg/dL (ref 0.0–1.9)

## 2021-05-11 LAB — TSH: TSH: 1.55 u[IU]/mL (ref 0.35–5.50)

## 2021-05-11 MED ORDER — SIMVASTATIN 40 MG PO TABS: 40.0000 mg | ORAL_TABLET | Freq: Every day | ORAL | 1 refills | Status: DC

## 2021-05-11 MED ORDER — LISINOPRIL 20 MG PO TABS: 20.0000 mg | ORAL_TABLET | Freq: Every day | ORAL | 1 refills | Status: DC

## 2021-05-11 MED ORDER — INDOMETHACIN 50 MG PO CAPS: 50.0000 mg | ORAL_CAPSULE | Freq: Three times a day (TID) | ORAL | 1 refills | Status: DC | PRN

## 2021-05-11 MED ORDER — SERTRALINE HCL 100 MG PO TABS: 100.0000 mg | ORAL_TABLET | Freq: Every day | ORAL | 3 refills | Status: DC

## 2021-05-11 MED ORDER — ZOSTER VAC RECOMB ADJUVANTED 50 MCG/0.5ML IM SUSR: 0.5000 mL | Freq: Once | INTRAMUSCULAR | 1 refills | Status: AC

## 2021-05-11 MED ORDER — IBUPROFEN 800 MG PO TABS: 800.0000 mg | ORAL_TABLET | Freq: Two times a day (BID) | ORAL | 0 refills | Status: DC | PRN

## 2021-05-11 MED ORDER — COLCHICINE 0.6 MG PO TABS: 0.6000 mg | ORAL_TABLET | Freq: Two times a day (BID) | ORAL | 0 refills | Status: DC | PRN

## 2021-05-11 MED ORDER — ALLOPURINOL 300 MG PO TABS: 300.0000 mg | ORAL_TABLET | Freq: Every day | ORAL | 1 refills | Status: DC

## 2021-05-11 NOTE — Patient Instructions (Addendum)
Ms. Christo -   Christine Hopkins to see you.  Labs should be back this afternoon - I'll call with any concerns.  I have refilled medications that you are taking for six months - I would like to see you back at that time to recheck on a1c and cholesterol numbers and ensure blood pressure remains well controlled.   No concerning findings on exam.  I have ordered a mammogram, a DEXA scan for bone density, a referral to gynecology for a Pap smear,  a referral to ophthalmology and podiatry for routine diabetes care.   Check out Yoga With Adriene on YouTube for some great yoga/stretching exercises. This can help hips and lower back a lot.  I will let you know if anything concerning arises, otherwise, have a happy holiday season and let me know if you need anything!   Thanks,  Rich    Health Maintenance, Female Adopting a healthy lifestyle and getting preventive care are important in promoting health and wellness. Ask your health care provider about: The right schedule for you to have regular tests and exams. Things you can do on your own to prevent diseases and keep yourself healthy. What should I know about diet, weight, and exercise? Eat a healthy diet  Eat a diet that includes plenty of vegetables, fruits, low-fat dairy products, and lean protein. Do not eat a lot of foods that are high in solid fats, added sugars, or sodium. Maintain a healthy weight Body mass index (BMI) is used to identify weight problems. It estimates body fat based on height and weight. Your health care provider can help determine your BMI and help you achieve or maintain a healthy weight. Get regular exercise Get regular exercise. This is one of the most important things you can do for your health. Most adults should: Exercise for at least 150 minutes each week. The exercise should increase your heart rate and make you sweat (moderate-intensity exercise). Do strengthening exercises at least twice a week. This is in  addition to the moderate-intensity exercise. Spend less time sitting. Even light physical activity can be beneficial. Watch cholesterol and blood lipids Have your blood tested for lipids and cholesterol at 65 years of age, then have this test every 5 years. Have your cholesterol levels checked more often if: Your lipid or cholesterol levels are high. You are older than 65 years of age. You are at high risk for heart disease. What should I know about cancer screening? Depending on your health history and family history, you may need to have cancer screening at various ages. This may include screening for: Breast cancer. Cervical cancer. Colorectal cancer. Skin cancer. Lung cancer. What should I know about heart disease, diabetes, and high blood pressure? Blood pressure and heart disease High blood pressure causes heart disease and increases the risk of stroke. This is more likely to develop in people who have high blood pressure readings or are overweight. Have your blood pressure checked: Every 3-5 years if you are 37-59 years of age. Every year if you are 53 years old or older. Diabetes Have regular diabetes screenings. This checks your fasting blood sugar level. Have the screening done: Once every three years after age 33 if you are at a normal weight and have a low risk for diabetes. More often and at a younger age if you are overweight or have a high risk for diabetes. What should I know about preventing infection? Hepatitis B If you have a higher risk for hepatitis B,  you should be screened for this virus. Talk with your health care provider to find out if you are at risk for hepatitis B infection. Hepatitis C Testing is recommended for: Everyone born from [birth year] through 1965. Anyone with known risk factors for hepatitis C. Sexually transmitted infections (STIs) Get screened for STIs, including gonorrhea and chlamydia, if: You are sexually active and are younger than 65 years of  age. You are older than 65 years of age and your health care provider tells you that you are at risk for this type of infection. Your sexual activity has changed since you were last screened, and you are at increased risk for chlamydia or gonorrhea. Ask your health care provider if you are at risk. Ask your health care provider about whether you are at high risk for HIV. Your health care provider may recommend a prescription medicine to help prevent HIV infection. If you choose to take medicine to prevent HIV, you should first get tested for HIV. You should then be tested every 3 months for as long as you are taking the medicine. Pregnancy If you are about to stop having your period (premenopausal) and you may become pregnant, seek counseling before you get pregnant. Take 400 to 800 micrograms (mcg) of folic acid every day if you become pregnant. Ask for birth control (contraception) if you want to prevent pregnancy. Osteoporosis and menopause Osteoporosis is a disease in which the bones lose minerals and strength with aging. This can result in bone fractures. If you are 60 years old or older, or if you are at risk for osteoporosis and fractures, ask your health care provider if you should: Be screened for bone loss. Take a calcium or vitamin D supplement to lower your risk of fractures. Be given hormone replacement therapy (HRT) to treat symptoms of menopause. Follow these instructions at home: Alcohol use Do not drink alcohol if: Your health care provider tells you not to drink. You are pregnant, may be pregnant, or are planning to become pregnant. If you drink alcohol: Limit how much you have to: 0-1 drink a day. Know how much alcohol is in your drink. In the [place] one drink equals one 12 oz bottle of beer (355 mL), one 5 oz glass of wine (148 mL), or one 1 oz glass of hard liquor (44 mL). Lifestyle Do not use any products that contain nicotine or tobacco. These products include  cigarettes, chewing tobacco, and vaping devices, such as e-cigarettes. If you need help quitting, ask your health care provider. Do not use street drugs. Do not share needles. Ask your health care provider for help if you need support or information about quitting drugs. General instructions Schedule regular health, dental, and eye exams. Stay current with your vaccines. Tell your health care provider if: You often feel depressed. You have ever been abused or do not feel safe at home. Summary Adopting a healthy lifestyle and getting preventive care are important in promoting health and wellness. Follow your health care provider's instructions about healthy diet, exercising, and getting tested or screened for diseases. Follow your health care provider's instructions on monitoring your cholesterol and blood pressure. This information is not intended to replace advice given to you by your health care provider. Make sure you discuss any questions you have with your health care provider. Document Revised: 10/26/2020 Document Reviewed: 10/26/2020 Elsevier Patient Education  2022 Elsevier Inc.     Why follow it? Research shows. Those who follow the Mediterranean diet have a  reduced risk of heart disease  The diet is associated with a reduced incidence of Parkinson's and Alzheimer's diseases People following the diet may have longer life expectancies and lower rates of chronic diseases  The Dietary Guidelines for Americans recommends the Mediterranean diet as an eating plan to promote health and prevent disease  What Is the Mediterranean Diet?  Healthy eating plan based on typical foods and recipes of Mediterranean-style cooking The diet is primarily a plant based diet; these foods should make up a majority of meals   Starches - Plant based foods should make up a majority of meals - They are an important sources of vitamins, minerals, energy, antioxidants, and fiber - Choose whole grains,  foods high in fiber and minimally processed items  - Typical grain sources include wheat, oats, barley, corn, brown rice, bulgar, farro, millet, polenta, couscous  - Various types of beans include chickpeas, lentils, fava beans, black beans, white beans   Fruits  Veggies - Large quantities of antioxidant rich fruits & veggies; 6 or more servings  - Vegetables can be eaten raw or lightly drizzled with oil and cooked  - Vegetables common to the traditional Mediterranean Diet include: artichokes, arugula, beets, broccoli, brussel sprouts, cabbage, carrots, celery, collard greens, cucumbers, eggplant, kale, leeks, lemons, lettuce, mushrooms, okra, onions, peas, peppers, potatoes, pumpkin, radishes, rutabaga, shallots, spinach, sweet potatoes, turnips, zucchini - Fruits common to the Mediterranean Diet include: apples, apricots, avocados, cherries, clementines, dates, figs, grapefruits, grapes, melons, nectarines, oranges, peaches, pears, pomegranates, strawberries, tangerines  Fats - Replace butter and margarine with healthy oils, such as olive oil, canola oil, and tahini  - Limit nuts to no more than a handful a day  - Nuts include walnuts, almonds, pecans, pistachios, pine nuts  - Limit or avoid candied, honey roasted or heavily salted nuts - Olives are central to the [employer] - can be eaten whole or used in a variety of dishes   Meats Protein - Limiting red meat: no more than a few times a month - When eating red meat: choose lean cuts and keep the portion to the size of deck of cards - Eggs: approx. 0 to 4 times a week  - Fish and lean poultry: at least 2 a week  - Healthy protein sources include, chicken, [place], lean beef, lamb - Increase intake of seafood such as tuna, salmon, trout, mackerel, shrimp, scallops - Avoid or limit high fat processed meats such as sausage and bacon  Dairy - Include moderate amounts of low fat dairy products  - Focus on healthy dairy such as fat free  yogurt, skim milk, low or reduced fat cheese - Limit dairy products higher in fat such as whole or 2% milk, cheese, ice cream  Alcohol - Moderate amounts of red wine is ok  - No more than 5 oz daily for women (all ages) and men older than age 34  - No more than 10 oz of wine daily for men younger than 15  Other - Limit sweets and other desserts  - Use herbs and spices instead of salt to flavor foods  - Herbs and spices common to the traditional Mediterranean Diet include: basil, bay leaves, chives, cloves, cumin, fennel, garlic, lavender, marjoram, mint, oregano, parsley, pepper, rosemary, sage, savory, sumac, tarragon, thyme   It's not just a diet, it's a lifestyle:  The Mediterranean diet includes lifestyle factors typical of those in the region  Foods, drinks and meals are best eaten with others and  savored Daily physical activity is important for overall good health This could be strenuous exercise like running and aerobics This could also be more leisurely activities such as walking, housework, yard-work, or taking the stairs Moderation is the key; a balanced and healthy diet accommodates most foods and drinks Consider portion sizes and frequency of consumption of certain foods   Meal Ideas & Options:  Breakfast:  Whole wheat toast or whole wheat English muffins with peanut butter & hard boiled egg Steel cut oats topped with apples & cinnamon and skim milk  Fresh fruit: banana, strawberries, melon, berries, peaches  Smoothies: strawberries, bananas, greek yogurt, peanut butter Low fat greek yogurt with blueberries and granola  Egg white omelet with spinach and mushrooms Breakfast couscous: whole wheat couscous, apricots, skim milk, cranberries  Sandwiches:  Hummus and grilled vegetables (peppers, zucchini, squash) on whole wheat bread   Grilled chicken on whole wheat pita with lettuce, tomatoes, cucumbers or tzatziki  [place] salad on whole wheat bread: tuna salad made with greek  yogurt, olives, red peppers, capers, green onions Garlic rosemary lamb pita: lamb sauted with garlic, rosemary, salt & pepper; add lettuce, cucumber, greek yogurt to pita - flavor with lemon juice and black pepper  Seafood:  Mediterranean grilled salmon, seasoned with garlic, basil, parsley, lemon juice and black pepper Shrimp, lemon, and spinach whole-grain pasta salad made with low fat greek yogurt  Seared scallops with lemon orzo  Seared tuna steaks seasoned salt, pepper, coriander topped with tomato mixture of olives, tomatoes, olive oil, minced garlic, parsley, green onions and cappers  Meats:  Herbed greek chicken salad with kalamata olives, cucumber, feta  Red bell peppers stuffed with spinach, bulgur, lean ground beef (or lentils) & topped with feta   Kebabs: skewers of chicken, tomatoes, onions, zucchini, squash  [place] burgers: made with red onions, mint, dill, lemon juice, feta cheese topped with roasted red peppers Vegetarian Cucumber salad: cucumbers, artichoke hearts, celery, red onion, feta cheese, tossed in olive oil & lemon juice  Hummus and whole grain pita points with a greek salad (lettuce, tomato, feta, olives, cucumbers, red onion) Lentil soup with celery, carrots made with vegetable broth, garlic, salt and pepper  Tabouli salad: parsley, bulgur, mint, scallions, cucumbers, tomato, radishes, lemon juice, olive oil, salt and pepper.      Fat and Cholesterol Restricted Eating Plan Eating a diet that limits fat and cholesterol may help lower your risk for heart disease and other conditions. Your body needs fat and cholesterol for basic functions, but eating too much of these things can be harmful to your health. Your health care provider may order lab tests to check your blood fat (lipid) and cholesterol levels. This helps your health care provider understand your risk for certain conditions and whether you need to make diet changes. Work with your health care provider or  dietitian to make an eating plan that is right for you. Your plan includes: Limit your fat intake to ______% or less of your total calories a day. This is ______g of fat per day. Limit your saturated fat intake to ______% or less of your total calories a day. This is ______g of saturated fat per day. Limit the amount of cholesterol in your diet to less than _________mg a day. Eat ___________ g of fiber a day. What are tips for following this plan? General guidelines If you are overweight, work with your health care provider to lose weight safely. Losing just 5-10% of your body weight can improve  your overall health and help prevent diseases such as diabetes and heart disease. Avoid: Foods with added sugar. Fried foods. Foods that contain partially hydrogenated oils, including stick margarine, some tub margarines, cookies, crackers, and other baked goods. If you drink alcohol: Limit how much you have to: 0-1 drink a day for women who are not pregnant. 0-2 drinks a day for men. Know how much alcohol is in a drink. In the [place] one drink equals one 12 oz bottle of beer (355 mL), one 5 oz glass of wine (148 mL), or one 1 oz glass of hard liquor (44 mL). Reading food labels Check food labels for: Trans fats or partially hydrogenated oils. Avoid foods that contain these. High amounts of saturated fat. Choose foods that are low in saturated fat (less than 2 g). The amount of cholesterol in each serving. The amount of fiber in each serving. Choose foods with healthy fats, such as: Monounsaturated and polyunsaturated fats. These include olive and canola oil, flaxseeds, walnuts, almonds, and seeds. Omega-3 fats. These are found in foods such as salmon, mackerel, sardines, tuna, flaxseed oil, and ground flaxseeds. Choose grain products that have whole grains. Look for the word "whole" as the first word in the ingredient list. Cooking Cook foods using methods other than frying. Baking, boiling,  grilling, and broiling are some healthy options. Eat more home-cooked food and less restaurant, buffet, and fast food. Avoid cooking using saturated fats. Animal sources of saturated fats include meats, butter, and cream. Plant sources of saturated fats include palm oil, palm kernel oil, and coconut oil. Meal planning  At meals, imagine dividing your plate into fourths: Fill one-half of your plate with vegetables, green salads, and fruit. Fill one-fourth of your plate with whole grains. Fill one-fourth of your plate with lean protein foods. Eat fish that is high in omega-3 fats at least two times a week. Eat more foods that contain fiber, such as whole grains, beans, apples, pears, berries, broccoli, carrots, peas, and barley. These foods help promote healthy cholesterol levels in the blood. What foods should I eat? Fruits All fresh, canned (in natural juice), or frozen fruits. Vegetables Fresh or frozen vegetables (raw, steamed, roasted, or grilled). Green salads. Grains Whole grains, such as whole wheat or whole grain breads, crackers, cereals, and pasta. Unsweetened oatmeal, bulgur, barley, quinoa, or brown rice. Corn or whole wheat flour tortillas. Meats and other proteins Ground beef (85% or leaner), grass-fed beef, or beef trimmed of fat. Skinless chicken or [place]. Ground chicken or [place]. Pork trimmed of fat. All fish and seafood. Egg whites. Dried beans, peas, or lentils. Unsalted nuts or seeds. Unsalted canned beans. Natural nut butters without added sugar and oil. Dairy Low-fat or nonfat dairy products, such as skim or 1% milk, 2% or reduced-fat cheeses, low-fat and fat-free ricotta or cottage cheese, or plain low-fat and nonfat yogurt. Fats and oils Tub margarine without trans fats. Light or reduced-fat mayonnaise and salad dressings. Avocado. Olive, canola, sesame, or safflower oils. The items listed above may not be a complete list of foods and beverages you can eat. Contact  a dietitian for more information. What foods should I avoid? Fruits Canned fruit in heavy syrup. Fruit in cream or butter sauce. Fried fruit. Vegetables Vegetables cooked in cheese, cream, or butter sauce. Fried vegetables. Grains White bread. White pasta. White rice. Cornbread. Bagels, pastries, and croissants. Crackers and snack foods that contain trans fat and hydrogenated oils. Meats and other proteins Fatty cuts of meat. Ribs,  chicken wings, bacon, sausage, bologna, salami, chitterlings, fatback, hot dogs, bratwurst, and packaged lunch meats. Liver and organ meats. Whole eggs and egg yolks. Chicken and [place] with skin. Fried meat. Dairy Whole or 2% milk, cream, half-and-half, and cream cheese. Whole milk cheeses. Whole-fat or sweetened yogurt. Full-fat cheeses. Nondairy creamers and whipped toppings. Processed cheese, cheese spreads, and cheese curds. Fats and oils Butter, stick margarine, lard, shortening, ghee, or bacon fat. Coconut, palm kernel, and palm oils. Beverages Alcohol. Sugar-sweetened drinks such as sodas, lemonade, and fruit drinks. Sweets and desserts Corn syrup, sugars, honey, and molasses. Candy. Jam and jelly. Syrup. Sweetened cereals. Cookies, pies, cakes, donuts, muffins, and ice cream. The items listed above may not be a complete list of foods and beverages you should avoid. Contact a dietitian for more information. Summary Your body needs fat and cholesterol for basic functions. However, eating too much of these things can be harmful to your health. Work with your health care provider and dietitian to follow a diet that limits fat and cholesterol. Doing this may help lower your risk for heart disease and other conditions. Choose healthy fats, such as monounsaturated and polyunsaturated fats, and foods high in omega-3 fatty acids. Eat fiber-rich foods, such as whole grains, beans, peas, fruits, and vegetables. Limit or avoid alcohol, fried foods, and foods high in  saturated fats, partially hydrogenated oils, and sugar. This information is not intended to replace advice given to you by your health care provider. Make sure you discuss any questions you have with your health care provider. Document Revised: 10/16/2020 Document Reviewed: 10/16/2020 Elsevier Patient Education  2022 Elsevier Inc.  American Heart Association [facility]) Exercise Recommendation  Being physically active is important to prevent heart disease and stroke, the nation's No. 1and No. 5killers. To improve overall cardiovascular health, we suggest at least 150 minutes per week of moderate exercise or 75 minutes per week of vigorous exercise (or a combination of moderate and vigorous activity). Thirty minutes a day, five times a week is an easy goal to remember. You will also experience benefits even if you divide your time into two or three segments of 10 to 15 minutes per day.  For people who would benefit from lowering their blood pressure or cholesterol, we recommend 40 minutes of aerobic exercise of moderate to vigorous intensity three to four times a week to lower the risk for heart attack and stroke.  Physical activity is anything that makes you move your body and burn calories.  This includes things like climbing stairs or playing sports. Aerobic exercises benefit your heart, and include walking, jogging, swimming or biking. Strength and stretching exercises are best for overall stamina and flexibility.  The simplest, positive change you can make to effectively improve your heart health is to start walking. It's enjoyable, free, easy, social and great exercise. A walking program is flexible and boasts high success rates because people can stick with it. It's easy for walking to become a regular and satisfying part of life.   For Overall Cardiovascular Health: At least 30 minutes of moderate-intensity aerobic activity at least 5 days per week for a total of 150  OR  At least 25 minutes  of vigorous aerobic activity at least 3 days per week for a total of 75 minutes; or a combination of moderate- and vigorous-intensity aerobic activity  AND  Moderate- to high-intensity muscle-strengthening activity at least 2 days per week for additional health benefits.  For Lowering Blood Pressure and Cholesterol An average  40 minutes of moderate- to vigorous-intensity aerobic activity 3 or 4 times per week  What if I can't make it to the time goal? Something is always better than nothing! And everyone has to start somewhere. Even if you've been sedentary for years, today is the day you can begin to make healthy changes in your life. If you don't think you'll make it for 30 or 40 minutes, set a reachable goal for today. You can work up toward your overall goal by increasing your time as you get stronger. Don't let all-or-nothing thinking rob you of doing what you can every day.  Source:http://www.heart.org    Diabetes Mellitus and Standards of Medical Care Living with and managing diabetes (diabetes mellitus) can be complicated. Your diabetes treatment may be managed by a team of health care providers, including: A physician who specializes in diabetes (endocrinologist). You might also have visits with a nurse practitioner or physician assistant. Nurses. A registered dietitian. A certified diabetes care and education specialist. An exercise specialist. A pharmacist. An eye doctor. A foot specialist (podiatrist). A dental care provider. A primary care provider. A mental health care provider. How to manage your diabetes You can do many things to successfully manage your diabetes. Your health care providers will follow guidelines to help you get the best quality of care. Here are general guidelines for your diabetes management plan. Your health care providers may give you more specific instructions. Physical exams When you are diagnosed with diabetes, and each year after that, your  health care provider will ask about your medical and family history. You will have a physical exam, which may include: Measuring your height, weight, and body mass index (BMI). Checking your blood pressure. This will be done at every routine medical visit. Your target blood pressure may vary depending on your medical conditions, your age, and other factors. A thyroid exam. A skin exam. Screening for nerve damage (peripheral neuropathy). This may include checking the pulse in your legs and feet and the level of sensation in your hands and feet. A foot exam to inspect the structure and skin of your feet, including checking for cuts, bruises, redness, blisters, sores, or other problems. Screening for blood vessel (vascular) problems. This may include checking the pulse in your legs and feet and checking your temperature. Blood tests Depending on your treatment plan and your personal needs, you may have the following tests: Hemoglobin A1C (HbA1C). This test provides information about blood sugar (glucose) control over the previous 2-3 months. It is used to adjust your treatment plan, if needed. This test will be done: At least 2 times a year, if you are meeting your treatment goals. 4 times a year, if you are not meeting your treatment goals or if your goals have changed. Lipid testing, including total cholesterol, LDL and HDL cholesterol, and triglyceride levels. The goal for LDL is less than 100 mg/dL (5.5 mmol/L). If you are at high risk for complications, the goal is less than 70 mg/dL (3.9 mmol/L). The goal for HDL is 40 mg/dL (2.2 mmol/L) or higher for men, and 50 mg/dL (2.8 mmol/L) or higher for women. An HDL cholesterol of 60 mg/dL (3.3 mmol/L) or higher gives some protection against heart disease. The goal for triglycerides is less than 150 mg/dL (8.3 mmol/L). Liver function tests. Kidney function tests. Thyroid function tests.  Dental and eye exams  Visit your dentist two times a  year. If you have type 1 diabetes, your health care provider may recommend  an eye exam within 5 years after you are diagnosed, and then once a year after your first exam. For children with type 1 diabetes, the health care provider may recommend an eye exam when your child is age 35 or older and has had diabetes for 3-5 years. After the first exam, your child should get an eye exam once a year. If you have type 2 diabetes, your health care provider may recommend an eye exam as soon as you are diagnosed, and then every 1-2 years after your first exam. Immunizations A yearly flu (influenza) vaccine is recommended annually for everyone 6 months or older. This is especially important if you have diabetes. The pneumonia (pneumococcal) vaccine is recommended for everyone 2 years or older who has diabetes. If you are age 8 or older, you may get the pneumonia vaccine as a series of two separate shots. The hepatitis B vaccine is recommended for adults shortly after being diagnosed with diabetes. Adults and children with diabetes should receive all other vaccines according to age-specific recommendations from the Centers for Disease Control and Prevention (CDC). Mental and emotional health Screening for symptoms of eating disorders, anxiety, and depression is recommended at the time of diagnosis and after as needed. If your screening shows that you have symptoms, you may need more evaluation. You may work with a mental health care provider. Follow these instructions at home: Treatment plan You will monitor your blood glucose levels and may give yourself insulin. Your treatment plan will be reviewed at every medical visit. You and your health care provider will discuss: How you are taking your medicines, including insulin. Any side effects you have. Your blood glucose level target goals. How often you monitor your blood glucose level. Lifestyle habits, such as activity level and tobacco, alcohol, and  substance use. Education Your health care provider will assess how well you are monitoring your blood glucose levels and whether you are taking your insulin and medicines correctly. He or she may refer you to: A certified diabetes care and education specialist to manage your diabetes throughout your life, starting at diagnosis. A registered dietitian who can create and review your personal nutrition plan. An exercise specialist who can discuss your activity level and exercise plan. General instructions Take over-the-counter and prescription medicines only as told by your health care provider. Keep all follow-up visits. This is important. Where to find support There are many diabetes support networks, including: American Diabetes Association (ADA): diabetes.org Defeat Diabetes Foundation: defeatdiabetes.org Where to find more information American Diabetes Association (ADA): www.diabetes.org Association of Diabetes Care & Education Specialists (ADCES): diabeteseducator.org International Diabetes Federation (IDF): http://hill.biz/ Summary Managing diabetes (diabetes mellitus) can be complicated. Your diabetes treatment may be managed by a team of health care providers. Your health care providers follow guidelines to help you get the best quality care. You should have physical exams, blood tests, blood pressure monitoring, immunizations, and screening tests regularly. Stay updated on how to manage your diabetes. Your health care providers may also give you more specific instructions based on your individual health. This information is not intended to replace advice given to you by your health care provider. Make sure you discuss any questions you have with your health care provider. Document Revised: 12/12/2019 Document Reviewed: 12/12/2019 Elsevier Patient Education  2022 Elsevier Inc.  Diabetes Mellitus and Foot Care Foot care is an important part of your health, especially when you have diabetes.  Diabetes may cause you to have problems because of poor blood flow (  circulation) to your feet and legs, which can cause your skin to: Become thinner and drier. Break more easily. Heal more slowly. Peel and crack. You may also have nerve damage (neuropathy) in your legs and feet, causing decreased feeling in them. This means that you may not notice minor injuries to your feet that could lead to more serious problems. Noticing and addressing any potential problems early is the best way to prevent future foot problems. How to care for your feet Foot hygiene  Wash your feet daily with warm water and mild soap. Do not use hot water. Then, pat your feet and the areas between your toes until they are completely dry. Do not soak your feet as this can dry your skin. Trim your toenails straight across. Do not dig under them or around the cuticle. File the edges of your nails with an emery board or nail file. Apply a moisturizing lotion or petroleum jelly to the skin on your feet and to dry, brittle toenails. Use lotion that does not contain alcohol and is unscented. Do not apply lotion between your toes. Shoes and socks Wear clean socks or stockings every day. Make sure they are not too tight. Do not wear knee-high stockings since they may decrease blood flow to your legs. Wear shoes that fit properly and have enough cushioning. Always look in your shoes before you put them on to be sure there are no objects inside. To break in new shoes, wear them for just a few hours a day. This prevents injuries on your feet. Wounds, scrapes, corns, and calluses  Check your feet daily for blisters, cuts, bruises, sores, and redness. If you cannot see the bottom of your feet, use a mirror or ask someone for help. Do not cut corns or calluses or try to remove them with medicine. If you find a minor scrape, cut, or break in the skin on your feet, keep it and the skin around it clean and dry. You may clean these areas with  mild soap and water. Do not clean the area with peroxide, alcohol, or iodine. If you have a wound, scrape, corn, or callus on your foot, look at it several times a day to make sure it is healing and not infected. Check for: Redness, swelling, or pain. Fluid or blood. Warmth. Pus or a bad smell. General tips Do not cross your legs. This may decrease blood flow to your feet. Do not use heating pads or hot water bottles on your feet. They may burn your skin. If you have lost feeling in your feet or legs, you may not know this is happening until it is too late. Protect your feet from hot and cold by wearing shoes, such as at the beach or on hot pavement. Schedule a complete foot exam at least once a year (annually) or more often if you have foot problems. Report any cuts, sores, or bruises to your health care provider immediately. Where to find more information American Diabetes Association: www.diabetes.org Association of Diabetes Care & Education Specialists: www.diabeteseducator.org Contact a health care provider if: You have a medical condition that increases your risk of infection and you have any cuts, sores, or bruises on your feet. You have an injury that is not healing. You have redness on your legs or feet. You feel burning or tingling in your legs or feet. You have pain or cramps in your legs and feet. Your legs or feet are numb. Your feet always feel cold. You  have pain around any toenails. Get help right away if: You have a wound, scrape, corn, or callus on your foot and: You have pain, swelling, or redness that gets worse. You have fluid or blood coming from the wound, scrape, corn, or callus. Your wound, scrape, corn, or callus feels warm to the touch. You have pus or a bad smell coming from the wound, scrape, corn, or callus. You have a fever. You have a red line going up your leg. Summary Check your feet every day for blisters, cuts, bruises, sores, and redness. Apply a  moisturizing lotion or petroleum jelly to the skin on your feet and to dry, brittle toenails. Wear shoes that fit properly and have enough cushioning. If you have foot problems, report any cuts, sores, or bruises to your health care provider immediately. Schedule a complete foot exam at least once a year (annually) or more often if you have foot problems. This information is not intended to replace advice given to you by your health care provider. Make sure you discuss any questions you have with your health care provider. Document Revised: 12/26/2019 Document Reviewed: 12/26/2019 Elsevier Patient Education  2022 [employer].

## 2021-05-11 NOTE — Progress Notes (Signed)
Established Patient Office Visit  Subjective:  Patient ID: Christine Hopkins, female    DOB: Aug 27, 1955  Age: 65 y.o. MRN: [id number]  CC: No chief complaint on file.   HPI Christine Hopkins presents for CPE  Histories reviewed and updated with patient.   No acute concerns.   Reviewed most recent blood work available with patient.   Hypertension: Patient Currently taking: lisinopril 20mg  po qd.  Good effect. No AEs. Denies CV symptoms including: chest pain, shob, doe, headache, visual changes, fatigue, claudication, and dependent edema.   Previous readings and labs: BP Readings from Last 3 Encounters:  05/11/21 128/76  10/12/20 (!) 136/94  08/03/20 122/74   Lab Results  Component Value Date   CREATININE 0.77 06/01/2020    T2dm Last A1c:  Lab Results  Component Value Date   HGBA1C 6.1 (H) 01/28/2020    Currently taking: no medications at this time, lifestyle control. No new complications  Diet has been steady, healthy.  Exercise habits have been stable  HLD Taking simvastatin 20mg  po qd. Good effect, no AE. Hopes to continue.  Mixed obsessional thoughts/actions, depression, anxiety Taking sertraline 100mg  po qd. Good effect. Feeling stable on this dosage.  Would like to continue.     Past Medical History:  Diagnosis Date   Allergy    Anxiety    Arthritis    Depression    Diabetes mellitus without complication (HCC)    GERD (gastroesophageal reflux disease)    History of back surgery    Hyperlipidemia    Hypertension    Mild sleep apnea    Neuromuscular disorder (HCC)    OCD (obsessive compulsive disorder) 04/05/2019    Past Surgical History:  Procedure Laterality Date   ESOPHAGEAL MANOMETRY N/A 07/13/2020   Procedure: ESOPHAGEAL MANOMETRY (EM);  Surgeon: , MD;  Location: [facility];  Service: Endoscopy;  Laterality: N/A;   LUMBAR DISC SURGERY     ORIF ANKLE FRACTURE Right    and tibia    Family History  Problem Relation  Age of Onset   Depression Father    Mental illness Sister    Depression Sister    Schizophrenia Sister    Diabetes Brother    Hyperlipidemia Brother    Mental illness Brother    Depression Brother    Schizophrenia Brother    Heart disease Brother    Diabetes Brother    Diabetes Maternal Grandmother    Heart disease Maternal Grandfather    Heart disease Paternal Grandmother    Colon polyps Neg Hx    Prostate cancer Neg Hx    Rectal cancer Neg Hx    Stomach cancer Neg Hx    Pancreatic cancer Neg Hx     Social History   Socioeconomic History   Marital status: Single    Spouse name: Not on file   Number of children: 1   Years of education: Not on file   Highest education level: Not on file  Occupational History   Occupation: Disabled  Tobacco Use   Smoking status: Never   Smokeless tobacco: Never  Vaping Use   Vaping Use: Never used  Substance and Sexual Activity   Alcohol use: No   Drug use: No   Sexual activity: Not Currently  Other Topics Concern   Not on file  Social History Narrative   Not on file   Social Determinants of Health   Financial Resource Strain: Not on file  Food Insecurity: Not on file  Transportation  Needs: Not on file  Physical Activity: Not on file  Stress: Not on file  Social Connections: Not on file  Intimate Partner Violence: Not on file    Outpatient Medications Prior to Visit  Medication Sig Dispense Refill   allopurinol (ZYLOPRIM) 300 MG tablet Take 1 tablet (300 mg total) by mouth daily. 90 tablet 1   aspirin EC 81 MG tablet Take 325 mg by mouth daily. Swallow whole.     cephALEXin (KEFLEX) 500 MG capsule Take 1 capsule (500 mg total) by mouth 4 (four) times daily. 4 capsule 0   colchicine 0.6 MG tablet Take 1 tablet bid for the 5 days for gout attack (Patient taking differently: as needed. Take as needed for gout attack) 10 tablet 0   ibuprofen (ADVIL,MOTRIN) 800 MG tablet Take 1 tablet (800 mg total) by mouth 3 (three) times  daily. (Patient taking differently: Take 800 mg by mouth as needed.) 90 tablet 0   indomethacin (INDOCIN) 50 MG capsule Take 1 capsule (50 mg total) by mouth 3 (three) times daily as needed. 90 capsule 1   lisinopril (ZESTRIL) 20 MG tablet TAKE 1 TABLET BY MOUTH  DAILY 30 tablet 0   meloxicam (MOBIC) 7.5 MG tablet Take 1 tablet (7.5 mg total) by mouth daily. (Patient taking differently: Take 7.5 mg by mouth as needed.) 30 tablet 0   metFORMIN (GLUCOPHAGE) 1000 MG tablet Take 1 tablet (1,000 mg total) by mouth 2 (two) times daily with a meal. (Patient not taking: Reported on 10/12/2020) 180 tablet 3   omeprazole (PRILOSEC) 20 MG capsule Take 20 mg by mouth daily.     omeprazole (PRILOSEC) 40 MG capsule Take 1 capsule (40 mg total) by mouth daily. (Patient not taking: Reported on 10/12/2020) 30 capsule 3   ondansetron (ZOFRAN) 8 MG tablet Take 1 tablet (8 mg total) by mouth every 8 (eight) hours as needed for nausea or vomiting. (Patient not taking: Reported on 10/12/2020) 20 tablet 0   sertraline (ZOLOFT) 100 MG tablet Take 1 tablet (100 mg total) by mouth daily. 90 tablet 3   simvastatin (ZOCOR) 40 MG tablet TAKE 1 TABLET BY MOUTH  DAILY 30 tablet 0   No facility-administered medications prior to visit.    No Active Allergies  ROS Review of Systems  Constitutional: Negative.   HENT: Negative.    Eyes: Negative.   Respiratory: Negative.    Cardiovascular: Negative.   Gastrointestinal: Negative.   Genitourinary: Negative.   Musculoskeletal: Negative.   Skin: Negative.   Neurological: Negative.   Psychiatric/Behavioral: Negative.    All other systems reviewed and are negative.    Objective:    Physical Exam Vitals and nursing note reviewed.  Constitutional:      General: She is not in acute distress.    Appearance: Normal appearance. She is normal weight. She is not ill-appearing, toxic-appearing or diaphoretic.  HENT:     Head: Normocephalic and atraumatic.     Right Ear: Tympanic  membrane, ear canal and external ear normal. There is no impacted cerumen.     Left Ear: Tympanic membrane, ear canal and external ear normal. There is no impacted cerumen.     Nose: Nose normal. No congestion or rhinorrhea.     Mouth/Throat:     Mouth: Mucous membranes are moist.     Pharynx: Oropharynx is clear. No oropharyngeal exudate or posterior oropharyngeal erythema.  Eyes:     General: No scleral icterus.       Right eye:  No discharge.        Left eye: No discharge.     Extraocular Movements: Extraocular movements intact.     Conjunctiva/sclera: Conjunctivae normal.     Pupils: Pupils are equal, round, and reactive to light.  Cardiovascular:     Rate and Rhythm: Normal rate and regular rhythm.     Pulses: Normal pulses.     Heart sounds: Normal heart sounds. No murmur heard.   No friction rub. No gallop.  Pulmonary:     Effort: Pulmonary effort is normal. No respiratory distress.     Breath sounds: Normal breath sounds. No stridor. No wheezing, rhonchi or rales.  Chest:     Chest wall: No tenderness.  Abdominal:     General: Abdomen is flat. Bowel sounds are normal. There is no distension.     Palpations: Abdomen is soft. There is no mass.     Tenderness: There is no abdominal tenderness. There is no right CVA tenderness, left CVA tenderness, guarding or rebound.     Hernia: No hernia is present.  Musculoskeletal:        General: No swelling, tenderness, deformity or signs of injury. Normal range of motion.     Right lower leg: No edema.     Left lower leg: No edema.  Skin:    General: Skin is warm and dry.     Capillary Refill: Capillary refill takes less than 2 seconds.     Coloration: Skin is not jaundiced or pale.     Findings: No bruising, erythema, lesion or rash.  Neurological:     General: No focal deficit present.     Mental Status: She is alert and oriented to person, place, and time. Mental status is at baseline.     Cranial Nerves: No cranial nerve  deficit.     Sensory: No sensory deficit.     Motor: No weakness.     Coordination: Coordination normal.     Gait: Gait normal.     Deep Tendon Reflexes: Reflexes normal.  Psychiatric:        Mood and Affect: Mood normal.        Behavior: Behavior normal.        Thought Content: Thought content normal.        Judgment: Judgment normal.    There were no vitals taken for this visit. Wt Readings from Last 3 Encounters:  10/12/20 218 lb 9.6 oz (99.2 kg)  08/03/20 219 lb (99.3 kg)  06/03/20 212 lb (96.2 kg)     Health Maintenance Due  Topic Date Due   OPHTHALMOLOGY EXAM  Never done   Zoster Vaccines- Shingrix (1 of 2) Never done   COLONOSCOPY (Pts 45-48yrs Insurance coverage will need to be confirmed)  Never done   MAMMOGRAM  Never done   COVID-19 Vaccine (2 - Pfizer risk series) 01/27/2020   Pneumonia Vaccine 16+ Years old (2 - PCV) 07/02/2020   FOOT EXAM  07/02/2020   HEMOGLOBIN A1C  07/30/2020   DEXA SCAN  Never done   PAP SMEAR-Modifier  09/13/2020    There are no preventive care reminders to display for this patient.  Lab Results  Component Value Date   TSH 1.820 07/03/2019   Lab Results  Component Value Date   WBC 8.7 06/01/2020   HGB 12.6 06/01/2020   HCT 37.9 06/01/2020   MCV 84.4 06/01/2020   PLT 289.0 06/01/2020   Lab Results  Component Value Date   NA 143 06/01/2020  K 4.2 06/01/2020   CO2 30 06/01/2020   GLUCOSE 85 06/01/2020   BUN 15 06/01/2020   CREATININE 0.77 06/01/2020   BILITOT 0.4 06/01/2020   ALKPHOS 73 06/01/2020   AST 13 06/01/2020   ALT 8 06/01/2020   PROT 7.2 06/01/2020   ALBUMIN 4.3 06/01/2020   CALCIUM 9.3 06/01/2020   ANIONGAP 7 07/26/2019   GFR 81.28 06/01/2020   Lab Results  Component Value Date   CHOL 199 01/28/2020   Lab Results  Component Value Date   HDL 68 01/28/2020   Lab Results  Component Value Date   LDLCALC 105 (H) 01/28/2020   Lab Results  Component Value Date   TRIG 151 (H) 01/28/2020   Lab Results   Component Value Date   CHOLHDL 2.9 01/28/2020   Lab Results  Component Value Date   HGBA1C 6.1 (H) 01/28/2020      Assessment & Plan:   Problem List Items Addressed This Visit   None   No orders of the defined types were placed in this encounter.   Follow-up: No follow-ups on file.   PLAN  Janeece Agee, NP

## 2021-05-23 ENCOUNTER — Encounter: Payer: Self-pay | Admitting: Pharmacist

## 2021-05-26 ENCOUNTER — Telehealth: Payer: Self-pay

## 2021-05-26 NOTE — Telephone Encounter (Signed)
Referral has been cancelled at pt's request/

## 2021-05-26 NOTE — Telephone Encounter (Signed)
Caller name:Elizabet Janee Morn    On DPR? :No  Call back number:[phone]  Provider they see: Gerlene Burdock  Reason for call:Pt is calling letting Alcario Drought know she no longer needs the referral to  OPHTHALMOLOGY she is going tomorrow to a old eye Dr that she used to see

## 2021-05-27 IMAGING — CT CT HEART MORP W/ CTA COR W/ SCORE W/ CA W/CM &/OR W/O CM
4 of 7 series · 8 of 20 positions shown, 9 images · IV contrast (APPLIED)
Comparison: None.
COMPARISON: None.

Addendum:
EXAM:
OVER-READ INTERPRETATION  CT CHEST

The following report is an over-read performed by radiologist Dr.
Reese Tiger [REDACTED] on 05/27/2020. This
over-read does not include interpretation of cardiac or coronary
anatomy or pathology. The coronary calcium score/coronary CTA
interpretation by the cardiologist is attached.
CLINICAL DATA: Chest pain
Cardiac CTA
MEDICATIONS:
Sub lingual nitro. 4mg x 2
TECHNIQUE: The patient was scanned on a Siemens [REDACTED]ice scanner. Gantry
rotation speed was 250 msecs. Collimation was 0.6 mm. A 100 kV
prospective scan was triggered in the ascending thoracic aorta at
35-75% of the R-R interval. Average HR during the scan was 60 bpm.
The 3D data set was interpreted on a dedicated work station using
MPR, MIP and VRT modes. A total of 80cc of contrast was used.

[Series 6: best diast 73 % · axial · 0.39mm/px · z∈[+1202,+1243]mm · 2 of 308 slices shown, 3 images]
[im 103/308  vessel]
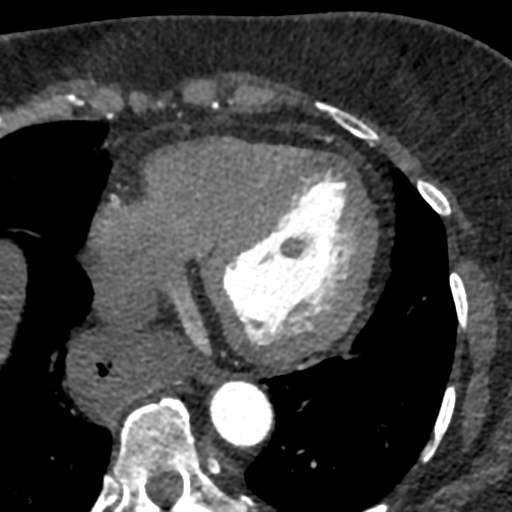
[im 103/308  lung]
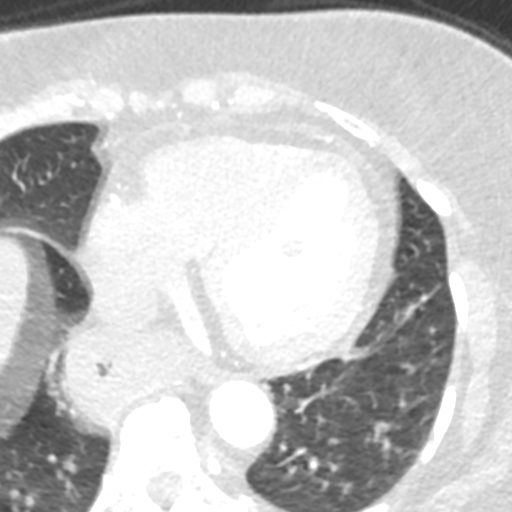
[im 205/308  vessel]
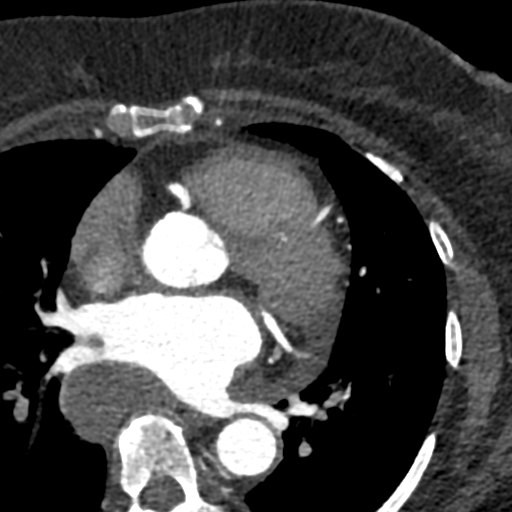

[Series 7: best syst 31 % · axial · 0.39mm/px · z∈[+1202,+1243]mm · 2 of 308 slices shown]
[im 103/308  vessel]
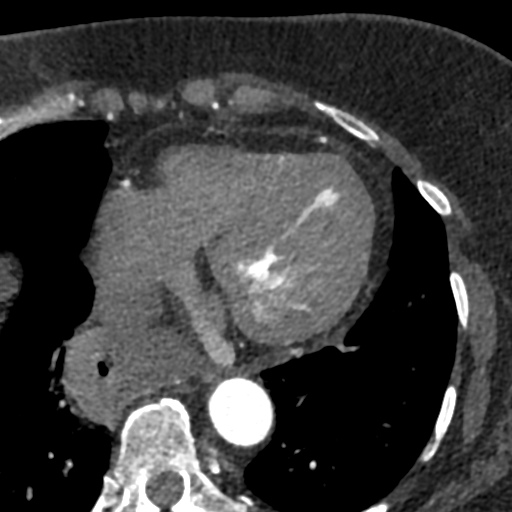
[im 205/308  vessel]
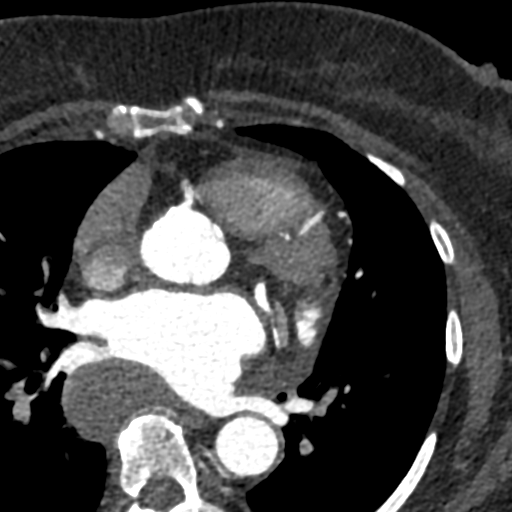

[Series 8: ts diast sharp 73 % · axial · 0.39mm/px · z∈[+1202,+1243]mm · 2 of 308 slices shown]
[im 103/308  lung]
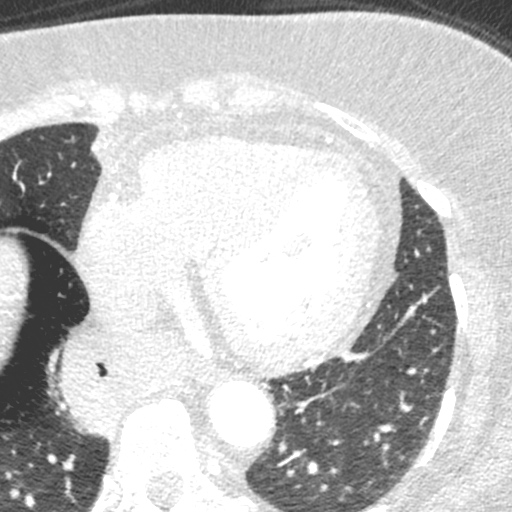
[im 205/308  lung]
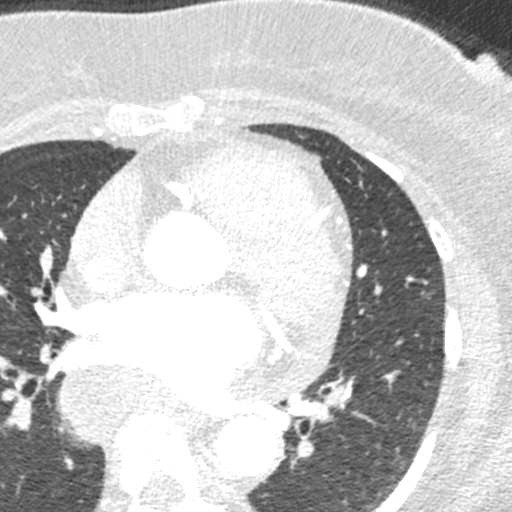

[Series 9: ts syst sharp 31 % · axial · 0.39mm/px · z∈[+1202,+1243]mm · 2 of 308 slices shown]
[im 103/308  lung]
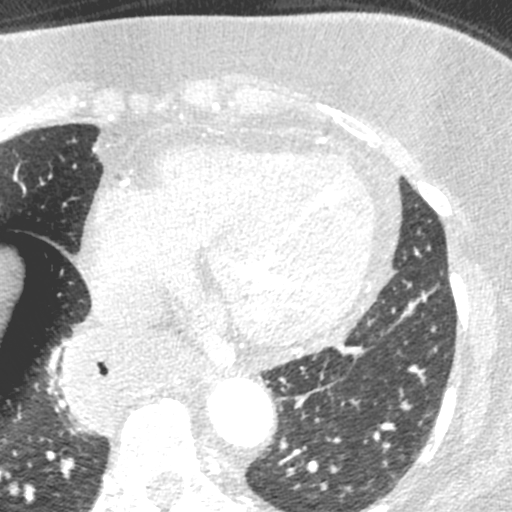
[im 205/308  lung]
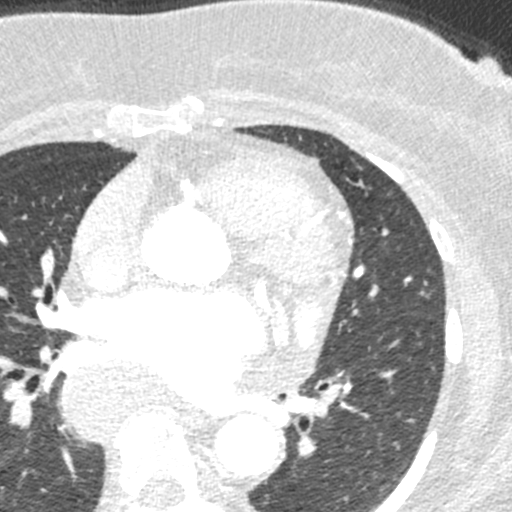

[8 of 20 positions shown; findings below may reference images not displayed]

FINDINGS: Aortic atherosclerosis. Mass-like circumferential thickening of the
middle and distal esophagus. Densely calcified right hilar lymph
nodes are incidentally noted. Within the visualized portions of the
thorax there are no suspicious appearing pulmonary nodules or
masses, there is no acute consolidative airspace disease, no pleural
effusions, no pneumothorax and no lymphadenopathy. Visualized
portions of the upper abdomen are unremarkable. There are no
aggressive appearing lytic or blastic lesions noted in the
visualized portions of the skeleton.
IMPRESSION: 1. Mass-like thickening of the middle and distal third of the
esophagus, concerning for potential esophageal neoplasm. Further
evaluation with endoscopy is strongly recommended in the near future
to better evaluate these findings.
2.  Aortic Atherosclerosis (S9PB5-7CT.T).

These results will be called to the ordering clinician or
representative by the Radiologist Assistant, and communication
documented in the PACS or [REDACTED].
FINDINGS: (esophageal thickening with need for followup).

Pulmonary veins drain normally to the left atrium. No LA appendage
thrombus noted.

Calcium Score: 1.45 Agatston units.

Coronary Arteries: Right dominant with no anomalies

LM: No plaque or stenosis.

LAD system: Mixed plaque proximal LAD, mild (<50%) stenosis. There
was an area of myocardial bridging in the mid LAD.

Circumflex system: No plaque or stenosis.

RCA system: No plaque or stenosis. In the best diastolic phase, I
was concerned for a small dissection and narrowing at the ostial
RCA, however, this was not seen on the best systolic phase and was
most likely artifactual.
IMPRESSION: 1. Coronary artery calcium score 1.45 Agatston units. This places
the patient in the 65th percentile for age and gender, suggesting
intermediate risk for future cardiac events.

2.  Mild nonobstructive CAD.

Nazareth Jumper

*** End of Addendum ***
EXAM:
OVER-READ INTERPRETATION  CT CHEST

The following report is an over-read performed by radiologist Dr.
Reese Tiger [REDACTED] on 05/27/2020. This
over-read does not include interpretation of cardiac or coronary
anatomy or pathology. The coronary calcium score/coronary CTA
interpretation by the cardiologist is attached.
FINDINGS: Aortic atherosclerosis. Mass-like circumferential thickening of the
middle and distal esophagus. Densely calcified right hilar lymph
nodes are incidentally noted. Within the visualized portions of the
thorax there are no suspicious appearing pulmonary nodules or
masses, there is no acute consolidative airspace disease, no pleural
effusions, no pneumothorax and no lymphadenopathy. Visualized
portions of the upper abdomen are unremarkable. There are no
aggressive appearing lytic or blastic lesions noted in the
visualized portions of the skeleton.
IMPRESSION: 1. Mass-like thickening of the middle and distal third of the
esophagus, concerning for potential esophageal neoplasm. Further
evaluation with endoscopy is strongly recommended in the near future
to better evaluate these findings.
2.  Aortic Atherosclerosis (S9PB5-7CT.T).

These results will be called to the ordering clinician or
representative by the Radiologist Assistant, and communication
documented in the PACS or [REDACTED].

## 2021-05-31 ENCOUNTER — Encounter: Payer: Self-pay | Admitting: Podiatry

## 2021-05-31 ENCOUNTER — Encounter: Payer: Self-pay | Admitting: Cardiology

## 2021-05-31 ENCOUNTER — Ambulatory Visit (INDEPENDENT_AMBULATORY_CARE_PROVIDER_SITE_OTHER): Payer: Medicare Other | Admitting: Podiatry

## 2021-05-31 ENCOUNTER — Other Ambulatory Visit: Payer: Self-pay

## 2021-05-31 ENCOUNTER — Ambulatory Visit (INDEPENDENT_AMBULATORY_CARE_PROVIDER_SITE_OTHER): Payer: Medicare Other | Admitting: Cardiology

## 2021-05-31 VITALS — BP 134/88 | HR 74 | Ht 64.0 in | Wt 197.4 lb

## 2021-05-31 DIAGNOSIS — B353 Tinea pedis: Secondary | ICD-10-CM | POA: Diagnosis not present

## 2021-05-31 DIAGNOSIS — E1165 Type 2 diabetes mellitus with hyperglycemia: Secondary | ICD-10-CM | POA: Diagnosis not present

## 2021-05-31 DIAGNOSIS — E7800 Pure hypercholesterolemia, unspecified: Secondary | ICD-10-CM | POA: Diagnosis not present

## 2021-05-31 DIAGNOSIS — I2510 Atherosclerotic heart disease of native coronary artery without angina pectoris: Secondary | ICD-10-CM | POA: Diagnosis not present

## 2021-05-31 DIAGNOSIS — I10 Essential (primary) hypertension: Secondary | ICD-10-CM | POA: Diagnosis not present

## 2021-05-31 DIAGNOSIS — E669 Obesity, unspecified: Secondary | ICD-10-CM

## 2021-05-31 DIAGNOSIS — G4733 Obstructive sleep apnea (adult) (pediatric): Secondary | ICD-10-CM

## 2021-05-31 MED ORDER — KETOCONAZOLE 2 % EX CREA: 1.0000 "application " | TOPICAL_CREAM | Freq: Every day | CUTANEOUS | 2 refills | Status: DC

## 2021-05-31 MED ORDER — ROSUVASTATIN CALCIUM 20 MG PO TABS: 20.0000 mg | ORAL_TABLET | Freq: Every day | ORAL | 3 refills | Status: DC

## 2021-05-31 NOTE — Progress Notes (Signed)
Cardiology Office Note:    Date:  05/31/2021   ID:  Christine Hopkins, DOB 1956/03/04, MRN [id number]  PCP:  Janeece Agee, NP   [facility]  Cardiologist:  Meriam Sprague, MD  Advanced Practice Provider:  No care team member to display Electrophysiologist:  None   Referring MD: Janeece Agee, NP    History of Present Illness:    Christine Hopkins is a 65 y.o. female with a hx of anxiety, depression, DMII, HLD, and HTN who returns to clinic for follow-up.     Seen in clinic on 04/14/20 for chest pain. Fortunately, CTA coronaries with mild non-obstructive disease. TTE with normal BiV function. Notably on CTA, the patient had severely dilated esophagus and was referred to GI for further management. Work-up was reassuring and she was started on omeprazole and peppermint oil.  Last seen in clinic on 08/03/20 where she was doing well. Was trying to lose weight and increase her walking.  Today, the patient is doing very well. She has lost over 25lbs on ozempic. Her esophageal spasms are significantly improved. No chest pain, SOB, nausea, vomiting. Blood pressures well controlled at home on lisinopril. [facility] [place] and does the elliptical.   Past Medical History:  Diagnosis Date   Allergy    Anxiety    Arthritis    Depression    Diabetes mellitus without complication (HCC)    GERD (gastroesophageal reflux disease)    History of back surgery    Hyperlipidemia    Hypertension    Mild sleep apnea    Neuromuscular disorder (HCC)    OCD (obsessive compulsive disorder) 04/05/2019    Past Surgical History:  Procedure Laterality Date   ESOPHAGEAL MANOMETRY N/A 07/13/2020   Procedure: ESOPHAGEAL MANOMETRY (EM);  Surgeon: Rachael Fee, MD;  Location: [facility];  Service: Endoscopy;  Laterality: N/A;   LUMBAR DISC SURGERY     ORIF ANKLE FRACTURE Right    and tibia    Current Medications: Current Meds  Medication Sig    allopurinol (ZYLOPRIM) 300 MG tablet Take 1 tablet (300 mg total) by mouth daily.   aspirin EC 81 MG tablet Take 325 mg by mouth daily. Swallow whole.   colchicine 0.6 MG tablet Take 1 tablet (0.6 mg total) by mouth 2 (two) times daily as needed. Take as needed for gout attack   ibuprofen (ADVIL) 800 MG tablet Take 1 tablet (800 mg total) by mouth 2 (two) times daily as needed.   indomethacin (INDOCIN) 50 MG capsule Take 1 capsule (50 mg total) by mouth 3 (three) times daily as needed.   lisinopril (ZESTRIL) 20 MG tablet Take 1 tablet (20 mg total) by mouth daily.   omeprazole (PRILOSEC) 40 MG capsule Take 1 capsule (40 mg total) by mouth daily.   rosuvastatin (CRESTOR) 20 MG tablet Take 1 tablet (20 mg total) by mouth daily.   sertraline (ZOLOFT) 100 MG tablet Take 1 tablet (100 mg total) by mouth daily.   [DISCONTINUED] simvastatin (ZOCOR) 40 MG tablet Take 1 tablet (40 mg total) by mouth daily.     Allergies:   Patient has no active allergies.   Social History   Socioeconomic History   Marital status: Single    Spouse name: Not on file   Number of children: 1   Years of education: Not on file   Highest education level: Not on file  Occupational History   Occupation: Disabled  Tobacco Use  Smoking status: Never   Smokeless tobacco: Never  Vaping Use   Vaping Use: Never used  Substance and Sexual Activity   Alcohol use: No   Drug use: No   Sexual activity: Not Currently  Other Topics Concern   Not on file  Social History Narrative   Not on file   Social Determinants of Health   Financial Resource Strain: Not on file  Food Insecurity: Not on file  Transportation Needs: Not on file  Physical Activity: Not on file  Stress: Not on file  Social Connections: Not on file     Family History: The patient's family history includes Depression in her brother, father, and sister; Diabetes in her brother, brother, and maternal grandmother; Heart disease in her brother, maternal  grandfather, and paternal grandmother; Hyperlipidemia in her brother; Mental illness in her brother and sister; Schizophrenia in her brother and sister. There is no history of Colon polyps, Prostate cancer, Rectal cancer, Stomach cancer, or Pancreatic cancer.  ROS:   Please see the history of present illness.    Review of Systems  Constitutional:  Negative for chills and fever.  Eyes:  Negative for blurred vision.  Respiratory:  Negative for shortness of breath.   Cardiovascular:  Negative for chest pain, palpitations, orthopnea, claudication, leg swelling and PND.  Gastrointestinal:  Negative for heartburn and melena.  Genitourinary:  Negative for hematuria.  Musculoskeletal:  Positive for joint pain. Negative for falls.  Neurological:  Negative for dizziness and loss of consciousness.  Endo/Heme/Allergies:  Negative for polydipsia.   EKGs/Labs/Other Studies Reviewed:    The following studies were reviewed today: TTE 2020-05-13: 1. Left ventricular ejection fraction, by estimation, is 60 to 65%. The  left ventricle has normal function. The left ventricle has no regional  wall motion abnormalities. Left ventricular diastolic parameters are  consistent with Grade II diastolic  dysfunction (pseudonormalization).   2. Right ventricular systolic function is normal. The right ventricular  size is normal. There is normal pulmonary artery systolic pressure.   3. Left atrial size was mildly dilated.   4. The mitral valve is normal in structure. Mild mitral valve  regurgitation. No evidence of mitral stenosis.   5. The aortic valve is normal in structure. Aortic valve regurgitation is  not visualized. No aortic stenosis is present.   6. The inferior vena cava is normal in size with greater than 50%  respiratory variability, suggesting right atrial pressure of 3 mmHg.    Coronary CTA 05/27/20: IMPRESSION: 1. Coronary artery calcium score 1.45 Agatston units. This places the patient in the  65th percentile for age and gender, suggesting intermediate risk for future cardiac events.   2.  Mild nonobstructive CAD.   07/26/19 Lower extremity venous ultrasound:  FINDINGS: VENOUS  Normal compressibility of the common femoral, superficial femoral, and popliteal veins, as well as the visualized calf veins. Visualized portions of profunda femoral vein and great saphenous vein unremarkable. No filling defects to suggest DVT on grayscale or color Doppler imaging. Doppler waveforms show normal direction of venous flow, normal respiratory phasicity and response to augmentation.   Limited views of the contralateral common femoral vein are unremarkable.   OTHER   None.   Limitations: none   IMPRESSION: No femoropopliteal DVT nor evidence of DVT within the visualized calf veins.   If clinical symptoms are inconsistent or if there are persistent or worsening symptoms, further imaging (possibly involving the iliac veins) may be warranted.   CTA chest 11/2019: FINDINGS:  LOWER  NECK AND AXILLA: There is no lymphadenopathy.  The thyroid gland appears normal.   HEART AND VESSELS: Heart size appears enlarged.  There is no pericardial effusion.  Scattered coronary artery calcifications are identified.  Scattered aortic calcifications are present.  The aorta and great vessels are normal in caliber.    The main pulmonary artery is normal in caliber. There are no central filling defects to suggest central pulmonary embolism.   MEDIASTINUM AND HILA: There is no lymphadenopathy or mass.  Benign calcified lymph nodes are present.  The esophagus is markedly dilated and thick walled.   LUNG AND AIRWAYS: No focal airspace consolidation is seen.  Minimal bibasilar dependent atelectasis is identified.  The central airways are patent. There is no bronchiectasis or honeycombing.  No suspicious nodule or mass.   PLEURA:  There is no pneumothorax or pleural effusion.   BONES AND CHEST WALL: Soft  tissue structures appear grossly normal. Osseous structures appear intact. There is multilevel vertebral body spurring.   UPPER ABDOMEN:  Visualized upper abdominal structures appear grossly normal.   IMPRESSION:  1. No evidence of acute cardiopulmonary abnormality.  No pulmonary embolus or focal airspace disease.  2. Cardiomegaly.  3. Markedly dilated thick-walled esophagus.    ECG today 05/31/21: NSR with HR 74   Recent Labs: 05/11/2021: ALT 11; BUN 11; Creatinine, Ser 0.79; Hemoglobin 12.5; Platelets 252.0; Potassium 3.9; Sodium 146; TSH 1.55  Recent Lipid Panel    Component Value Date/Time   CHOL 197 05/11/2021 0908   CHOL 199 01/28/2020 1611   TRIG 138.0 05/11/2021 0908   HDL 54.20 05/11/2021 0908   HDL 68 01/28/2020 1611   CHOLHDL 4 05/11/2021 0908   VLDL 27.6 05/11/2021 0908   LDLCALC 115 (H) 05/11/2021 0908   LDLCALC 105 (H) 01/28/2020 1611     Physical Exam:    VS:  BP 134/88   Pulse 74   Ht 5\' 4"  (1.626 m)   Wt 197 lb 6.4 oz (89.5 kg)   SpO2 99%   BMI 33.88 kg/m     Wt Readings from Last 3 Encounters:  05/31/21 197 lb 6.4 oz (89.5 kg)  05/11/21 199 lb 3.2 oz (90.4 kg)  10/12/20 218 lb 9.6 oz (99.2 kg)     GEN:  Well nourished, well developed in no acute distress HEENT: Normal NECK: No JVD; No carotid bruits CARDIAC: RRR, no murmurs RESPIRATORY:  CTAB, no wheezing ABDOMEN: Soft, non-tender, non-distended MUSCULOSKELETAL:  No edema; No deformity  SKIN: Warm and dry NEUROLOGIC:  Alert and oriented x 3 PSYCHIATRIC:  Normal affect   ASSESSMENT:    1. Coronary artery disease involving native coronary artery of native heart without angina pectoris   2. Pure hypercholesterolemia   3. Essential hypertension   4. Type 2 diabetes mellitus with hyperglycemia, without long-term current use of insulin (HCC)   5. OSA (obstructive sleep apnea)   6. Obesity (BMI 30-39.9)    PLAN:    In order of problems listed above:  #Non-obstructive coronary artery  disease: #Coronary calcifications: Patient with episode of neck, arm, jaw pain that occurred at rest while visiting [place]. Went to OSH ED where work-up was reportedly normal. Coronary CTA here with mild LAD disease. TTE with normal BiV function. Likely symptoms driving by esophageal spasm as detailed below. -Coronary CTA with mild disease -Continue ASA 81mg  daily -Change simva to crestor 20mg  daily  #Esophageal Spasm: Coronary CT showed severely dilated esophagus. Fortunately, seen by GI and is not malignant in nature.  Thought to be due to esophageal spasm. Now on PPI and peppermint oil with improvement. -Follow-up with GI as scheduled   #Hypertension: Well controlled and at goal <120/80s. -Lisinopril 20mg    #Daytime somnolence: #Snoring: -Followed by Dr. Radford Pax -Recommended for CPAP   #HLD LDL 115. Goal <70. -Change simva to crestor 20mg  daily  #Obesity with BMI 33: Doing very well with weight loss. Continues to be active and adherent with ozempic. -Continue ozempic -Continue diet and exercise  Exercise recommendations: Goal of exercising for at least 30 minutes a day, at least 5 times per week.  Please exercise to a moderate exertion.  This means that while exercising it is difficult to speak in full sentences, however you are not so short of breath that you feel you must stop, and not so comfortable that you can carry on a full conversation.  Exertion level should be approximately a 5/10, if 10 is the most exertion you can perform.  Diet recommendations: Recommend a heart healthy diet such as the Mediterranean diet.  This diet consists of plant based foods, healthy fats, lean meats, olive oil.  It suggests limiting the intake of simple carbohydrates such as white breads, pastries, and pastas.  It also limits the amount of red meat, wine, and dairy products such as cheese that one should consume on a daily basis.      Medication Adjustments/Labs and Tests Ordered: Current  medicines are reviewed at length with the patient today.  Concerns regarding medicines are outlined above.  Orders Placed This Encounter  Procedures   EKG 12-Lead   Meds ordered this encounter  Medications   rosuvastatin (CRESTOR) 20 MG tablet    Sig: Take 1 tablet (20 mg total) by mouth daily.    Dispense:  90 tablet    Refill:  3    Patient Instructions  Medication Instructions:   STOP TAKING SIMVASTATIN NOW  START TAKING ROSUVASTATIN (CRESTOR) 20 MG BY MOUTH DAILY  *If you need a refill on your cardiac medications before your next appointment, please call your pharmacy*   Follow-Up: At [facility], you and your health needs are our priority.  As part of our continuing mission to provide you with exceptional heart care, we have created designated Provider Care Teams.  These Care Teams include your primary Cardiologist (physician) and Advanced Practice Providers (APPs -  Physician Assistants and Nurse Practitioners) who all work together to provide you with the care you need, when you need it.  We recommend signing up for the patient portal called "MyChart".  Sign up information is provided on this After Visit Summary.  MyChart is used to connect with patients for Virtual Visits (Telemedicine).  Patients are able to view lab/test results, encounter notes, upcoming appointments, etc.  Non-urgent messages can be sent to your provider as well.   To learn more about what you can do with MyChart, go to [email or web address].    Your next appointment:   1 year(s)  The format for your next appointment:   In Person  Provider:   Freada Bergeron, MD { I    Signed, Freada Bergeron, MD  05/31/2021 8:50 AM    [place]

## 2021-05-31 NOTE — Progress Notes (Signed)
  Subjective:  Patient ID: Christine Hopkins, female    DOB: June 25, 1955,   MRN: [id number]  Chief Complaint  Patient presents with   Nail Problem    Diabetic foot exam     65 y.o. female presents for diabetic foot exam. Denies any pain in her feet. Denies burning or tingling. Does relates occasional itchiness in her feet and is concerned about athletes foot. Last A1c was 6.  . Denies any other pedal complaints. Denies n/v/f/c.   PCP: Janeece Agee MD   Past Medical History:  Diagnosis Date   Allergy    Anxiety    Arthritis    Depression    Diabetes mellitus without complication (HCC)    GERD (gastroesophageal reflux disease)    History of back surgery    Hyperlipidemia    Hypertension    Mild sleep apnea    Neuromuscular disorder (HCC)    OCD (obsessive compulsive disorder) 04/05/2019    Objective:  Physical Exam: Vascular: DP/PT pulses 2/4 bilateral. CFT <3 seconds. Normal hair growth on digits. No edema.  Skin. No lacerations or abrasions bilateral feet. Nails 1-5 bilateral normal in appearance. Some flaking noted to plantar feet.  Musculoskeletal: MMT 5/5 bilateral lower extremities in DF, PF, Inversion and Eversion. Deceased ROM in DF of ankle joint.  Neurological: Sensation intact to light touch. Protective sensation intact.   Assessment:   1. Type 2 diabetes mellitus with hyperglycemia, without long-term current use of insulin (HCC)   2. Tinea pedis of both feet      Plan:  Patient was evaluated and treated and all questions answered. -Discussed and educated patient on diabetic foot care, especially with  regards to the vascular, neurological and musculoskeletal systems.  -Stressed the importance of good glycemic control and the detriment of not  controlling glucose levels in relation to the foot. -Discussed supportive shoes at all times and checking feet regularly.  -Prescription for ketoconazole provided.  -Answered all patient questions -Patient to return   in 1 year for diabetic foot exam.  -Patient advised to call the office if any problems or questions arise in the meantime.   Louann Sjogren, DPM

## 2021-05-31 NOTE — Patient Instructions (Addendum)
Medication Instructions:   STOP TAKING SIMVASTATIN NOW  START TAKING ROSUVASTATIN (CRESTOR) 20 MG BY MOUTH DAILY  *If you need a refill on your cardiac medications before your next appointment, please call your pharmacy*   Follow-Up: At [facility], you and your health needs are our priority.  As part of our continuing mission to provide you with exceptional heart care, we have created designated Provider Care Teams.  These Care Teams include your primary Cardiologist (physician) and Advanced Practice Providers (APPs -  Physician Assistants and Nurse Practitioners) who all work together to provide you with the care you need, when you need it.  We recommend signing up for the patient portal called "MyChart".  Sign up information is provided on this After Visit Summary.  MyChart is used to connect with patients for Virtual Visits (Telemedicine).  Patients are able to view lab/test results, encounter notes, upcoming appointments, etc.  Non-urgent messages can be sent to your provider as well.   To learn more about what you can do with MyChart, go to [email or web address].    Your next appointment:   1 year(s)  The format for your next appointment:   In Person  Provider:   Meriam Sprague, MD { I

## 2021-06-01 ENCOUNTER — Encounter: Payer: Self-pay | Admitting: Pharmacist

## 2021-06-10 ENCOUNTER — Other Ambulatory Visit: Payer: Self-pay | Admitting: *Deleted

## 2021-06-10 DIAGNOSIS — M109 Gout, unspecified: Secondary | ICD-10-CM

## 2021-06-10 MED ORDER — ALLOPURINOL 300 MG PO TABS: 300.0000 mg | ORAL_TABLET | Freq: Every day | ORAL | 3 refills | Status: DC

## 2021-06-24 ENCOUNTER — Other Ambulatory Visit: Payer: Self-pay | Admitting: Registered Nurse

## 2021-06-24 DIAGNOSIS — F4321 Adjustment disorder with depressed mood: Secondary | ICD-10-CM

## 2021-07-07 ENCOUNTER — Encounter: Payer: Medicare Other | Admitting: Family Medicine

## 2021-07-09 ENCOUNTER — Encounter: Payer: Medicare Other | Admitting: Obstetrics & Gynecology

## 2021-07-13 ENCOUNTER — Encounter: Payer: Self-pay | Admitting: Pharmacist

## 2021-07-15 NOTE — Telephone Encounter (Signed)
Pt picked up 4 boxes of Ozempic 1mg .

## 2021-08-20 ENCOUNTER — Telehealth: Payer: Self-pay | Admitting: Pharmacist

## 2021-08-20 NOTE — Telephone Encounter (Signed)
Received request from [facility] pt assistance for Rx for Ozempic 1mg . ?Rx faxed ? ?

## 2021-08-26 ENCOUNTER — Other Ambulatory Visit: Payer: Self-pay | Admitting: Registered Nurse

## 2021-08-26 DIAGNOSIS — I10 Essential (primary) hypertension: Secondary | ICD-10-CM

## 2021-09-28 ENCOUNTER — Telehealth: Payer: Self-pay | Admitting: Registered Nurse

## 2021-09-28 NOTE — Telephone Encounter (Signed)
Left message for patient to call back and schedule Medicare Annual Wellness Visit (AWV). Please offer to do virtually or by telephone.  Left office number and my jabber #[phone]. ? ?Due for AWVI ? ?Please schedule at anytime with Nurse Health Advisor. ?  ?

## 2021-10-20 ENCOUNTER — Encounter: Payer: Self-pay | Admitting: Cardiology

## 2021-11-18 ENCOUNTER — Ambulatory Visit: Payer: Medicare Other

## 2021-11-18 ENCOUNTER — Telehealth: Payer: Self-pay

## 2021-11-18 NOTE — Telephone Encounter (Signed)
Called patient x3 on all available phone number listed. Unable to leave a message mailbox was full. Patient may reschedule for the next available appointment.  L.Anslie Spadafora,LPN

## 2021-11-19 NOTE — Telephone Encounter (Signed)
Noted  Thanks,  Rich

## 2021-12-06 NOTE — Telephone Encounter (Signed)
Left message for patient to call back and schedule Medicare Annual Wellness Visit (AWV). Please offer to do virtually or by telephone.  Left office number and my jabber #[phone]. ? ?Due for AWVI ? ?Please schedule at anytime with Nurse Health Advisor. ?  ?

## 2021-12-08 NOTE — Telephone Encounter (Signed)
Patients daughter picked up Ozempic.

## 2021-12-16 ENCOUNTER — Ambulatory Visit: Payer: Medicare Other

## 2021-12-19 ENCOUNTER — Encounter (HOSPITAL_COMMUNITY): Payer: Self-pay

## 2021-12-19 ENCOUNTER — Ambulatory Visit (HOSPITAL_COMMUNITY)
Admission: RE | Admit: 2021-12-19 | Discharge: 2021-12-19 | Disposition: A | Discharge status: Home / Self Care | Payer: Medicare Other | Source: Ambulatory Visit | Attending: Family Medicine | Admitting: Family Medicine

## 2021-12-19 VITALS — BP 101/71 | HR 66 | Temp 98.8°F | Resp 18

## 2021-12-19 DIAGNOSIS — M791 Myalgia, unspecified site: Secondary | ICD-10-CM | POA: Diagnosis not present

## 2021-12-19 DIAGNOSIS — M25512 Pain in left shoulder: Secondary | ICD-10-CM | POA: Diagnosis not present

## 2021-12-19 MED ORDER — KETOROLAC TROMETHAMINE 30 MG/ML IJ SOLN
30.0000 mg | Freq: Once | INTRAMUSCULAR | Status: AC
  Administered 2021-12-19: 30 mg via INTRAMUSCULAR

## 2021-12-19 MED ORDER — KETOROLAC TROMETHAMINE 30 MG/ML IJ SOLN
INTRAMUSCULAR | Status: AC
  Filled 2021-12-19: qty 1

## 2021-12-19 MED ORDER — IBUPROFEN 800 MG PO TABS: 800.0000 mg | ORAL_TABLET | Freq: Three times a day (TID) | ORAL | 0 refills | Status: DC | PRN

## 2021-12-19 NOTE — ED Provider Notes (Signed)
[facility]    CSN: [id number] Arrival date & time: 12/19/21  1533      History   Chief Complaint Chief Complaint  Patient presents with   Shoulder Pain    HPI Christine Hopkins is a 66 y.o. female.    Shoulder Pain  Here for left shoulder pain  On June 3 she fell when getting out of her tub and fell onto her left shoulder.  She was seen at [facility] emergency room in [place] at that time.  I can see the notes and results in care everywhere.  Initially other staff and I both understood her to say that they had told her she had a fracture on those x-rays.  When I told her that I could review those x-ray reports and that there is no broken bones on those x-rays, she then stated that she knew that there were no fractures.  She actually said that she was told she might end up needing an MRI.  The left shoulder continues to hurt, when she reaches for something, when she internally and externally rotates.  It is mainly in the shoulder joint and distally in the humerus where she hurts.  No numbness or tingling in the hand.  No new trauma  On review of labs in epic, her creatinine is normal  Past Medical History:  Diagnosis Date   Allergy    Anxiety    Arthritis    Depression    Diabetes mellitus without complication (HCC)    GERD (gastroesophageal reflux disease)    History of back surgery    Hyperlipidemia    Hypertension    Mild sleep apnea    Neuromuscular disorder (HCC)    OCD (obsessive compulsive disorder) 04/05/2019    Patient Active Problem List   Diagnosis Date Noted   OCD (obsessive compulsive disorder) 04/05/2019   Type 2 diabetes mellitus with hyperglycemia, without long-term current use of insulin (HCC) 02/18/2019   History of back surgery 11/10/2016   Essential hypertension 07/15/2016   Dyslipidemia 07/15/2016   Class 2 obesity due to excess calories with serious comorbidity and body mass index (BMI) of 36.0 to 36.9 in adult 07/15/2016    Adjustment disorder with depressed mood 07/15/2016   Esophageal dysphagia 06/26/2015    Past Surgical History:  Procedure Laterality Date   ESOPHAGEAL MANOMETRY N/A 07/13/2020   Procedure: ESOPHAGEAL MANOMETRY (EM);  Surgeon: Rachael Fee, MD;  Location: [facility];  Service: Endoscopy;  Laterality: N/A;   LUMBAR DISC SURGERY     ORIF ANKLE FRACTURE Right    and tibia    OB History   No obstetric history on file.      Home Medications    Prior to Admission medications   Medication Sig Start Date End Date Taking? Authorizing Provider  allopurinol (ZYLOPRIM) 300 MG tablet Take 1 tablet (300 mg total) by mouth daily. 06/10/21   Meriam Sprague, MD  aspirin EC 81 MG tablet Take 325 mg by mouth daily. Swallow whole.    [provider]  colchicine 0.6 MG tablet Take 1 tablet (0.6 mg total) by mouth 2 (two) times daily as needed. Take as needed for gout attack 05/11/21   Janeece Agee, NP  ibuprofen (ADVIL) 800 MG tablet Take 1 tablet (800 mg total) by mouth every 8 (eight) hours as needed (pain). 12/19/21   Zenia Resides, MD  ketoconazole (NIZORAL) 2 % cream Apply 1 application topically daily. 05/31/21   Louann Sjogren, DPM  lisinopril (ZESTRIL) 20 MG tablet TAKE 1 TABLET BY MOUTH  DAILY 08/26/21   Janeece Agee, NP  rosuvastatin (CRESTOR) 20 MG tablet Take 1 tablet (20 mg total) by mouth daily. 05/31/21   Meriam Sprague, MD  Semaglutide, 1 MG/DOSE, (OZEMPIC, 1 MG/DOSE,) 4 MG/3ML SOPN Inject 1 mg into the skin once a week. 08/20/21   Meriam Sprague, MD  sertraline (ZOLOFT) 100 MG tablet TAKE 1 TABLET BY MOUTH  DAILY 06/24/21   Janeece Agee, NP    Family History Family History  Problem Relation Age of Onset   Depression Father    Mental illness Sister    Depression Sister    Schizophrenia Sister    Diabetes Brother    Hyperlipidemia Brother    Mental illness Brother    Depression Brother    Schizophrenia Brother    Heart disease Brother     Diabetes Brother    Diabetes Maternal Grandmother    Heart disease Maternal Grandfather    Heart disease Paternal Grandmother    Colon polyps Neg Hx    Prostate cancer Neg Hx    Rectal cancer Neg Hx    Stomach cancer Neg Hx    Pancreatic cancer Neg Hx     Social History Social History   Tobacco Use   Smoking status: Never   Smokeless tobacco: Never  Vaping Use   Vaping Use: Never used  Substance Use Topics   Alcohol use: No   Drug use: No     Allergies   Patient has no known allergies.   Review of Systems Review of Systems   Physical Exam Triage Vital Signs ED Triage Vitals  Enc Vitals Group     BP 12/19/21 1552 101/71     Pulse Rate 12/19/21 1552 66     Resp 12/19/21 1552 18     Temp 12/19/21 1552 98.8 F (37.1 C)     Temp Source 12/19/21 1552 Oral     SpO2 12/19/21 1552 95 %     Weight --      Height --      Head Circumference --      Peak Flow --      Pain Score 12/19/21 1551 10     Pain Loc --      Pain Edu? --      Excl. in GC? --    No data found.  Updated Vital Signs BP 101/71 (BP Location: Right Arm)   Pulse 66   Temp 98.8 F (37.1 C) (Oral)   Resp 18   SpO2 95%   Visual Acuity Right Eye Distance:   Left Eye Distance:   Bilateral Distance:    Right Eye Near:   Left Eye Near:    Bilateral Near:     Physical Exam Vitals reviewed.  Constitutional:      General: She is not in acute distress.    Appearance: She is not ill-appearing, toxic-appearing or diaphoretic.  HENT:     Mouth/Throat:     Mouth: Mucous membranes are moist.  Eyes:     Extraocular Movements: Extraocular movements intact.     Pupils: Pupils are equal, round, and reactive to light.  Cardiovascular:     Rate and Rhythm: Normal rate and regular rhythm.     Heart sounds: No murmur heard. Pulmonary:     Effort: Pulmonary effort is normal.     Breath sounds: Normal breath sounds.  Musculoskeletal:     Cervical back: Neck supple.  Comments: There is pain on  range of motion about the left shoulder.  No rash or erythema  Lymphadenopathy:     Cervical: No cervical adenopathy.  Skin:    Coloration: Skin is not jaundiced or pale.  Neurological:     General: No focal deficit present.     Mental Status: She is alert and oriented to person, place, and time.  Psychiatric:        Behavior: Behavior normal.      UC Treatments / Results  Labs (all labs ordered are listed, but only abnormal results are displayed) Labs Reviewed - No data to display  EKG   Radiology No results found.  Procedures Procedures (including critical care time)  Medications Ordered in UC Medications  ketorolac (TORADOL) 30 MG/ML injection 30 mg (has no administration in time range)    Initial Impression / Assessment and Plan / UC Course  I have reviewed the triage vital signs and the nursing notes.  Pertinent labs & imaging results that were available during my care of the patient were reviewed by me and considered in my medical decision making (see chart for details).     She is given contact information for orthopedics.  Also she is given range of motion exercises for her shoulder.  I have sent in prescription for prescription strength ibuprofen, and she is given a shot of Toradol today. Final Clinical Impressions(s) / UC Diagnoses   Final diagnoses:  Acute pain of left shoulder     Discharge Instructions      You have been given a shot of Toradol 30 mg today.  Take ibuprofen 800 mg--1 tab every 8 hours as needed for pain.  You were referred to a primary care practice in [place], not an orthopedic office.     ED Prescriptions     Medication Sig Dispense Auth. Provider   ibuprofen (ADVIL) 800 MG tablet Take 1 tablet (800 mg total) by mouth every 8 (eight) hours as needed (pain). 21 tablet Cheryl Chay, Janace Aris, MD      I have reviewed the PDMP during this encounter.   Zenia Resides, MD 12/19/21 [phone]

## 2021-12-19 NOTE — Discharge Instructions (Addendum)
You have been given a shot of Toradol 30 mg today.  Take ibuprofen 800 mg--1 tab every 8 hours as needed for pain.  You were referred to a primary care practice in [place], not an orthopedic office.

## 2021-12-19 NOTE — ED Triage Notes (Signed)
Pt fell onto left shoulder/arm pain about 3 weeks ago. Was seen at ED and had no fractures on xray when was done. Pt reports pain is really bad and will feel like arm will fall off.  Pt reports that she was hoping pain would get better on it's on and hasnt been taking any meds for the pain.

## 2021-12-28 ENCOUNTER — Ambulatory Visit (INDEPENDENT_AMBULATORY_CARE_PROVIDER_SITE_OTHER): Payer: Medicare Other

## 2021-12-28 DIAGNOSIS — Z0000 Encounter for general adult medical examination without abnormal findings: Secondary | ICD-10-CM | POA: Diagnosis not present

## 2021-12-28 NOTE — Patient Instructions (Signed)
Christine Hopkins , Thank you for taking time to come for your Medicare Wellness Visit. I appreciate your ongoing commitment to your health goals. Please review the following plan we discussed and let me know if I can assist you in the future.   Screening recommendations/referrals: Colonoscopy: patient declines  Mammogram: patient declines  Bone Density: ordered 05/11/2021 Recommended yearly ophthalmology/optometry visit for glaucoma screening and checkup Recommended yearly dental visit for hygiene and checkup  Vaccinations: Influenza vaccine: completed  Pneumococcal vaccine: completed  Tdap vaccine: 09/13/2017 Shingles vaccine: will consider     Advanced directives: none   Conditions/risks identified: none  Next appointment: none    Preventive Care 65 Years and Older, Female Preventive care refers to lifestyle choices and visits with your health care provider that can promote health and wellness. What does preventive care include? A yearly physical exam. This is also called an annual well check. Dental exams once or twice a year. Routine eye exams. Ask your health care provider how often you should have your eyes checked. Personal lifestyle choices, including: Daily care of your teeth and gums. Regular physical activity. Eating a healthy diet. Avoiding tobacco and drug use. Limiting alcohol use. Practicing safe sex. Taking low-dose aspirin every day. Taking vitamin and mineral supplements as recommended by your health care provider. What happens during an annual well check? The services and screenings done by your health care provider during your annual well check will depend on your age, overall health, lifestyle risk factors, and family history of disease. Counseling  Your health care provider may ask you questions about your: Alcohol use. Tobacco use. Drug use. Emotional well-being. Home and relationship well-being. Sexual activity. Eating habits. History of  falls. Memory and ability to understand (cognition). Work and work Astronomer. Reproductive health. Screening  You may have the following tests or measurements: Height, weight, and BMI. Blood pressure. Lipid and cholesterol levels. These may be checked every 5 years, or more frequently if you are over 37 years old. Skin check. Lung cancer screening. You may have this screening every year starting at age 39 if you have a 30-pack-year history of smoking and currently smoke or have quit within the past 15 years. Fecal occult blood test (FOBT) of the stool. You may have this test every year starting at age 60. Flexible sigmoidoscopy or colonoscopy. You may have a sigmoidoscopy every 5 years or a colonoscopy every 10 years starting at age 32. Hepatitis C blood test. Hepatitis B blood test. Sexually transmitted disease (STD) testing. Diabetes screening. This is done by checking your blood sugar (glucose) after you have not eaten for a while (fasting). You may have this done every 1-3 years. Bone density scan. This is done to screen for osteoporosis. You may have this done starting at age 62. Mammogram. This may be done every 1-2 years. Talk to your health care provider about how often you should have regular mammograms. Talk with your health care provider about your test results, treatment options, and if necessary, the need for more tests. Vaccines  Your health care provider may recommend certain vaccines, such as: Influenza vaccine. This is recommended every year. Tetanus, diphtheria, and acellular pertussis (Tdap, Td) vaccine. You may need a Td booster every 10 years. Zoster vaccine. You may need this after age 11. Pneumococcal 13-valent conjugate (PCV13) vaccine. One dose is recommended after age 61. Pneumococcal polysaccharide (PPSV23) vaccine. One dose is recommended after age 63. Talk to your health care provider about which screenings and vaccines you  need and how often you need  them. This information is not intended to replace advice given to you by your health care provider. Make sure you discuss any questions you have with your health care provider. Document Released: 07/03/2015 Document Revised: 02/24/2016 Document Reviewed: 04/07/2015 Elsevier Interactive Patient Education  2017 [place] Prevention in the Home Falls can cause injuries. They can happen to people of all ages. There are many things you can do to make your home safe and to help prevent falls. What can I do on the outside of my home? Regularly fix the edges of walkways and driveways and fix any cracks. Remove anything that might make you trip as you walk through a door, such as a raised step or threshold. Trim any bushes or trees on the path to your home. Use bright outdoor lighting. Clear any walking paths of anything that might make someone trip, such as rocks or tools. Regularly check to see if handrails are loose or broken. Make sure that both sides of any steps have handrails. Any raised decks and porches should have guardrails on the edges. Have any leaves, snow, or ice cleared regularly. Use sand or salt on walking paths during winter. Clean up any spills in your garage right away. This includes oil or grease spills. What can I do in the bathroom? Use night lights. Install grab bars by the toilet and in the tub and shower. Do not use towel bars as grab bars. Use non-skid mats or decals in the tub or shower. If you need to sit down in the shower, use a plastic, non-slip stool. Keep the floor dry. Clean up any water that spills on the floor as soon as it happens. Remove soap buildup in the tub or shower regularly. Attach bath mats securely with double-sided non-slip rug tape. Do not have throw rugs and other things on the floor that can make you trip. What can I do in the bedroom? Use night lights. Make sure that you have a light by your bed that is easy to reach. Do not use  any sheets or blankets that are too big for your bed. They should not hang down onto the floor. Have a firm chair that has side arms. You can use this for support while you get dressed. Do not have throw rugs and other things on the floor that can make you trip. What can I do in the kitchen? Clean up any spills right away. Avoid walking on wet floors. Keep items that you use a lot in easy-to-reach places. If you need to reach something above you, use a strong step stool that has a grab bar. Keep electrical cords out of the way. Do not use floor polish or wax that makes floors slippery. If you must use wax, use non-skid floor wax. Do not have throw rugs and other things on the floor that can make you trip. What can I do with my stairs? Do not leave any items on the stairs. Make sure that there are handrails on both sides of the stairs and use them. Fix handrails that are broken or loose. Make sure that handrails are as long as the stairways. Check any carpeting to make sure that it is firmly attached to the stairs. Fix any carpet that is loose or worn. Avoid having throw rugs at the top or bottom of the stairs. If you do have throw rugs, attach them to the floor with carpet tape. Make sure that  you have a light switch at the top of the stairs and the bottom of the stairs. If you do not have them, ask someone to add them for you. What else can I do to help prevent falls? Wear shoes that: Do not have high heels. Have rubber bottoms. Are comfortable and fit you well. Are closed at the toe. Do not wear sandals. If you use a stepladder: Make sure that it is fully opened. Do not climb a closed stepladder. Make sure that both sides of the stepladder are locked into place. Ask someone to hold it for you, if possible. Clearly mark and make sure that you can see: Any grab bars or handrails. First and last steps. Where the edge of each step is. Use tools that help you move around (mobility aids)  if they are needed. These include: Canes. Walkers. Scooters. Crutches. Turn on the lights when you go into a dark area. Replace any light bulbs as soon as they burn out. Set up your furniture so you have a clear path. Avoid moving your furniture around. If any of your floors are uneven, fix them. If there are any pets around you, be aware of where they are. Review your medicines with your doctor. Some medicines can make you feel dizzy. This can increase your chance of falling. Ask your doctor what other things that you can do to help prevent falls. This information is not intended to replace advice given to you by your health care provider. Make sure you discuss any questions you have with your health care provider. Document Released: 04/02/2009 Document Revised: 11/12/2015 Document Reviewed: 07/11/2014 Elsevier Interactive Patient Education  2017 [employer].

## 2021-12-28 NOTE — Progress Notes (Signed)
Subjective:   Christine Hopkins is a 66 y.o. female who presents for an Initial Medicare Annual Wellness Visit.   I connected with Marthe Patch today by telephone and verified that I am speaking with the correct person using two identifiers. Location patient: home Location provider: work Persons participating in the virtual visit: patient, provider.   I discussed the limitations, risks, security and privacy concerns of performing an evaluation and management service by telephone and the availability of in person appointments. I also discussed with the patient that there may be a patient responsible charge related to this service. The patient expressed understanding and verbally consented to this telephonic visit.    Interactive audio and video telecommunications were attempted between this provider and patient, however failed, due to patient having technical difficulties OR patient did not have access to video capability.  We continued and completed visit with audio only.    Review of Systems    Cardiac Risk Factors include: advanced age (>63men, >25 women);diabetes mellitus     Objective:    Today's Vitals   There is no height or weight on file to calculate BMI.     12/28/2021    4:00 PM 04/03/2018    2:30 PM 03/20/2018    9:48 AM 03/13/2018    1:50 PM 08/02/2016    1:00 PM 06/20/2016   10:37 PM 06/11/2016    7:45 PM  Advanced Directives  Does Patient Have a Medical Advance Directive? No No No No  No No  Would patient like information on creating a medical advance directive? No - Patient declined  No - Patient declined No - Patient declined   No - Patient declined     Information is confidential and restricted. Go to Review Flowsheets to unlock data.    Current Medications (verified) Outpatient Encounter Medications as of 12/28/2021  Medication Sig   allopurinol (ZYLOPRIM) 300 MG tablet Take 1 tablet (300 mg total) by mouth daily.   aspirin EC 81 MG tablet Take 325 mg by  mouth daily. Swallow whole.   colchicine 0.6 MG tablet Take 1 tablet (0.6 mg total) by mouth 2 (two) times daily as needed. Take as needed for gout attack   ibuprofen (ADVIL) 800 MG tablet Take 1 tablet (800 mg total) by mouth every 8 (eight) hours as needed (pain).   ketoconazole (NIZORAL) 2 % cream Apply 1 application topically daily.   lisinopril (ZESTRIL) 20 MG tablet TAKE 1 TABLET BY MOUTH  DAILY   rosuvastatin (CRESTOR) 20 MG tablet Take 1 tablet (20 mg total) by mouth daily.   Semaglutide, 1 MG/DOSE, (OZEMPIC, 1 MG/DOSE,) 4 MG/3ML SOPN Inject 1 mg into the skin once a week.   sertraline (ZOLOFT) 100 MG tablet TAKE 1 TABLET BY MOUTH  DAILY   No facility-administered encounter medications on file as of 12/28/2021.    Allergies (verified) Patient has no known allergies.   History: Past Medical History:  Diagnosis Date   Allergy    Anxiety    Arthritis    Depression    Diabetes mellitus without complication (HCC)    GERD (gastroesophageal reflux disease)    History of back surgery    Hyperlipidemia    Hypertension    Mild sleep apnea    Neuromuscular disorder (HCC)    OCD (obsessive compulsive disorder) 04/05/2019   Past Surgical History:  Procedure Laterality Date   ESOPHAGEAL MANOMETRY N/A 07/13/2020   Procedure: ESOPHAGEAL MANOMETRY (EM);  Surgeon: Rachael Fee, MD;  Location:  [facility];  Service: Endoscopy;  Laterality: N/A;   LUMBAR DISC SURGERY     ORIF ANKLE FRACTURE Right    and tibia   Family History  Problem Relation Age of Onset   Depression Father    Mental illness Sister    Depression Sister    Schizophrenia Sister    Diabetes Brother    Hyperlipidemia Brother    Mental illness Brother    Depression Brother    Schizophrenia Brother    Heart disease Brother    Diabetes Brother    Diabetes Maternal Grandmother    Heart disease Maternal Grandfather    Heart disease Paternal Grandmother    Colon polyps Neg Hx    Prostate cancer Neg Hx     Rectal cancer Neg Hx    Stomach cancer Neg Hx    Pancreatic cancer Neg Hx    Social History   Socioeconomic History   Marital status: Single    Spouse name: Not on file   Number of children: 1   Years of education: Not on file   Highest education level: Not on file  Occupational History   Occupation: Disabled  Tobacco Use   Smoking status: Never   Smokeless tobacco: Never  Vaping Use   Vaping Use: Never used  Substance and Sexual Activity   Alcohol use: No   Drug use: No   Sexual activity: Not Currently  Other Topics Concern   Not on file  Social History Narrative   Not on file   Social Determinants of Health   Financial Resource Strain: Low Risk  (12/28/2021)   Overall Financial Resource Strain (CARDIA)    Difficulty of Paying Living Expenses: Not hard at all  Food Insecurity: No Food Insecurity (12/28/2021)   Hunger Vital Sign    Worried About Running Out of Food in the Last Year: Never true    Ran Out of Food in the Last Year: Never true  Transportation Needs: No Transportation Needs (12/28/2021)   PRAPARE - Administrator, Civil Service (Medical): No    Lack of Transportation (Non-Medical): No  Physical Activity: Insufficiently Active (12/28/2021)   Exercise Vital Sign    Days of Exercise per Week: 2 days    Minutes of Exercise per Session: 30 min  Stress: No Stress Concern Present (12/28/2021)   [employer] of Occupational Health - Occupational Stress Questionnaire    Feeling of Stress : Not at all  Social Connections: Moderately Isolated (12/28/2021)   Social Connection and Isolation Panel [NHANES]    Frequency of Communication with Friends and Family: Three times a week    Frequency of Social Gatherings with Friends and Family: Three times a week    Attends Religious Services: More than 4 times per year    Active Member of Clubs or Organizations: No    Attends Banker Meetings: Never    Marital Status: Divorced    Tobacco  Counseling Counseling given: Not Answered   Clinical Intake:  Pre-visit preparation completed: Yes  Pain : No/denies pain     Nutritional Risks: None Diabetes: No  How often do you need to have someone help you when you read instructions, pamphlets, or other written materials from your doctor or pharmacy?: 1 - Never What is the last grade level you completed in school?: college  Diabetic?yes  Nutrition Risk Assessment:  Has the patient had any N/V/D within the last 2 months?  No  Does the patient have any  non-healing wounds?  No  Has the patient had any unintentional weight loss or weight gain?  No   Diabetes:  Is the patient diabetic?  Yes  If diabetic, was a CBG obtained today?  No  Did the patient bring in their glucometer from home?  No  How often do you monitor your CBG's? Never .   Financial Strains and Diabetes Management:  Are you having any financial strains with the device, your supplies or your medication? No .  Does the patient want to be seen by Chronic Care Management for management of their diabetes?  No  Would the patient like to be referred to a Nutritionist or for Diabetic Management?  No   Diabetic Exams:  Diabetic Eye Exam: Completed 06/2021 Diabetic Foot Exam: Overdue, Pt has been advised about the importance in completing this exam. Pt is scheduled for diabetic foot exam on next office visit .   Interpreter Needed?: No  Information entered by :: L.Teriyah Purington,LPN   Activities of Daily Living    12/28/2021    4:01 PM 12/28/2021    2:49 PM  In your present state of health, do you have any difficulty performing the following activities:  Hearing? 0 0  Vision? 0 0  Difficulty concentrating or making decisions? 0 0  Walking or climbing stairs? 0 0  Dressing or bathing? 0 0  Doing errands, shopping? 0 0  Preparing Food and eating ? N N  Using the Toilet? N N  In the past six months, have you accidently leaked urine? N N  Do you have problems  with loss of bowel control? N N  Managing your Medications? N N  Managing your Finances? N N  Housekeeping or managing your Housekeeping? N N    Patient Care Team: Janeece Agee, NP as PCP - General (Adult Health Nurse Practitioner) Meriam Sprague, MD as PCP - Cardiology (Cardiology) Quintella Reichert, MD as PCP - Sleep Medicine (Cardiology)  Indicate any recent Medical Services you may have received from other than [facility] providers in the past year (date may be approximate).     Assessment:   This is a routine wellness examination for Christine Hopkins.  Hearing/Vision screen Vision Screening - Comments:: Annual eye exams wear glasses   Dietary issues and exercise activities discussed: Current Exercise Habits: Home exercise routine, Type of exercise: walking, Time (Minutes): 30, Frequency (Times/Week): 2, Weekly Exercise (Minutes/Week): 60, Intensity: Mild, Exercise limited by: None identified   Goals Addressed   None    Depression Screen    12/28/2021    4:01 PM 12/28/2021    3:59 PM 05/11/2021    8:09 AM 01/28/2020    3:09 PM 07/03/2019   11:30 AM 05/13/2019   11:44 AM 04/05/2019    8:09 AM  PHQ 2/9 Scores  PHQ - 2 Score 0 0 0 0 0 0 0  PHQ- 9 Score   1        Fall Risk    12/28/2021    4:01 PM 12/28/2021    2:49 PM 05/11/2021    8:09 AM 01/28/2020    3:09 PM 07/03/2019   11:30 AM  Fall Risk   Falls in the past year? 1 1 1  0 0  Number falls in past yr: 0 0 0 0 0  Injury with Fall? 1 1 0 0 0  Risk for fall due to : No Fall Risks  No Fall Risks No Fall Risks   Follow up Falls evaluation completed;Education provided  Falls evaluation completed Falls evaluation completed Falls evaluation completed    FALL RISK PREVENTION PERTAINING TO THE HOME:  Any stairs in or around the home? No  If so, are there any without handrails? No  Home free of loose throw rugs in walkways, pet beds, electrical cords, etc? Yes  Adequate lighting in your home to reduce risk of falls? Yes    ASSISTIVE DEVICES UTILIZED TO PREVENT FALLS:  Life alert? No  Use of a cane, walker or w/c? No  Grab bars in the bathroom? No  Shower chair or bench in shower? No  Elevated toilet seat or a handicapped toilet? No     Cognitive Function:  Normal cognitive status assessed by telephone conversation  by this Nurse Health Advisor. No abnormalities found.        Immunizations Immunization History  Administered Date(s) Administered   Fluad Quad(high Dose 65+) 04/26/2021   Influenza-Unspecified 03/08/2016   PFIZER(Purple Top)SARS-COV-2 Vaccination 01/06/2020   PNEUMOCOCCAL CONJUGATE-20 05/11/2021   Pneumococcal Polysaccharide-23 07/03/2019   Tdap 09/13/2017    TDAP status: Up to date  Flu Vaccine status: Up to date  Pneumococcal vaccine status: Up to date  Covid-19 vaccine status: Completed vaccines  Qualifies for Shingles Vaccine? Yes   Zostavax completed No   Shingrix Completed?: No.    Education has been provided regarding the importance of this vaccine. Patient has been advised to call insurance company to determine out of pocket expense if they have not yet received this vaccine. Advised may also receive vaccine at local pharmacy or Health Dept. Verbalized acceptance and understanding.  Screening Tests Health Maintenance  Topic Date Due   OPHTHALMOLOGY EXAM  Never done   Zoster Vaccines- Shingrix (1 of 2) Never done   COLONOSCOPY (Pts 45-48yrs Insurance coverage will need to be confirmed)  Never done   MAMMOGRAM  Never done   COVID-19 Vaccine (2 - Pfizer risk series) 01/27/2020   DEXA SCAN  Never done   HEMOGLOBIN A1C  11/08/2021   INFLUENZA VACCINE  01/18/2022   FOOT EXAM  05/31/2022   TETANUS/TDAP  09/14/2027   Pneumonia Vaccine 39+ Years old  Completed   Hepatitis C Screening  Completed   HPV VACCINES  Aged Out    Health Maintenance  Health Maintenance Due  Topic Date Due   OPHTHALMOLOGY EXAM  Never done   Zoster Vaccines- Shingrix (1 of 2) Never done    COLONOSCOPY (Pts 45-69yrs Insurance coverage will need to be confirmed)  Never done   MAMMOGRAM  Never done   COVID-19 Vaccine (2 - Pfizer risk series) 01/27/2020   DEXA SCAN  Never done   HEMOGLOBIN A1C  11/08/2021    Colorectal cancer screening: Referral to GI placed patient declined . Pt aware the office will call re: appt.  Mammogram status: Ordered 05/11/2021 Patient declined . Pt provided with contact info and advised to call to schedule appt.   Bone Density status: Ordered 05/11/2021. Pt provided with contact info and advised to call to schedule appt.  Lung Cancer Screening: (Low Dose CT Chest recommended if Age 41-80 years, 30 pack-year currently smoking OR have quit w/in 15years.) does not qualify.   Lung Cancer Screening Referral: n/a  Additional Screening:  Hepatitis C Screening: does not qualify; Completed 09/13/2017  Vision Screening: Recommended annual ophthalmology exams for early detection of glaucoma and other disorders of the eye. Is the patient up to date with their annual eye exam?  Yes  Who is the provider or what is  the name of the office in which the patient attends annual eye exams? [facility]  If pt is not established with a provider, would they like to be referred to a provider to establish care? No .   Dental Screening: Recommended annual dental exams for proper oral hygiene  Community Resource Referral / Chronic Care Management: CRR required this visit?  No   CCM required this visit?  No      Plan:     I have personally reviewed and noted the following in the patient's chart:   Medical and social history Use of alcohol, tobacco or illicit drugs  Current medications and supplements including opioid prescriptions. Patient is not currently taking opioid prescriptions. Functional ability and status Nutritional status Physical activity Advanced directives List of other physicians Hospitalizations, surgeries, and ER visits in previous 12  months Vitals Screenings to include cognitive, depression, and falls Referrals and appointments  In addition, I have reviewed and discussed with patient certain preventive protocols, quality metrics, and best practice recommendations. A written personalized care plan for preventive services as well as general preventive health recommendations were provided to patient.     March RummageLaura Schyler Counsell, LPN   1/61/09607/04/2022   Nurse Notes: none

## 2022-02-04 ENCOUNTER — Other Ambulatory Visit: Payer: Self-pay

## 2022-02-04 MED ORDER — ROSUVASTATIN CALCIUM 20 MG PO TABS: 20.0000 mg | ORAL_TABLET | Freq: Every day | ORAL | 0 refills | Status: DC

## 2022-02-08 ENCOUNTER — Encounter: Payer: Self-pay | Admitting: Pharmacist

## 2022-03-03 ENCOUNTER — Encounter: Payer: Self-pay | Admitting: Cardiology

## 2022-03-14 ENCOUNTER — Other Ambulatory Visit: Payer: Self-pay | Admitting: Cardiology

## 2022-03-15 ENCOUNTER — Other Ambulatory Visit: Payer: Self-pay | Admitting: *Deleted

## 2022-03-15 DIAGNOSIS — M109 Gout, unspecified: Secondary | ICD-10-CM

## 2022-03-15 MED ORDER — ROSUVASTATIN CALCIUM 20 MG PO TABS: 20.0000 mg | ORAL_TABLET | Freq: Every day | ORAL | 0 refills | Status: DC

## 2022-03-15 MED ORDER — ALLOPURINOL 300 MG PO TABS: 300.0000 mg | ORAL_TABLET | Freq: Every day | ORAL | 0 refills | Status: DC

## 2022-03-21 ENCOUNTER — Other Ambulatory Visit: Payer: Self-pay | Admitting: Cardiology

## 2022-03-21 DIAGNOSIS — M109 Gout, unspecified: Secondary | ICD-10-CM

## 2022-04-25 ENCOUNTER — Encounter: Payer: Self-pay | Admitting: Pharmacist

## 2022-05-05 ENCOUNTER — Telehealth: Payer: Medicare Other | Admitting: Physician Assistant

## 2022-05-05 ENCOUNTER — Encounter: Payer: Self-pay | Admitting: Cardiology

## 2022-05-05 NOTE — Progress Notes (Signed)
Erroneous encounter

## 2022-05-10 ENCOUNTER — Encounter: Payer: Self-pay | Admitting: Cardiology

## 2022-05-10 ENCOUNTER — Other Ambulatory Visit: Payer: Self-pay

## 2022-05-10 DIAGNOSIS — M109 Gout, unspecified: Secondary | ICD-10-CM

## 2022-05-10 MED ORDER — ALLOPURINOL 300 MG PO TABS: 300.0000 mg | ORAL_TABLET | Freq: Every day | ORAL | 0 refills | Status: DC

## 2022-05-10 NOTE — Telephone Encounter (Signed)
Pt's pharmacy is requesting a refill on allopurinol. Would Dr. Shari Prows like to refill this medication? Please address

## 2022-05-11 ENCOUNTER — Other Ambulatory Visit: Payer: Self-pay | Admitting: Podiatry

## 2022-05-11 NOTE — Telephone Encounter (Signed)
Ozempic samples picked up today 11/22.

## 2022-05-26 ENCOUNTER — Ambulatory Visit: Payer: Medicare Other | Admitting: Cardiology

## 2022-05-30 ENCOUNTER — Other Ambulatory Visit: Payer: Self-pay

## 2022-05-30 MED ORDER — ROSUVASTATIN CALCIUM 20 MG PO TABS: 20.0000 mg | ORAL_TABLET | Freq: Every day | ORAL | 0 refills | Status: AC

## 2022-06-27 ENCOUNTER — Encounter: Payer: Self-pay | Admitting: Cardiology

## 2022-07-18 ENCOUNTER — Other Ambulatory Visit: Payer: Self-pay | Admitting: *Deleted

## 2022-07-18 DIAGNOSIS — M109 Gout, unspecified: Secondary | ICD-10-CM

## 2022-07-20 ENCOUNTER — Other Ambulatory Visit: Payer: Self-pay

## 2022-07-20 DIAGNOSIS — M109 Gout, unspecified: Secondary | ICD-10-CM

## 2022-07-20 MED ORDER — ALLOPURINOL 300 MG PO TABS: 300.0000 mg | ORAL_TABLET | Freq: Every day | ORAL | 0 refills | Status: AC

## 2022-07-20 NOTE — Telephone Encounter (Signed)
[facility] mail order pharmacy is requesting a refill on allopurinol. Would Dr. Johney Frame like to refill this medication? Please address

## 2022-07-24 ENCOUNTER — Other Ambulatory Visit: Payer: Self-pay | Admitting: Cardiology

## 2022-07-24 DIAGNOSIS — M109 Gout, unspecified: Secondary | ICD-10-CM

## 2022-08-11 NOTE — Telephone Encounter (Signed)
Daughter picked up Ozempic. 4 boxes of Ozempic 109m

## 2022-09-19 ENCOUNTER — Encounter: Payer: Self-pay | Admitting: Cardiology

## 2022-10-03 NOTE — Telephone Encounter (Signed)
Received notification that Ozempic 1mg  had shipped. We faxed a new Rx for the 2mg  rx a few weeks ago. Looks like they did not cancel out the 1mg . I called [facility] confirmed that the 2mg  rx has been processed and that it will be shipped out. Asked them to cancel the Rx for the 1mg .

## 2022-10-05 ENCOUNTER — Encounter: Payer: Self-pay | Admitting: *Deleted

## 2022-10-06 ENCOUNTER — Encounter: Payer: Self-pay | Admitting: Pharmacist

## 2022-10-28 NOTE — Telephone Encounter (Signed)
Patients daughter picked up Ozempic 2mg  x 4 pens

## 2022-12-26 ENCOUNTER — Encounter: Payer: Self-pay | Admitting: Cardiology

## 2023-01-13 ENCOUNTER — Encounter: Payer: Self-pay | Admitting: Pharmacist

## 2023-01-13 NOTE — Telephone Encounter (Signed)
Pt's daughter picked up med.

## 2023-03-26 ENCOUNTER — Other Ambulatory Visit: Payer: Self-pay

## 2023-03-26 ENCOUNTER — Emergency Department (HOSPITAL_COMMUNITY)
Admission: EM | Admit: 2023-03-26 | Discharge: 2023-03-26 | Disposition: A | Discharge status: Home / Self Care | Payer: Medicare Other | Attending: Emergency Medicine | Admitting: Emergency Medicine

## 2023-03-26 ENCOUNTER — Encounter (HOSPITAL_COMMUNITY): Payer: Self-pay

## 2023-03-26 ENCOUNTER — Telehealth: Payer: Medicare Other

## 2023-03-26 DIAGNOSIS — I10 Essential (primary) hypertension: Secondary | ICD-10-CM | POA: Diagnosis not present

## 2023-03-26 DIAGNOSIS — M545 Low back pain, unspecified: Secondary | ICD-10-CM | POA: Insufficient documentation

## 2023-03-26 DIAGNOSIS — Z79899 Other long term (current) drug therapy: Secondary | ICD-10-CM | POA: Insufficient documentation

## 2023-03-26 DIAGNOSIS — Z7982 Long term (current) use of aspirin: Secondary | ICD-10-CM | POA: Diagnosis not present

## 2023-03-26 DIAGNOSIS — Z794 Long term (current) use of insulin: Secondary | ICD-10-CM | POA: Insufficient documentation

## 2023-03-26 DIAGNOSIS — E119 Type 2 diabetes mellitus without complications: Secondary | ICD-10-CM | POA: Diagnosis not present

## 2023-03-26 MED ORDER — OXYCODONE-ACETAMINOPHEN 5-325 MG PO TABS
2.0000 | ORAL_TABLET | Freq: Once | ORAL | Status: DC
  Filled 2023-03-26: qty 2

## 2023-03-26 MED ORDER — OXYCODONE-ACETAMINOPHEN 5-325 MG PO TABS: 1.0000 | ORAL_TABLET | Freq: Four times a day (QID) | ORAL | 0 refills | Status: DC | PRN

## 2023-03-26 NOTE — Discharge Instructions (Signed)
Please follow-up closely with your neurosurgeon for further evaluation and managements of your lower back pain.  Return to ER if you have any concern.  Please be aware that your blood pressure is high today, take your blood pressure medication and have it rechecked promptly.
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
## 2023-03-27 ENCOUNTER — Encounter (HOSPITAL_COMMUNITY): Payer: Self-pay

## 2023-03-27 ENCOUNTER — Ambulatory Visit (INDEPENDENT_AMBULATORY_CARE_PROVIDER_SITE_OTHER): Payer: Medicare Other

## 2023-03-27 ENCOUNTER — Ambulatory Visit (HOSPITAL_COMMUNITY)
Admission: EM | Admit: 2023-03-27 | Discharge: 2023-03-27 | Disposition: A | Discharge status: Home / Self Care | Payer: Medicare Other | Attending: Internal Medicine | Admitting: Internal Medicine

## 2023-03-27 DIAGNOSIS — K5900 Constipation, unspecified: Secondary | ICD-10-CM

## 2023-03-27 DIAGNOSIS — R1031 Right lower quadrant pain: Secondary | ICD-10-CM

## 2023-03-27 DIAGNOSIS — M545 Low back pain, unspecified: Secondary | ICD-10-CM

## 2023-03-27 LAB — POCT URINALYSIS DIP (MANUAL ENTRY)
Bilirubin, UA: NEGATIVE
Glucose, UA: NEGATIVE mg/dL
Ketones, POC UA: NEGATIVE mg/dL
Leukocytes, UA: NEGATIVE
Nitrite, UA: NEGATIVE
Protein Ur, POC: NEGATIVE mg/dL
Spec Grav, UA: 1.02 (ref 1.010–1.025)
Urobilinogen, UA: 0.2 U/dL
pH, UA: 5.5 (ref 5.0–8.0)

## 2023-03-27 MED ORDER — KETOROLAC TROMETHAMINE 30 MG/ML IJ SOLN
30.0000 mg | Freq: Once | INTRAMUSCULAR | Status: AC
  Administered 2023-03-27: 30 mg via INTRAMUSCULAR

## 2023-03-27 MED ORDER — KETOROLAC TROMETHAMINE 30 MG/ML IJ SOLN
INTRAMUSCULAR | Status: AC
  Filled 2023-03-27: qty 1

## 2023-03-27 MED ORDER — POLYETHYLENE GLYCOL 3350 17 GM/SCOOP PO POWD: 1.0000 | Freq: Once | ORAL | 0 refills | Status: AC

## 2023-03-27 NOTE — Discharge Instructions (Addendum)
Your pain is likely due to a muscle strain which will improve on its own with time.  I am able to see large amount of stool in your x-ray in your colon. Use 1 capful of MiraLAX into 8 ounces of liquid (Gatorade, water) every 12 hours for the next 1 to 2 days, then once daily for 1 to 2 days, then as needed.  I gave you a shot of ketorolac in the clinic which will help with pain and inflammation.  Do not take any ibuprofen for 24 hours since I gave you this shot in the clinic. You may continue taking Percocet and muscle relaxer as needed for muscle aches/spasm. Apply heat 20 minutes on 20 minutes off as needed for pain and muscle spasm.  Please schedule an appointment with your primary care provider for follow-up.  You may also follow-up with a sports medicine provider as needed.  I have listed one on your paperwork for follow-up.  If you develop any new or worsening symptoms or if your symptoms do not start to improve, please return here or follow-up with your primary care provider. If your symptoms are severe, please go to the emergency room.
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
## 2023-03-27 NOTE — ED Triage Notes (Signed)
Patient here today with c/o LB pain since Friday, RLQ abd pain X 1 day, and some stabbing chest pain today. Has h/o discectomy. Has h/o pleurisy. She takes Percocet. She went to the ED yesterday.

## 2023-04-10 ENCOUNTER — Emergency Department (HOSPITAL_COMMUNITY)
Admission: EM | Admit: 2023-04-10 | Discharge: 2023-04-10 | Discharge status: Against Medical Advice | Payer: Medicare Other | Attending: Emergency Medicine | Admitting: Emergency Medicine

## 2023-04-10 ENCOUNTER — Other Ambulatory Visit: Payer: Self-pay

## 2023-04-10 ENCOUNTER — Encounter (HOSPITAL_COMMUNITY): Payer: Self-pay

## 2023-04-10 ENCOUNTER — Emergency Department (HOSPITAL_COMMUNITY): Payer: Medicare Other

## 2023-04-10 ENCOUNTER — Telehealth: Payer: Medicare Other | Admitting: Family Medicine

## 2023-04-10 DIAGNOSIS — R079 Chest pain, unspecified: Secondary | ICD-10-CM | POA: Insufficient documentation

## 2023-04-10 DIAGNOSIS — Z20822 Contact with and (suspected) exposure to covid-19: Secondary | ICD-10-CM | POA: Diagnosis not present

## 2023-04-10 DIAGNOSIS — R0602 Shortness of breath: Secondary | ICD-10-CM | POA: Insufficient documentation

## 2023-04-10 DIAGNOSIS — Z5321 Procedure and treatment not carried out due to patient leaving prior to being seen by health care provider: Secondary | ICD-10-CM | POA: Diagnosis not present

## 2023-04-10 DIAGNOSIS — R0781 Pleurodynia: Secondary | ICD-10-CM

## 2023-04-10 LAB — I-STAT CHEM 8, ED
BUN: 10 mg/dL (ref 8–23)
Calcium, Ion: 1.19 mmol/L (ref 1.15–1.40)
Chloride: 107 mmol/L (ref 98–111)
Creatinine, Ser: 0.8 mg/dL (ref 0.44–1.00)
Glucose, Bld: 105 mg/dL — ABNORMAL HIGH (ref 70–99)
HCT: 37 % (ref 36.0–46.0)
Hemoglobin: 12.6 g/dL (ref 12.0–15.0)
Potassium: 3.8 mmol/L (ref 3.5–5.1)
Sodium: 146 mmol/L — ABNORMAL HIGH (ref 135–145)
TCO2: 28 mmol/L (ref 22–32)

## 2023-04-10 LAB — RESP PANEL BY RT-PCR (RSV, FLU A&B, COVID)  RVPGX2
Influenza A by PCR: NEGATIVE
Influenza B by PCR: NEGATIVE
Resp Syncytial Virus by PCR: NEGATIVE
SARS Coronavirus 2 by RT PCR: NEGATIVE

## 2023-04-10 LAB — COMPREHENSIVE METABOLIC PANEL
ALT: 22 U/L (ref 0–44)
AST: 25 U/L (ref 15–41)
Albumin: 3.4 g/dL — ABNORMAL LOW (ref 3.5–5.0)
Alkaline Phosphatase: 74 U/L (ref 38–126)
Anion gap: 8 (ref 5–15)
BUN: 11 mg/dL (ref 8–23)
CO2: 27 mmol/L (ref 22–32)
Calcium: 8.7 mg/dL — ABNORMAL LOW (ref 8.9–10.3)
Chloride: 109 mmol/L (ref 98–111)
Creatinine, Ser: 0.79 mg/dL (ref 0.44–1.00)
GFR, Estimated: >60 mL/min (ref >60)
Glucose, Bld: 116 mg/dL — ABNORMAL HIGH (ref 70–99)
Potassium: 3.5 mmol/L (ref 3.5–5.1)
Sodium: 144 mmol/L (ref 135–145)
Total Bilirubin: 0.3 mg/dL (ref 0.3–1.2)
Total Protein: 6.4 g/dL — ABNORMAL LOW (ref 6.5–8.1)

## 2023-04-10 LAB — CBC
HCT: 38.9 % (ref 36.0–46.0)
Hemoglobin: 12.3 g/dL (ref 12.0–15.0)
MCH: 28 pg (ref 26.0–34.0)
MCHC: 31.6 g/dL (ref 30.0–36.0)
MCV: 88.6 fL (ref 80.0–100.0)
Platelets: 237 10*3/uL (ref 150–400)
RBC: 4.39 MIL/uL (ref 3.87–5.11)
RDW: 15.4 % (ref 11.5–15.5)
WBC: 9.1 10*3/uL (ref 4.0–10.5)
nRBC: 0 % (ref 0.0–0.2)

## 2023-04-10 LAB — TROPONIN I (HIGH SENSITIVITY): Troponin I (High Sensitivity): 20 ng/L — ABNORMAL HIGH (ref <18)

## 2023-04-10 MED ORDER — IOHEXOL 300 MG/ML  SOLN: 100.0000 mL | Freq: Once | INTRAMUSCULAR | Status: DC | PRN

## 2023-04-10 NOTE — Progress Notes (Signed)
Virtual Visit Consent   Christine Hopkins, you are scheduled for a virtual visit with a [facility] provider today. Just as with appointments in the office, your consent must be obtained to participate. Your consent will be active for this visit and any virtual visit you may have with one of our providers in the next 365 days. If you have a MyChart account, a copy of this consent can be sent to you electronically.  As this is a virtual visit, video technology does not allow for your provider to perform a traditional examination. This may limit your provider's ability to fully assess your condition. If your provider identifies any concerns that need to be evaluated in person or the need to arrange testing (such as labs, EKG, etc.), we will make arrangements to do so. Although advances in technology are sophisticated, we cannot ensure that it will always work on either your end or our end. If the connection with a video visit is poor, the visit may have to be switched to a telephone visit. With either a video or telephone visit, we are not always able to ensure that we have a secure connection.  By engaging in this virtual visit, you consent to the provision of healthcare and authorize for your insurance to be billed (if applicable) for the services provided during this visit. Depending on your insurance coverage, you may receive a charge related to this service.  I need to obtain your verbal consent now. Are you willing to proceed with your visit today? Christine Hopkins has provided verbal consent on 04/10/2023 for a virtual visit (video or telephone). [name], NP  Date: 04/10/2023 3:17 PM  Virtual Visit via Video Note   I, [name], connected with  Christine Hopkins  ([id number], 06-Aug-1955) on 04/10/23 at  3:15 PM EDT by a video-enabled telemedicine application and verified that I am speaking with the correct person using two identifiers.  Location: Patient: Virtual Visit Location Patient:  Home Provider: Virtual Visit Location Provider: Home Office   I discussed the limitations of evaluation and management by telemedicine and the availability of in person appointments. The patient expressed understanding and agreed to proceed.    History of Present Illness: Christine Hopkins is a 67 y.o. who identifies as a female who was assigned female at birth, and is being seen today for reports pain in chest, back, sides and abdomen. She says it kind of feels like when she had pleurisy, but she is not sure. It catches, and makes it hard to breath and is very painful.   Problems:  Patient Active Problem List   Diagnosis Date Noted   OCD (obsessive compulsive disorder) 04/05/2019   Type 2 diabetes mellitus with hyperglycemia, without long-term current use of insulin (HCC) 02/18/2019   History of back surgery 11/10/2016   Essential hypertension 07/15/2016   Dyslipidemia 07/15/2016   Class 2 obesity due to excess calories with serious comorbidity and body mass index (BMI) of 36.0 to 36.9 in adult 07/15/2016   Adjustment disorder with depressed mood 07/15/2016   Esophageal dysphagia 06/26/2015    Allergies: No Known Allergies Medications:  Current Outpatient Medications:    allopurinol (ZYLOPRIM) 300 MG tablet, Take 1 tablet (300 mg total) by mouth daily., Disp: 15 tablet, Rfl: 0   aspirin EC 81 MG tablet, Take 325 mg by mouth daily. Swallow whole., Disp: , Rfl:    colchicine 0.6 MG tablet, Take 1 tablet (0.6 mg total) by mouth 2 (two) times daily  as needed. Take as needed for gout attack, Disp: 90 tablet, Rfl: 0   ibuprofen (ADVIL) 800 MG tablet, Take 1 tablet (800 mg total) by mouth every 8 (eight) hours as needed (pain)., Disp: 21 tablet, Rfl: 0   lisinopril (ZESTRIL) 20 MG tablet, TAKE 1 TABLET BY MOUTH  DAILY, Disp: 30 tablet, Rfl: 11   oxyCODONE-acetaminophen (PERCOCET/ROXICET) 5-325 MG tablet, Take 1 tablet by mouth every 6 (six) hours as needed for severe pain., Disp: 6 tablet, Rfl:  0   rosuvastatin (CRESTOR) 20 MG tablet, Take 1 tablet (20 mg total) by mouth daily., Disp: 15 tablet, Rfl: 0   sertraline (ZOLOFT) 100 MG tablet, TAKE 1 TABLET BY MOUTH  DAILY, Disp: 90 tablet, Rfl: 3  Observations/Objective: Patient is well-developed, well-nourished in no acute distress.  Resting comfortably  at home.  Head is normocephalic, atraumatic.  No labored breathing.  Speech is clear and coherent with logical content.  Patient is alert and oriented at baseline.    Assessment and Plan:  1. Pleurodynia  Needs in person assessment, might benefit from Chest xray  Advised to go to local UC   Follow Up Instructions: I discussed the assessment and treatment plan with the patient. The patient was provided an opportunity to ask questions and all were answered. The patient agreed with the plan and demonstrated an understanding of the instructions.  A copy of instructions were sent to the patient via MyChart unless otherwise noted below.     The patient was advised to call back or seek an in-person evaluation if the symptoms worsen or if the condition fails to improve as anticipated.    [name], NP

## 2023-04-10 NOTE — ED Triage Notes (Signed)
Pt arrived reporting chest pain and SHOB intermittently. States she had a virtual appt and was advised to go to be evaluated. Patient in no acute distress. Speaking in full sentences. Denies any other symptoms.

## 2023-04-10 NOTE — ED Provider Triage Note (Cosign Needed Addendum)
Emergency Medicine Provider Triage Evaluation Note  Christine Hopkins , a 67 y.o. female  was evaluated in triage.  Pt complains of abdominal/chest pain.  Review of Systems  Positive:  Negative:   Physical Exam  BP (!) 149/111 (BP Location: Left Arm)   Pulse 80   Temp 98.4 F (36.9 C) (Oral)   Resp 18   Ht 5\' 4"  (1.626 m)   Wt 98.8 kg   SpO2 97%   BMI 37.39 kg/m  Gen:   Awake, no distress   Resp:  Normal effort  MSK:   Moves extremities without difficulty  Other:    Medical Decision Making  Medically screening exam initiated at 4:51 PM.  Appropriate orders placed.  Christine Hopkins was informed that the remainder of the evaluation will be completed by another provider, this initial triage assessment does not replace that evaluation, and the importance of remaining in the ED until their evaluation is complete.  Intermittent SOB and lower chest pain/abdominal pain x1 week. Pain in abdomen and lower chest have been sporadic and not associated with a particular area. No cough. Denies fever, nausea, vomiting, diarrhea, dysuria. States that doctor tested her stool and urine and found blood in stool and urine. I am ordering a CT abd/pelvis and was considering chest xray but decided to get CT chest since she will already be in the CT scanner.      Dorthy Cooler, [place] 04/10/23 1659

## 2023-04-10 NOTE — Patient Instructions (Signed)
  Marthe Patch, thank you for joining [name], NP for today's virtual visit.  While this provider is not your primary care provider (PCP), if your PCP is located in our provider database this encounter information will be shared with them immediately following your visit.   A [facility] MyChart account gives you access to today's visit and all your visits, tests, and labs performed at [facility] " click here if you don't have a [facility] MyChart account or go to [email or web address]  Consent: (Patient) Christine Hopkins provided verbal consent for this virtual visit at the beginning of the encounter.  Current Medications:  Current Outpatient Medications:    allopurinol (ZYLOPRIM) 300 MG tablet, Take 1 tablet (300 mg total) by mouth daily., Disp: 15 tablet, Rfl: 0   aspirin EC 81 MG tablet, Take 325 mg by mouth daily. Swallow whole., Disp: , Rfl:    colchicine 0.6 MG tablet, Take 1 tablet (0.6 mg total) by mouth 2 (two) times daily as needed. Take as needed for gout attack, Disp: 90 tablet, Rfl: 0   ibuprofen (ADVIL) 800 MG tablet, Take 1 tablet (800 mg total) by mouth every 8 (eight) hours as needed (pain)., Disp: 21 tablet, Rfl: 0   lisinopril (ZESTRIL) 20 MG tablet, TAKE 1 TABLET BY MOUTH  DAILY, Disp: 30 tablet, Rfl: 11   oxyCODONE-acetaminophen (PERCOCET/ROXICET) 5-325 MG tablet, Take 1 tablet by mouth every 6 (six) hours as needed for severe pain., Disp: 6 tablet, Rfl: 0   rosuvastatin (CRESTOR) 20 MG tablet, Take 1 tablet (20 mg total) by mouth daily., Disp: 15 tablet, Rfl: 0   sertraline (ZOLOFT) 100 MG tablet, TAKE 1 TABLET BY MOUTH  DAILY, Disp: 90 tablet, Rfl: 3   Medications ordered in this encounter:  No orders of the defined types were placed in this encounter.    *If you need refills on other medications prior to your next appointment, please contact your pharmacy*  Follow-Up: Call back or seek an in-person evaluation if the symptoms worsen or if  the condition fails to improve as anticipated.  [facility] [phone]    If you have been instructed to have an in-person evaluation today at a local Urgent Care facility, please use the link below. It will take you to a list of all of our available [facility], including address, phone number and hours of operation. Please do not delay care.  [facility]  If you or a family member do not have a primary care provider, use the link below to schedule a visit and establish care. When you choose a [facility] primary care physician or advanced practice provider, you gain a long-term partner in health. Find a Primary Care Provider  Learn more about [facility] in-office and virtual care options: [facility] Get Care Now

## 2023-04-12 ENCOUNTER — Encounter (HOSPITAL_COMMUNITY): Payer: Self-pay

## 2023-04-12 ENCOUNTER — Emergency Department (HOSPITAL_COMMUNITY)
Admission: EM | Admit: 2023-04-12 | Discharge: 2023-04-12 | Disposition: A | Discharge status: Home / Self Care | Payer: Medicare Other | Attending: Emergency Medicine | Admitting: Emergency Medicine

## 2023-04-12 ENCOUNTER — Emergency Department (HOSPITAL_COMMUNITY): Payer: Medicare Other

## 2023-04-12 ENCOUNTER — Other Ambulatory Visit: Payer: Self-pay

## 2023-04-12 DIAGNOSIS — I10 Essential (primary) hypertension: Secondary | ICD-10-CM | POA: Diagnosis not present

## 2023-04-12 DIAGNOSIS — R079 Chest pain, unspecified: Secondary | ICD-10-CM | POA: Diagnosis present

## 2023-04-12 DIAGNOSIS — J181 Lobar pneumonia, unspecified organism: Secondary | ICD-10-CM | POA: Diagnosis not present

## 2023-04-12 DIAGNOSIS — Z7982 Long term (current) use of aspirin: Secondary | ICD-10-CM | POA: Insufficient documentation

## 2023-04-12 DIAGNOSIS — R1030 Lower abdominal pain, unspecified: Secondary | ICD-10-CM | POA: Insufficient documentation

## 2023-04-12 DIAGNOSIS — Z79899 Other long term (current) drug therapy: Secondary | ICD-10-CM | POA: Diagnosis not present

## 2023-04-12 DIAGNOSIS — J189 Pneumonia, unspecified organism: Secondary | ICD-10-CM

## 2023-04-12 LAB — BASIC METABOLIC PANEL
Anion gap: 7 (ref 5–15)
BUN: 14 mg/dL (ref 8–23)
CO2: 26 mmol/L (ref 22–32)
Calcium: 8.4 mg/dL — ABNORMAL LOW (ref 8.9–10.3)
Chloride: 108 mmol/L (ref 98–111)
Creatinine, Ser: 0.7 mg/dL (ref 0.44–1.00)
GFR, Estimated: >60 mL/min (ref >60)
Glucose, Bld: 112 mg/dL — ABNORMAL HIGH (ref 70–99)
Potassium: 4.2 mmol/L (ref 3.5–5.1)
Sodium: 141 mmol/L (ref 135–145)

## 2023-04-12 LAB — CBC WITH DIFFERENTIAL/PLATELET
Abs Immature Granulocytes: 0.03 10*3/uL (ref 0.00–0.07)
Basophils Absolute: 0 10*3/uL (ref 0.0–0.1)
Basophils Relative: 1 %
Eosinophils Absolute: 0.2 10*3/uL (ref 0.0–0.5)
Eosinophils Relative: 2 %
HCT: 34.2 % — ABNORMAL LOW (ref 36.0–46.0)
Hemoglobin: 11.1 g/dL — ABNORMAL LOW (ref 12.0–15.0)
Immature Granulocytes: 0 %
Lymphocytes Relative: 28 %
Lymphs Abs: 2.3 10*3/uL (ref 0.7–4.0)
MCH: 28.1 pg (ref 26.0–34.0)
MCHC: 32.5 g/dL (ref 30.0–36.0)
MCV: 86.6 fL (ref 80.0–100.0)
Monocytes Absolute: 0.3 10*3/uL (ref 0.1–1.0)
Monocytes Relative: 4 %
Neutro Abs: 5.4 10*3/uL (ref 1.7–7.7)
Neutrophils Relative %: 65 %
Platelets: 208 10*3/uL (ref 150–400)
RBC: 3.95 MIL/uL (ref 3.87–5.11)
RDW: 15.3 % (ref 11.5–15.5)
WBC: 8.3 10*3/uL (ref 4.0–10.5)
nRBC: 0 % (ref 0.0–0.2)

## 2023-04-12 LAB — TROPONIN I (HIGH SENSITIVITY): Troponin I (High Sensitivity): 10 ng/L (ref <18)

## 2023-04-12 LAB — D-DIMER, QUANTITATIVE: D-Dimer, Quant: 0.91 ug{FEU}/mL — ABNORMAL HIGH (ref 0.00–0.50)

## 2023-04-12 MED ORDER — AMOXICILLIN-POT CLAVULANATE 875-125 MG PO TABS: 1.0000 | ORAL_TABLET | Freq: Two times a day (BID) | ORAL | 0 refills | Status: AC

## 2023-04-12 MED ORDER — IOHEXOL 350 MG/ML SOLN
100.0000 mL | Freq: Once | INTRAVENOUS | Status: AC | PRN
  Administered 2023-04-12: 100 mL via INTRAVENOUS

## 2023-04-12 MED ORDER — AMOXICILLIN-POT CLAVULANATE 875-125 MG PO TABS
1.0000 | ORAL_TABLET | Freq: Once | ORAL | Status: AC
  Administered 2023-04-12: 1 via ORAL
  Filled 2023-04-12: qty 1

## 2023-04-12 NOTE — ED Provider Notes (Signed)
[place]ERGENCY DEPARTMENT AT [facility] Provider Note   CSN: [id number] Arrival date & time: 04/12/23  1713     History  Chief Complaint  Patient presents with   Chest Pain    Christine Hopkins is a 67 y.o. female presented to ED with constellation of symptoms.  Patient reports that for about a week she has been having intermittent sharp pains across her chest, a mild cough, also reporting blood in her stool on and off with cramping sharp lower abdominal pains as well.  She came to the ED 2 days ago but had to leave due to prolonged waiting times.  She reports her symptoms are not getting any better and she came back because she had a CT pending at that time which was not completed and she is hoping to have this done. She  Reports she has a history of "pleurisy" and that these sharp pains feel similar to that.  She denies pleuritic pain specifically.  She does have a history of high blood pressure, high cholesterol.  She had a coronary CT scan in December 2021 that showed mild nonobstructive coronary disease and a calcium score Of 1.45 agastson units (65th percentile for age and gender).  She does not regularly have chest pain.  HPI     Home Medications Prior to Admission medications   Medication Sig Start Date End Date Taking? Authorizing Provider  allopurinol (ZYLOPRIM) 300 MG tablet Take 1 tablet (300 mg total) by mouth daily. 07/20/22  Yes Meriam Sprague, MD  amoxicillin-clavulanate (AUGMENTIN) 875-125 MG tablet Take 1 tablet by mouth every 12 (twelve) hours for 6 days. 04/13/23 04/19/23 Yes Terald Sleeper, MD  aspirin EC 325 MG tablet Take 325 mg by mouth See admin instructions. Take 325 mg by mouth in the morning when not taking the 81 mg strength- Swallow whole.   Yes [provider]  aspirin EC 81 MG tablet Take 81 mg by mouth See admin instructions. Take 81 mg by mouth in the morning when not taking the 325 mg strength- Swallow whole   Yes  [provider]  baclofen (LIORESAL) 10 MG tablet Take 10 mg by mouth 3 (three) times daily as needed for muscle spasms.   Yes [provider]  ibuprofen (ADVIL) 800 MG tablet Take 1 tablet (800 mg total) by mouth every 8 (eight) hours as needed (pain). 12/19/21  Yes Banister, Janace Aris, MD  lisinopril (ZESTRIL) 20 MG tablet TAKE 1 TABLET BY MOUTH  DAILY Patient taking differently: Take 20 mg by mouth in the morning. 08/26/21  Yes Janeece Agee, NP  rosuvastatin (CRESTOR) 20 MG tablet Take 1 tablet (20 mg total) by mouth daily. Patient taking differently: Take 20 mg by mouth at bedtime. 05/30/22  Yes Meriam Sprague, MD  sertraline (ZOLOFT) 100 MG tablet TAKE 1 TABLET BY MOUTH  DAILY Patient taking differently: Take 100 mg by mouth in the morning. 06/24/21  Yes Janeece Agee, NP  colchicine 0.6 MG tablet Take 1 tablet (0.6 mg total) by mouth 2 (two) times daily as needed. Take as needed for gout attack Patient not taking: Reported on 04/12/2023 05/11/21   Janeece Agee, NP  oxyCODONE-acetaminophen (PERCOCET/ROXICET) 5-325 MG tablet Take 1 tablet by mouth every 6 (six) hours as needed for severe pain. Patient not taking: Reported on 04/12/2023 03/26/23   Fayrene Helper, PA-C      Allergies    Patient has no known allergies.    Review of Systems  Review of Systems  Physical Exam Updated Vital Signs BP (!) 144/89 (BP Location: Right Arm)   Pulse (!) 58   Temp 98 F (36.7 C)   Resp 20   Ht 5\' 4"  (1.626 m)   Wt 98.8 kg   SpO2 96%   BMI 37.39 kg/m  Physical Exam Constitutional:      General: She is not in acute distress. HENT:     Head: Normocephalic and atraumatic.  Eyes:     Conjunctiva/sclera: Conjunctivae normal.     Pupils: Pupils are equal, round, and reactive to light.  Cardiovascular:     Rate and Rhythm: Normal rate and regular rhythm.  Pulmonary:     Effort: Pulmonary effort is normal. No respiratory distress.  Abdominal:     General: There is no  distension.     Tenderness: There is no abdominal tenderness.  Skin:    General: Skin is warm and dry.  Neurological:     General: No focal deficit present.     Mental Status: She is alert. Mental status is at baseline.  Psychiatric:        Mood and Affect: Mood normal.        Behavior: Behavior normal.     ED Results / Procedures / Treatments   Labs (all labs ordered are listed, but only abnormal results are displayed) Labs Reviewed  BASIC METABOLIC PANEL - Abnormal; Notable for the following components:      Result Value   Glucose, Bld 112 (*)    Calcium 8.4 (*)    All other components within normal limits  CBC WITH DIFFERENTIAL/PLATELET - Abnormal; Notable for the following components:   Hemoglobin 11.1 (*)    HCT 34.2 (*)    All other components within normal limits  D-DIMER, QUANTITATIVE - Abnormal; Notable for the following components:   D-Dimer, Quant 0.91 (*)    All other components within normal limits  TROPONIN I (HIGH SENSITIVITY)    EKG EKG Interpretation Date/Time:  Wednesday April 12 2023 18:17:35 EDT Ventricular Rate:  64 PR Interval:  116 QRS Duration:  82 QT Interval:  402 QTC Calculation: 415 R Axis:   74  Text Interpretation: Sinus rhythm Borderline short PR interval Probable anteroseptal infarct, old Confirmed by Alvester Chou [phone]) on 04/12/2023 6:29:03 PM  Radiology CT Angio Chest PE W and/or Wo Contrast  Result Date: 04/12/2023 CLINICAL DATA:  High probability for PE EXAM: CT ANGIOGRAPHY CHEST WITH CONTRAST TECHNIQUE: Multidetector CT imaging of the chest was performed using the standard protocol during bolus administration of intravenous contrast. Multiplanar CT image reconstructions and MIPs were obtained to evaluate the vascular anatomy. RADIATION DOSE REDUCTION: This exam was performed according to the departmental dose-optimization program which includes automated exposure control, adjustment of the mA and/or kV according to patient  size and/or use of iterative reconstruction technique. CONTRAST:  OMNIPAQUE IOHEXOL 350 MG/ML SOLN COMPARISON:  Cardiac CT 03/31/2020 FINDINGS: Cardiovascular: Satisfactory opacification of the pulmonary arteries to the segmental level. No evidence of pulmonary embolism. Normal heart size. No pericardial effusion. Mediastinum/Nodes: There is diffuse esophageal wall thickening, more significant in the distal esophagus. The esophagus is moderately distended with fluid. No enlarged lymph nodes are seen. Visualized thyroid gland is within normal limits. Lungs/Pleura: There some ill-defined minimal patchy ground-glass opacities in the inferior right upper lobe. Upper Abdomen: No acute abnormality. Musculoskeletal: No chest wall abnormality. No acute or significant osseous findings. Review of the MIP images confirms the above findings. IMPRESSION: 1.  No evidence for pulmonary embolism. 2. Diffuse esophageal wall thickening, more significant in the distal esophagus, worrisome for esophagitis. Underlying focal lesion can not be excluded. The esophagus is moderately distended. With fluid follow-up endoscopy or upper GI study recommended. 3. Minimal patchy ground-glass opacities in the inferior right upper lobe, likely infectious/inflammatory. Electronically Signed   By: Darliss Cheney M.D.   On: 04/12/2023 21:51   CT ABDOMEN PELVIS W CONTRAST  Result Date: 04/12/2023 CLINICAL DATA:  Acute abdominal pain EXAM: CT ABDOMEN AND PELVIS WITH CONTRAST TECHNIQUE: Multidetector CT imaging of the abdomen and pelvis was performed using the standard protocol following bolus administration of intravenous contrast. RADIATION DOSE REDUCTION: This exam was performed according to the departmental dose-optimization program which includes automated exposure control, adjustment of the mA and/or kV according to patient size and/or use of iterative reconstruction technique. CONTRAST:  OMNIPAQUE IOHEXOL 350 MG/ML SOLN COMPARISON:   None Available. FINDINGS: Lower chest: No acute abnormality. Hepatobiliary: No focal liver abnormality is seen. No gallstones, gallbladder wall thickening, or biliary dilatation. Pancreas: Unremarkable. No pancreatic ductal dilatation or surrounding inflammatory changes. Spleen: Normal in size without focal abnormality. Adrenals/Urinary Tract: Adrenal glands are within normal limits. Kidneys demonstrate a normal enhancement pattern bilaterally. No renal calculi or obstructive changes are seen. The bladder is within normal limits. Stomach/Bowel: No obstructive or inflammatory changes of the colon are seen. The appendix is within normal limits. Small bowel and stomach are unremarkable. The distal esophagus demonstrates significant circumferential wall thickening. Vascular/Lymphatic: Aortic atherosclerosis. No enlarged abdominal or pelvic lymph nodes. Reproductive: Uterus and bilateral adnexa are unremarkable. Other: No abdominal wall hernia or abnormality. No abdominopelvic ascites. Musculoskeletal: No acute or significant osseous findings. IMPRESSION: Circumferential thickening of the distal esophagus. This may simply be related to reflux disease. Clinical correlation is recommended. These changes are stable from a prior CT from 2021. No other focal abnormality is noted. Electronically Signed   By: Alcide Clever M.D.   On: 04/12/2023 21:50    Procedures Procedures    Medications Ordered in ED Medications  iohexol (OMNIPAQUE) 350 MG/ML injection 100 mL (100 mLs Intravenous Contrast Given 04/12/23 2002)  amoxicillin-clavulanate (AUGMENTIN) 875-125 MG per tablet 1 tablet (1 tablet Oral Given 04/12/23 2224)    ED Course/ Medical Decision Making/ A&P                                 Medical Decision Making Amount and/or Complexity of Data Reviewed Labs: ordered. Radiology: ordered. ECG/medicine tests: ordered.  Risk Prescription drug management.   This patient presents to the ED with concern for  intermittent pleuritic chest pain, sharp abdominal pain,. This involves an extensive number of treatment options, and is a complaint that carries with it a high risk of complications and morbidity.  The differential diagnosis includes esophagitis versus PE versus pneumonia versus intra-abdominal infection or pathology versus other  I ordered and personally interpreted labs.  The pertinent results include: No emergent findings  I ordered imaging studies including CT PE, CT abdomen pelvis I independently visualized and interpreted imaging which showed potential infection versus inflammation of the right upper lobe, no other emergent findings noted I agree with the radiologist interpretation  The patient was maintained on a cardiac monitor.  I personally viewed and interpreted the cardiac monitored which showed an underlying rhythm of: Sinus rhythm  Per my interpretation the patient's ECG shows no acute ischemic findings  I ordered medication  including Augmentin for community pneumonia  I have reviewed the patients home medicines and have made adjustments as needed  After the interventions noted above, I reevaluated the patient and found that they have: stayed the same   Dispostion:  Patient removed her own IV and was attempting to leave the department when her CT scans resulted.  I was able to discuss the results with the patient.  I do think it is reasonable to treat for potential community pneumonia with her pleuritic symptoms, and we agreed on initiating Augmentin at this time.   The patient was otherwise stable and I did not see an indication for hospitalization.  After consideration of the diagnostic results and the patients response to treatment, I feel that the patent would benefit from close outpatient follow-up.         Final Clinical Impression(s) / ED Diagnoses Final diagnoses:  Pneumonia of right upper lobe due to infectious organism    Rx / DC Orders ED Discharge  Orders          Ordered    amoxicillin-clavulanate (AUGMENTIN) 875-125 MG tablet  Every 12 hours        04/12/23 2222              Terald Sleeper, MD 04/12/23 [phone]

## 2023-04-12 NOTE — Discharge Instructions (Addendum)
Your CT scan showed that you may have pneumonia, infection or inflammation of the right side of your lung.  Based on your symptoms have decided to treat you with an antibiotic for potential infection.  You will take this twice a day as prescribed for a week.  Please try to take this antibiotic generally with food.  Augmentin can cause some stomach upset, nausea, and diarrhea in certain patients.

## 2023-04-12 NOTE — ED Triage Notes (Signed)
Patient is here for evaluation of sharp chest pain that goes across her chest and then over her stomach. Patient reports being seen here for the same a few days ago but had to leave. Reports same issues as a few days ago.

## 2023-04-13 NOTE — Plan of Care (Signed)
[facility] Tonsillectomy/Adenoidectomy, Postoperative PEDS care plan entered in error.

## 2023-06-23 ENCOUNTER — Other Ambulatory Visit: Payer: Self-pay | Admitting: Neurosurgery

## 2023-06-23 DIAGNOSIS — M5442 Lumbago with sciatica, left side: Secondary | ICD-10-CM

## 2023-06-29 ENCOUNTER — Emergency Department (HOSPITAL_COMMUNITY): Payer: Medicare Other

## 2023-06-29 ENCOUNTER — Other Ambulatory Visit: Payer: Self-pay

## 2023-06-29 ENCOUNTER — Inpatient Hospital Stay (HOSPITAL_COMMUNITY)
Admission: EM | Admit: 2023-06-29 | Discharge: 2023-07-03 | DRG: 390 | Disposition: A | Discharge status: Home / Self Care | Payer: Medicare Other | Attending: Internal Medicine | Admitting: Internal Medicine

## 2023-06-29 ENCOUNTER — Inpatient Hospital Stay (HOSPITAL_COMMUNITY): Payer: Medicare Other

## 2023-06-29 ENCOUNTER — Encounter (HOSPITAL_COMMUNITY): Payer: Self-pay

## 2023-06-29 DIAGNOSIS — I10 Essential (primary) hypertension: Secondary | ICD-10-CM | POA: Diagnosis present

## 2023-06-29 DIAGNOSIS — K219 Gastro-esophageal reflux disease without esophagitis: Secondary | ICD-10-CM | POA: Diagnosis present

## 2023-06-29 DIAGNOSIS — Z818 Family history of other mental and behavioral disorders: Secondary | ICD-10-CM

## 2023-06-29 DIAGNOSIS — Z905 Acquired absence of kidney: Secondary | ICD-10-CM

## 2023-06-29 DIAGNOSIS — F32A Depression, unspecified: Secondary | ICD-10-CM | POA: Diagnosis present

## 2023-06-29 DIAGNOSIS — F419 Anxiety disorder, unspecified: Secondary | ICD-10-CM | POA: Diagnosis present

## 2023-06-29 DIAGNOSIS — E1165 Type 2 diabetes mellitus with hyperglycemia: Secondary | ICD-10-CM | POA: Diagnosis present

## 2023-06-29 DIAGNOSIS — E66812 Obesity, class 2: Secondary | ICD-10-CM | POA: Diagnosis present

## 2023-06-29 DIAGNOSIS — Z8249 Family history of ischemic heart disease and other diseases of the circulatory system: Secondary | ICD-10-CM | POA: Diagnosis not present

## 2023-06-29 DIAGNOSIS — K56609 Unspecified intestinal obstruction, unspecified as to partial versus complete obstruction: Principal | ICD-10-CM | POA: Diagnosis present

## 2023-06-29 DIAGNOSIS — Z79899 Other long term (current) drug therapy: Secondary | ICD-10-CM

## 2023-06-29 DIAGNOSIS — Z83438 Family history of other disorder of lipoprotein metabolism and other lipidemia: Secondary | ICD-10-CM

## 2023-06-29 DIAGNOSIS — Z6837 Body mass index (BMI) 37.0-37.9, adult: Secondary | ICD-10-CM

## 2023-06-29 DIAGNOSIS — E876 Hypokalemia: Secondary | ICD-10-CM | POA: Diagnosis present

## 2023-06-29 DIAGNOSIS — K224 Dyskinesia of esophagus: Secondary | ICD-10-CM | POA: Diagnosis present

## 2023-06-29 DIAGNOSIS — Z833 Family history of diabetes mellitus: Secondary | ICD-10-CM

## 2023-06-29 DIAGNOSIS — Z7982 Long term (current) use of aspirin: Secondary | ICD-10-CM | POA: Diagnosis not present

## 2023-06-29 DIAGNOSIS — E785 Hyperlipidemia, unspecified: Secondary | ICD-10-CM | POA: Diagnosis present

## 2023-06-29 DIAGNOSIS — Z9889 Other specified postprocedural states: Secondary | ICD-10-CM

## 2023-06-29 LAB — URINALYSIS, ROUTINE W REFLEX MICROSCOPIC
Bilirubin Urine: NEGATIVE
Glucose, UA: NEGATIVE mg/dL
Ketones, ur: NEGATIVE mg/dL
Leukocytes,Ua: NEGATIVE
Nitrite: NEGATIVE
Protein, ur: 100 mg/dL — AB
Specific Gravity, Urine: 1.045 — ABNORMAL HIGH (ref 1.005–1.030)
pH: 8 (ref 5.0–8.0)

## 2023-06-29 LAB — COMPREHENSIVE METABOLIC PANEL
ALT: 16 U/L (ref 0–44)
AST: 26 U/L (ref 15–41)
Albumin: 4.2 g/dL (ref 3.5–5.0)
Alkaline Phosphatase: 77 U/L (ref 38–126)
Anion gap: 13 (ref 5–15)
BUN: 13 mg/dL (ref 8–23)
CO2: 20 mmol/L — ABNORMAL LOW (ref 22–32)
Calcium: 9.8 mg/dL (ref 8.9–10.3)
Chloride: 107 mmol/L (ref 98–111)
Creatinine, Ser: 0.91 mg/dL (ref 0.44–1.00)
GFR, Estimated: >60 mL/min (ref >60)
Glucose, Bld: 270 mg/dL — ABNORMAL HIGH (ref 70–99)
Potassium: 3.7 mmol/L (ref 3.5–5.1)
Sodium: 140 mmol/L (ref 135–145)
Total Bilirubin: 0.5 mg/dL (ref 0.0–1.2)
Total Protein: 8 g/dL (ref 6.5–8.1)

## 2023-06-29 LAB — GLUCOSE, CAPILLARY
Glucose-Capillary: 106 mg/dL — ABNORMAL HIGH (ref 70–99)
Glucose-Capillary: 111 mg/dL — ABNORMAL HIGH (ref 70–99)

## 2023-06-29 LAB — HEMOGLOBIN A1C
Hgb A1c MFr Bld: 6.6 % — ABNORMAL HIGH (ref 4.8–5.6)
Mean Plasma Glucose: 142.72 mg/dL

## 2023-06-29 LAB — CBC
HCT: 41.7 % (ref 36.0–46.0)
Hemoglobin: 13.9 g/dL (ref 12.0–15.0)
MCH: 28.2 pg (ref 26.0–34.0)
MCHC: 33.3 g/dL (ref 30.0–36.0)
MCV: 84.6 fL (ref 80.0–100.0)
Platelets: 314 10*3/uL (ref 150–400)
RBC: 4.93 MIL/uL (ref 3.87–5.11)
RDW: 15.7 % — ABNORMAL HIGH (ref 11.5–15.5)
WBC: 10.4 10*3/uL (ref 4.0–10.5)
nRBC: 0 % (ref 0.0–0.2)

## 2023-06-29 LAB — CBG MONITORING, ED: Glucose-Capillary: 130 mg/dL — ABNORMAL HIGH (ref 70–99)

## 2023-06-29 LAB — HIV ANTIBODY (ROUTINE TESTING W REFLEX): HIV Screen 4th Generation wRfx: NONREACTIVE

## 2023-06-29 LAB — LIPASE, BLOOD: Lipase: 33 U/L (ref 11–51)

## 2023-06-29 MED ORDER — GABAPENTIN 100 MG PO CAPS
100.0000 mg | ORAL_CAPSULE | Freq: Three times a day (TID) | ORAL | Status: DC | PRN
  Administered 2023-07-03: 100 mg via ORAL
  Filled 2023-06-29 (×2): qty 1

## 2023-06-29 MED ORDER — HYDROMORPHONE HCL 1 MG/ML IJ SOLN
0.5000 mg | INTRAMUSCULAR | Status: DC | PRN
Start: 2023-06-29 — End: 2023-07-03
  Administered 2023-06-29: 1 mg via INTRAVENOUS
  Administered 2023-06-29: 0.5 mg via INTRAVENOUS
  Administered 2023-06-30 (×2): 1 mg via INTRAVENOUS
  Administered 2023-07-01 (×2): 0.5 mg via INTRAVENOUS
  Administered 2023-07-02 (×2): 1 mg via INTRAVENOUS
  Administered 2023-07-02: 0.5 mg via INTRAVENOUS
  Filled 2023-06-29 (×9): qty 1

## 2023-06-29 MED ORDER — ONDANSETRON 4 MG PO TBDP
4.0000 mg | ORAL_TABLET | Freq: Once | ORAL | Status: AC | PRN
  Administered 2023-06-29: 4 mg via ORAL
  Filled 2023-06-29: qty 1

## 2023-06-29 MED ORDER — OXYCODONE HCL 5 MG PO TABS
5.0000 mg | ORAL_TABLET | ORAL | Status: DC | PRN
  Administered 2023-06-30: 5 mg via ORAL
  Filled 2023-06-29: qty 1

## 2023-06-29 MED ORDER — MORPHINE SULFATE (PF) 4 MG/ML IV SOLN
4.0000 mg | Freq: Once | INTRAVENOUS | Status: AC
  Administered 2023-06-29: 4 mg via INTRAVENOUS
  Filled 2023-06-29: qty 1

## 2023-06-29 MED ORDER — HYDRALAZINE HCL 20 MG/ML IJ SOLN
5.0000 mg | Freq: Four times a day (QID) | INTRAMUSCULAR | Status: DC | PRN
  Administered 2023-06-29: 5 mg via INTRAVENOUS
  Filled 2023-06-29: qty 1

## 2023-06-29 MED ORDER — ROSUVASTATIN CALCIUM 20 MG PO TABS
20.0000 mg | ORAL_TABLET | Freq: Every day | ORAL | Status: DC
  Administered 2023-06-29: 20 mg via ORAL
  Filled 2023-06-29: qty 1

## 2023-06-29 MED ORDER — LACTATED RINGERS IV SOLN: INTRAVENOUS | Status: AC

## 2023-06-29 MED ORDER — ALBUTEROL SULFATE (2.5 MG/3ML) 0.083% IN NEBU: 2.5000 mg | INHALATION_SOLUTION | RESPIRATORY_TRACT | Status: DC | PRN

## 2023-06-29 MED ORDER — IOHEXOL 300 MG/ML  SOLN
100.0000 mL | Freq: Once | INTRAMUSCULAR | Status: AC | PRN
  Administered 2023-06-29: 100 mL via INTRAVENOUS

## 2023-06-29 MED ORDER — LISINOPRIL 20 MG PO TABS
20.0000 mg | ORAL_TABLET | Freq: Every morning | ORAL | Status: DC
  Administered 2023-06-29 – 2023-07-03 (×5): 20 mg via ORAL
  Filled 2023-06-29: qty 2
  Filled 2023-06-29 (×4): qty 1

## 2023-06-29 MED ORDER — ONDANSETRON HCL 4 MG/2ML IJ SOLN
4.0000 mg | Freq: Once | INTRAMUSCULAR | Status: AC
  Administered 2023-06-29: 4 mg via INTRAVENOUS
  Filled 2023-06-29: qty 2

## 2023-06-29 MED ORDER — ENOXAPARIN SODIUM 40 MG/0.4ML IJ SOSY
40.0000 mg | PREFILLED_SYRINGE | INTRAMUSCULAR | Status: DC
  Administered 2023-06-29 – 2023-07-03 (×5): 40 mg via SUBCUTANEOUS
  Filled 2023-06-29 (×5): qty 0.4

## 2023-06-29 MED ORDER — SERTRALINE HCL 100 MG PO TABS
100.0000 mg | ORAL_TABLET | Freq: Every day | ORAL | Status: DC
  Administered 2023-06-29 – 2023-07-03 (×4): 100 mg via ORAL
  Filled 2023-06-29: qty 1
  Filled 2023-06-29: qty 2
  Filled 2023-06-29 (×2): qty 1

## 2023-06-29 MED ORDER — INSULIN ASPART 100 UNIT/ML IJ SOLN
0.0000 [IU] | INTRAMUSCULAR | Status: DC
  Administered 2023-06-29 – 2023-07-02 (×6): 2 [IU] via SUBCUTANEOUS
  Filled 2023-06-29: qty 0.15

## 2023-06-29 MED ORDER — ACETAMINOPHEN 650 MG RE SUPP: 650.0000 mg | Freq: Four times a day (QID) | RECTAL | Status: DC | PRN

## 2023-06-29 MED ORDER — ONDANSETRON HCL 4 MG/2ML IJ SOLN
4.0000 mg | Freq: Four times a day (QID) | INTRAMUSCULAR | Status: DC | PRN
  Administered 2023-06-30 – 2023-07-02 (×3): 4 mg via INTRAVENOUS
  Filled 2023-06-29 (×3): qty 2

## 2023-06-29 MED ORDER — SODIUM CHLORIDE 0.9 % IV BOLUS
1000.0000 mL | Freq: Once | INTRAVENOUS | Status: AC
  Administered 2023-06-29: 1000 mL via INTRAVENOUS

## 2023-06-29 MED ORDER — ONDANSETRON HCL 4 MG PO TABS: 4.0000 mg | ORAL_TABLET | Freq: Four times a day (QID) | ORAL | Status: DC | PRN

## 2023-06-29 MED ORDER — ACETAMINOPHEN 325 MG PO TABS: 650.0000 mg | ORAL_TABLET | Freq: Four times a day (QID) | ORAL | Status: DC | PRN

## 2023-06-29 MED ORDER — MORPHINE SULFATE (PF) 4 MG/ML IV SOLN
4.0000 mg | Freq: Once | INTRAVENOUS | Status: AC
Start: 2023-06-29 — End: 2023-06-29
  Administered 2023-06-29: 4 mg via INTRAVENOUS
  Filled 2023-06-29: qty 1

## 2023-06-29 NOTE — Consult Note (Signed)
 Consult Note  Christine Hopkins 02/27/1956  [id number].    Requesting MD: Dr. Dean Chief Complaint/Reason for Consult: SBO  HPI:  68 y.o. female with medical history significant for T2DM, anxiety, GERD, HLD, OCD, sleep apnea, arthritis who presented to [facility] emergency department with nausea, vomiting, abdominal pain.  Symptoms began abruptly last night after eating.  She has been completing a 3-day diet plan during which time she only eats between 2 PM and bedtime.  She had a bowel movement before and right after the pain started. Denies melena or hematochezia. She normally has a bowel movement daily. She took miralax  yesterday evening without improvement in symptoms. Pain has been worsening. She is no longer passing flatus. She denies respiratory symptoms, urinary symptoms, fever, chills. She has never experienced similar symptoms previously  CT in ED concerning for SBO and general surgery asked to evaluate  She has history of esophageal spasms for which she has seen GI in the past and had manometry completed. She has GERD and regurgitation related to this but these symptoms are at baseline to slightly improved following starting prilosec 2 weeks ago.   Substance use: denies Allergies: nkda Blood thinners: none Past Surgeries: prior abdominal surgery to remove ovary due to a mass in 1998 Occupation: retired LPN  ROS: Reviewed and as above  Family History  Problem Relation Age of Onset   Depression Father    Mental illness Sister    Depression Sister    Schizophrenia Sister    Diabetes Brother    Hyperlipidemia Brother    Mental illness Brother    Depression Brother    Schizophrenia Brother    Heart disease Brother    Diabetes Brother    Diabetes Maternal Grandmother    Heart disease Maternal Grandfather    Heart disease Paternal Grandmother    Colon polyps Neg Hx    Prostate cancer Neg Hx    Rectal cancer Neg Hx    Stomach cancer Neg Hx    Pancreatic  cancer Neg Hx     Past Medical History:  Diagnosis Date   Allergy    Anxiety    Arthritis    Depression    Diabetes mellitus without complication (HCC)    GERD (gastroesophageal reflux disease)    History of back surgery    Hyperlipidemia    Hypertension    Mild sleep apnea    Neuromuscular disorder (HCC)    OCD (obsessive compulsive disorder) 04/05/2019    Past Surgical History:  Procedure Laterality Date   ESOPHAGEAL MANOMETRY N/A 07/13/2020   Procedure: ESOPHAGEAL MANOMETRY (EM);  Surgeon: Teressa Toribio SQUIBB, MD;  Location: [facility] ENDOSCOPY;  Service: Endoscopy;  Laterality: N/A;   LUMBAR DISC SURGERY     ORIF ANKLE FRACTURE Right    and tibia    Social History:  reports that she has never smoked. She has never used smokeless tobacco. She reports that she does not drink alcohol and does not use drugs.  Allergies: No Known Allergies  (Not in a hospital admission)   Blood pressure (!) 166/89, pulse 66, temperature 98.4 F (36.9 C), temperature source Oral, resp. rate 16, height 5' 4 (1.626 m), weight 98.4 kg, SpO2 98%. Physical Exam: General: pleasant, WD, female who is laying in bed uncomfortable appearing, she is actively gagging intermittently during my visit and  HEENT: head is normocephalic, atraumatic.  Sclera are noninjected.  Pupils equal and round. EOMs intact.  Ears and nose without any masses  or lesions.  Mouth is pink and moist Heart: regular, rate, and rhythm.   Lungs: Respiratory effort nonlabored Abd: soft, moderate distension. Moderate TTP in epigastrium with voluntary guarding MSK: all 4 extremities are symmetrical with no cyanosis, clubbing, or edema. Skin: warm and dry with no masses, lesions, or rashes Neuro: Cranial nerves 2-12 grossly intact, sensation is normal throughout Psych: A&Ox3 with an appropriate affect.    Results for orders placed or performed during the hospital encounter of 06/29/23 (from the past 48 hours)  Lipase, blood     Status:  None   Collection Time: 06/29/23  6:41 AM  Result Value Ref Range   Lipase 33 11 - 51 U/L    Comment: Performed at [facility], [address], [place], [place] [address]  Comprehensive metabolic panel     Status: Abnormal   Collection Time: 06/29/23  6:41 AM  Result Value Ref Range   Sodium 140 135 - 145 mmol/L   Potassium 3.7 3.5 - 5.1 mmol/L   Chloride 107 98 - 111 mmol/L   CO2 20 (L) 22 - 32 mmol/L   Glucose, Bld 270 (H) 70 - 99 mg/dL    Comment: Glucose reference range applies only to samples taken after fasting for at least 8 hours.   BUN 13 8 - 23 mg/dL   Creatinine, Ser 9.08 0.44 - 1.00 mg/dL   Calcium  9.8 8.9 - 10.3 mg/dL   Total Protein 8.0 6.5 - 8.1 g/dL   Albumin 4.2 3.5 - 5.0 g/dL   AST 26 15 - 41 U/L   ALT 16 0 - 44 U/L   Alkaline Phosphatase 77 38 - 126 U/L   Total Bilirubin 0.5 0.0 - 1.2 mg/dL   GFR, Estimated >39 >39 mL/min    Comment: (NOTE) Calculated using the CKD-EPI Creatinine Equation (2021)    Anion gap 13 5 - 15    Comment: Performed at [facility], [address] [place], [place] [address]  CBC     Status: Abnormal   Collection Time: 06/29/23  6:41 AM  Result Value Ref Range   WBC 10.4 4.0 - 10.5 K/uL   RBC 4.93 3.87 - 5.11 MIL/uL   Hemoglobin 13.9 12.0 - 15.0 g/dL   HCT 58.2 63.9 - 53.9 %   MCV 84.6 80.0 - 100.0 fL   MCH 28.2 26.0 - 34.0 pg   MCHC 33.3 30.0 - 36.0 g/dL   RDW 84.2 (H) 88.4 - 84.4 %   Platelets 314 150 - 400 K/uL   nRBC 0.0 0.0 - 0.2 %    Comment: Performed at [facility], [address] [place], [place] [address]   CT ABDOMEN PELVIS W CONTRAST Result Date: 06/29/2023 CLINICAL DATA:  Acute onset sharp epigastric pain and vomiting after eating last night. EXAM: CT ABDOMEN AND PELVIS WITH CONTRAST TECHNIQUE: Multidetector CT imaging of the abdomen and pelvis was performed using the standard protocol following bolus administration of intravenous contrast. RADIATION DOSE  REDUCTION: This exam was performed according to the departmental dose-optimization program which includes automated exposure control, adjustment of the mA and/or kV according to patient size and/or use of iterative reconstruction technique. CONTRAST:  OMNIPAQUE  IOHEXOL  300 MG/ML  SOLN COMPARISON:  CT abdomen pelvis dated April 12, 2023. FINDINGS: Lower chest: No acute abnormality. Unchanged severe circumferential wall thickening of the distal esophagus. Hepatobiliary: No focal liver abnormality is seen. No gallstones, gallbladder wall thickening, or biliary dilatation. Pancreas: Unremarkable. No pancreatic  ductal dilatation or surrounding inflammatory changes. Spleen: Normal in size without focal abnormality. Adrenals/Urinary Tract: Adrenal glands are unremarkable. Kidneys are normal, without renal calculi, focal lesion, or hydronephrosis. Bladder is unremarkable. Stomach/Bowel: Prominently distended stomach. Multiple mildly dilated loops of small bowel with transition point in the anterior mid abdomen at the level of the umbilicus (series 8, image 54). Fecalization small bowel contents proximal to transition point. Decompressed colon with left-sided colonic diverticulosis. Normal appendix. Vascular/Lymphatic: Aortic atherosclerosis. No enlarged abdominal or pelvic lymph nodes. Reproductive: Uterus and bilateral adnexa are unremarkable. Other: Small amount of free fluid in the pelvis. No pneumoperitoneum. Musculoskeletal: No acute or significant osseous findings. IMPRESSION: 1. Small bowel obstruction with transition point in the anterior mid abdomen at the level of the umbilicus. 2. 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 Consider further evaluation with endoscopy if not previously performed. 3.  Aortic Atherosclerosis (ICD10-I70.0). Electronically Signed   By: Elsie ONEIDA Shoulder M.D.   On: 06/29/2023 10:03       Assessment/Plan SBO Possibly adhesive in setting of prior abd surgery - CT w/ Small bowel obstruction with transition point in the anterior mid abdomen at the level of the umbilicus. - No current indication for emergency surgery - Place NGT for decompression and keep NPO - will likely start SBO protocol once she has had sufficient decompression today - Keep K > 4 and Mg > 2 for bowel function - Mobilize for bowel function - Hopefully patient will improve with conservative management. If patient fails to improve with conservative management, she may require exploratory surgery during admission - Agree with medical admission. We will follow with you.   FEN: NPO, NGT ID: none VTE: okay for chemical prophylaxis from surgical standpoint   I reviewed ED provider notes, last 24 h vitals and pain scores, last 48 h intake and output, last 24 h labs and trends, and last 24 h imaging results.   [place] VEAR Mais, [facility] 06/29/2023, 10:44 AM Please see Amion for pager number during day hours 7:00am-4:30pm

## 2023-06-29 NOTE — ED Provider Notes (Signed)
 [place] EMERGENCY DEPARTMENT AT [facility] Provider Note   CSN: [id number] Arrival date & time: 06/29/23  9385     History  Chief Complaint  Patient presents with   Emesis    Christine Hopkins is a 68 y.o. female.  Pt is a 68 yo female with pmhx significant for htn, anxiety, arthritis, depression, gerd, hld, ocd, dm, and sleep apnea.  Pt said she has been having abd pain and n/v since last night.  She does not feel constipated, but took a miralax  in case that was the cause of her sx.  She has not had diarrhea.  No fever.  She did get a zofran  in triage which has helped some, but she still feels nauseous and has pain.       Home Medications Prior to Admission medications   Medication Sig Start Date End Date Taking? Authorizing Provider  allopurinol  (ZYLOPRIM ) 300 MG tablet Take 1 tablet (300 mg total) by mouth daily. 07/20/22  Yes Hobart Powell BRAVO, MD  aspirin EC 325 MG tablet Take 325 mg by mouth See admin instructions. Take 325 mg by mouth in the morning when not taking the 81 mg strength- Swallow whole.   Yes [provider]  aspirin EC 81 MG tablet Take 81 mg by mouth See admin instructions. Take 81 mg by mouth in the morning when not taking the 325 mg strength- Swallow whole   Yes [provider]  gabapentin  (NEURONTIN ) 100 MG capsule Take 100 mg by mouth 3 (three) times daily as needed (back pain).   Yes [provider]  ibuprofen  (ADVIL ) 800 MG tablet Take 1 tablet (800 mg total) by mouth every 8 (eight) hours as needed (pain). 12/19/21  Yes Banister, Sharlet POUR, MD  lisinopril  (ZESTRIL ) 20 MG tablet TAKE 1 TABLET BY MOUTH  DAILY Patient taking differently: Take 20 mg by mouth in the morning. 08/26/21  Yes Kip Ade, NP  rosuvastatin  (CRESTOR ) 20 MG tablet Take 1 tablet (20 mg total) by mouth daily. Patient taking differently: Take 20 mg by mouth at bedtime. 05/30/22  Yes Hobart Powell BRAVO, MD  sertraline  (ZOLOFT ) 100 MG tablet  TAKE 1 TABLET BY MOUTH  DAILY Patient taking differently: Take 100 mg by mouth in the morning. 06/24/21  Yes Kip Ade, NP      Allergies    Patient has no known allergies.    Review of Systems   Review of Systems  Gastrointestinal:  Positive for abdominal pain, nausea and vomiting.  All other systems reviewed and are negative.   Physical Exam Updated Vital Signs BP (!) 166/89 (BP Location: Right Arm)   Pulse 66   Temp 98.4 F (36.9 C) (Oral)   Resp 16   Ht 5' 4 (1.626 m)   Wt 98.4 kg   SpO2 98%   BMI 37.25 kg/m  Physical Exam Vitals and nursing note reviewed.  Constitutional:      Appearance: Normal appearance.  HENT:     Head: Normocephalic and atraumatic.     Right Ear: External ear normal.     Left Ear: External ear normal.     Nose: Nose normal.     Mouth/Throat:     Mouth: Mucous membranes are dry.  Eyes:     Extraocular Movements: Extraocular movements intact.     Conjunctiva/sclera: Conjunctivae normal.     Pupils: Pupils are equal, round, and reactive to light.  Cardiovascular:     Rate and Rhythm: Normal rate and regular  rhythm.     Pulses: Normal pulses.     Heart sounds: Normal heart sounds.  Pulmonary:     Effort: Pulmonary effort is normal.     Breath sounds: Normal breath sounds.  Abdominal:     General: Abdomen is flat. Bowel sounds are normal.     Palpations: Abdomen is soft.    Musculoskeletal:        General: Normal range of motion.     Cervical back: Normal range of motion and neck supple.  Skin:    General: Skin is warm.     Capillary Refill: Capillary refill takes less than 2 seconds.  Neurological:     General: No focal deficit present.     Mental Status: She is alert and oriented to person, place, and time.  Psychiatric:        Mood and Affect: Mood normal.        Behavior: Behavior normal.     ED Results / Procedures / Treatments   Labs (all labs ordered are listed, but only abnormal results are displayed) Labs  Reviewed  COMPREHENSIVE METABOLIC PANEL - Abnormal; Notable for the following components:      Result Value   CO2 20 (*)    Glucose, Bld 270 (*)    All other components within normal limits  CBC - Abnormal; Notable for the following components:   RDW 15.7 (*)    All other components within normal limits  LIPASE, BLOOD  URINALYSIS, ROUTINE W REFLEX MICROSCOPIC    EKG None  Radiology CT ABDOMEN PELVIS W CONTRAST Result Date: 06/29/2023 CLINICAL DATA:  Acute onset sharp epigastric pain and vomiting after eating last night. EXAM: CT ABDOMEN AND PELVIS WITH CONTRAST TECHNIQUE: Multidetector CT imaging of the abdomen and pelvis was performed using the standard protocol following bolus administration of intravenous contrast. RADIATION DOSE REDUCTION: This exam was performed according to the departmental dose-optimization program which includes automated exposure control, adjustment of the mA and/or kV according to patient size and/or use of iterative reconstruction technique. CONTRAST:  OMNIPAQUE  IOHEXOL  300 MG/ML  SOLN COMPARISON:  CT abdomen pelvis dated April 12, 2023. FINDINGS: Lower chest: No acute abnormality. Unchanged severe circumferential wall thickening of the distal esophagus. Hepatobiliary: No focal liver abnormality is seen. No gallstones, gallbladder wall thickening, or biliary dilatation. Pancreas: Unremarkable. No pancreatic ductal dilatation or surrounding inflammatory changes. Spleen: Normal in size without focal abnormality. Adrenals/Urinary Tract: Adrenal glands are unremarkable. Kidneys are normal, without renal calculi, focal lesion, or hydronephrosis. Bladder is unremarkable. Stomach/Bowel: Prominently distended stomach. Multiple mildly dilated loops of small bowel with transition point in the anterior mid abdomen at the level of the umbilicus (series 8, image 54). Fecalization small bowel contents proximal to transition point. Decompressed colon with left-sided colonic  diverticulosis. Normal appendix. Vascular/Lymphatic: Aortic atherosclerosis. No enlarged abdominal or pelvic lymph nodes. Reproductive: Uterus and bilateral adnexa are unremarkable. Other: Small amount of free fluid in the pelvis. No pneumoperitoneum. Musculoskeletal: No acute or significant osseous findings. IMPRESSION: 1. Small bowel obstruction with transition point in the anterior mid abdomen at the level of the umbilicus. 2. 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 Consider further evaluation with endoscopy if not previously performed. 3.  Aortic Atherosclerosis (ICD10-I70.0). Electronically Signed   By: Elsie ONEIDA Shoulder M.D.   On: 06/29/2023 10:03    Procedures Procedures    Medications Ordered in ED Medications  morphine  (PF) 4 MG/ML injection 4  mg (has no administration in time range)  ondansetron  (ZOFRAN -ODT) disintegrating tablet 4 mg (4 mg Oral Given 06/29/23 0642)  morphine  (PF) 4 MG/ML injection 4 mg (4 mg Intravenous Given 06/29/23 0857)  ondansetron  (ZOFRAN ) injection 4 mg (4 mg Intravenous Given 06/29/23 0857)  sodium chloride  0.9 % bolus 1,000 mL (0 mLs Intravenous Stopped 06/29/23 1049)  iohexol  (OMNIPAQUE ) 300 MG/ML solution 100 mL (100 mLs Intravenous Contrast Given 06/29/23 9094)    ED Course/ Medical Decision Making/ A&P                                 Medical Decision Making Amount and/or Complexity of Data Reviewed Labs: ordered. Radiology: ordered.  Risk Prescription drug management. Decision regarding hospitalization.   This patient presents to the ED for concern of abd pain, this involves an extensive number of treatment options, and is a complaint that carries with it a high risk of complications and morbidity.  The differential diagnosis includes gastritis, sbo, cholecystitis, appendicitis, diverticulitis   Co morbidities that complicate the patient evaluation  htn, anxiety,  arthritis, depression, gerd, hld, ocd, dm, and sleep apnea   Additional history obtained:  Additional history obtained from epic chart review  Lab Tests:  I Ordered, and personally interpreted labs.  The pertinent results include:  cbc  nl, cmp nl other than bs elevated at 270, lip nl   Imaging Studies ordered:  I ordered imaging studies including ct abd/pelvis  I independently visualized and interpreted imaging which showed  IMPRESSION:  1. Small bowel obstruction with transition point in the anterior mid  abdomen at the level of the umbilicus.  2. Unchanged chronic severe circumferential wall thickening of the  distal esophagus, stable since 2021 and possibly reflux related.  Consider further evaluation with endoscopy if not previously  performed.  3.  Aortic Atherosclerosis (ICD10-I70.0).   I agree with the radiologist interpretation   Cardiac Monitoring:  The patient was maintained on a cardiac monitor.  I personally viewed and interpreted the cardiac monitored which showed an underlying rhythm of: nsr   Medicines ordered and prescription drug management:  I ordered medication including ivfs, zofran , morphine   for sx  Reevaluation of the patient after these medicines showed that the patient improved I have reviewed the patients home medicines and have made adjustments as needed   Test Considered:  ct   Critical Interventions:  Ng, pain and nausea control   Consultations Obtained:  I requested consultation with the surgeons ([place]),  and discussed lab and imaging findings as well as pertinent plan - they will see pt in consult; ng recommended.  Request hospitalist admit. Pt d/w Dr. Zella ([facility]) who will admit   Problem List / ED Course:  SBO:  NG placed.  Surgery will consult.  Medicine to admit.   Reevaluation:  After the interventions noted above, I reevaluated the patient and found that they have :improved   Social Determinants of  Health:  SBO   Dispostion:  After consideration of the diagnostic results and the patients response to treatment, I feel that the patent would benefit from admission.          Final Clinical Impression(s) / ED Diagnoses Final diagnoses:  Small bowel obstruction [facility])    Rx / DC Orders ED Discharge Orders     None         Dean Clarity, MD 06/29/23 1110

## 2023-06-29 NOTE — H&P (Signed)
 History and Physical  Christine Hopkins FMW:[id number] DOB: 1956-02-16 DOA: 06/29/2023  PCP: Emilio Joesph DEL, PA-C   Chief Complaint: Abdominal pain, vomiting  HPI: Christine Hopkins is a 68 y.o. female with medical history significant for hypertension, diet-controlled diabetes, GERD, hyperlipidemia being admitted to the hospital with small bowel obstruction.  She had sudden onset of abdominal pain nausea and vomiting last night.  Denies any fever, or bloody emesis.  Evaluation in the ER as detailed below shows evidence of small bowel obstruction.  Orders have been placed for NG tube, and general surgery has been consulted.  Review of Systems: Please see HPI for pertinent positives and negatives. A complete 10 system review of systems are otherwise negative.  Past Medical History:  Diagnosis Date   Allergy    Anxiety    Arthritis    Depression    Diabetes mellitus without complication (HCC)    GERD (gastroesophageal reflux disease)    History of back surgery    Hyperlipidemia    Hypertension    Mild sleep apnea    Neuromuscular disorder (HCC)    OCD (obsessive compulsive disorder) 04/05/2019   Past Surgical History:  Procedure Laterality Date   ESOPHAGEAL MANOMETRY N/A 07/13/2020   Procedure: ESOPHAGEAL MANOMETRY (EM);  Surgeon: Teressa Toribio SQUIBB, MD;  Location: [facility] ENDOSCOPY;  Service: Endoscopy;  Laterality: N/A;   LUMBAR DISC SURGERY     ORIF ANKLE FRACTURE Right    and tibia   Social History:  reports that she has never smoked. She has never used smokeless tobacco. She reports that she does not drink alcohol and does not use drugs.  No Known Allergies  Family History  Problem Relation Age of Onset   Depression Father    Mental illness Sister    Depression Sister    Schizophrenia Sister    Diabetes Brother    Hyperlipidemia Brother    Mental illness Brother    Depression Brother    Schizophrenia Brother    Heart disease Brother    Diabetes Brother    Diabetes Maternal  Grandmother    Heart disease Maternal Grandfather    Heart disease Paternal Grandmother    Colon polyps Neg Hx    Prostate cancer Neg Hx    Rectal cancer Neg Hx    Stomach cancer Neg Hx    Pancreatic cancer Neg Hx      Prior to Admission medications   Medication Sig Start Date End Date Taking? Authorizing Provider  allopurinol  (ZYLOPRIM ) 300 MG tablet Take 1 tablet (300 mg total) by mouth daily. 07/20/22  Yes Hobart Powell BRAVO, MD  aspirin EC 325 MG tablet Take 325 mg by mouth See admin instructions. Take 325 mg by mouth in the morning when not taking the 81 mg strength- Swallow whole.   Yes [provider]  aspirin EC 81 MG tablet Take 81 mg by mouth See admin instructions. Take 81 mg by mouth in the morning when not taking the 325 mg strength- Swallow whole   Yes [provider]  gabapentin  (NEURONTIN ) 100 MG capsule Take 100 mg by mouth 3 (three) times daily as needed (back pain).   Yes [provider]  ibuprofen  (ADVIL ) 800 MG tablet Take 1 tablet (800 mg total) by mouth every 8 (eight) hours as needed (pain). 12/19/21  Yes Banister, Sharlet POUR, MD  lisinopril  (ZESTRIL ) 20 MG tablet TAKE 1 TABLET BY MOUTH  DAILY Patient taking differently: Take 20 mg by mouth in the morning. 08/26/21  Yes Kip Ade, NP  rosuvastatin  (CRESTOR ) 20 MG tablet Take 1 tablet (20 mg total) by mouth daily. Patient taking differently: Take 20 mg by mouth at bedtime. 05/30/22  Yes Pemberton, Powell BRAVO, MD  sertraline  (ZOLOFT ) 100 MG tablet TAKE 1 TABLET BY MOUTH  DAILY Patient taking differently: Take 100 mg by mouth in the morning. 06/24/21  Yes Kip Ade, NP    Physical Exam: BP (!) 166/89 (BP Location: Right Arm)   Pulse 66   Temp 98.4 F (36.9 C) (Oral)   Resp 16   Ht 5' 4 (1.626 m)   Wt 98.4 kg   SpO2 98%   BMI 37.25 kg/m  General:  Alert, oriented, holding her abdomen, in moderate distress from abdominal pain Eyes: EOMI, clear conjuctivae, white sclerea Neck:  supple, no masses, trachea mildline  Cardiovascular: RRR, no murmurs or rubs, no peripheral edema  Respiratory: clear to auscultation bilaterally, no wheezes, no crackles  Abdomen: soft, diffusely tender, moderately distended Skin: dry, no rashes  Musculoskeletal: no joint effusions, normal range of motion  Psychiatric: appropriate affect, normal speech  Neurologic: extraocular muscles intact, clear speech, moving all extremities with intact sensorium         Labs on Admission:  Basic Metabolic Panel: Recent Labs  Lab 06/29/23 0641  NA 140  K 3.7  CL 107  CO2 20*  GLUCOSE 270*  BUN 13  CREATININE 0.91  CALCIUM  9.8   Liver Function Tests: Recent Labs  Lab 06/29/23 0641  AST 26  ALT 16  ALKPHOS 77  BILITOT 0.5  PROT 8.0  ALBUMIN 4.2   Recent Labs  Lab 06/29/23 0641  LIPASE 33   No results for input(s): AMMONIA in the last 168 hours. CBC: Recent Labs  Lab 06/29/23 0641  WBC 10.4  HGB 13.9  HCT 41.7  MCV 84.6  PLT 314   Cardiac Enzymes: No results for input(s): CKTOTAL, CKMB, CKMBINDEX, TROPONINI in the last 168 hours. BNP (last 3 results) No results for input(s): BNP in the last 8760 hours.  ProBNP (last 3 results) No results for input(s): PROBNP in the last 8760 hours.  CBG: No results for input(s): GLUCAP in the last 168 hours.  Radiological Exams on Admission: CT ABDOMEN PELVIS W CONTRAST Result Date: 06/29/2023 CLINICAL DATA:  Acute onset sharp epigastric pain and vomiting after eating last night. EXAM: CT ABDOMEN AND PELVIS WITH CONTRAST TECHNIQUE: Multidetector CT imaging of the abdomen and pelvis was performed using the standard protocol following bolus administration of intravenous contrast. RADIATION DOSE REDUCTION: This exam was performed according to the departmental dose-optimization program which includes automated exposure control, adjustment of the mA and/or kV according to patient size and/or use of iterative reconstruction  technique. CONTRAST:  OMNIPAQUE  IOHEXOL  300 MG/ML  SOLN COMPARISON:  CT abdomen pelvis dated April 12, 2023. FINDINGS: Lower chest: No acute abnormality. Unchanged severe circumferential wall thickening of the distal esophagus. Hepatobiliary: No focal liver abnormality is seen. No gallstones, gallbladder wall thickening, or biliary dilatation. Pancreas: Unremarkable. No pancreatic ductal dilatation or surrounding inflammatory changes. Spleen: Normal in size without focal abnormality. Adrenals/Urinary Tract: Adrenal glands are unremarkable. Kidneys are normal, without renal calculi, focal lesion, or hydronephrosis. Bladder is unremarkable. Stomach/Bowel: Prominently distended stomach. Multiple mildly dilated loops of small bowel with transition point in the anterior mid abdomen at the level of the umbilicus (series 8, image 54). Fecalization small bowel contents proximal to transition point. Decompressed colon with left-sided colonic diverticulosis. Normal appendix. Vascular/Lymphatic: Aortic atherosclerosis.  No enlarged abdominal or pelvic lymph nodes. Reproductive: Uterus and bilateral adnexa are unremarkable. Other: Small amount of free fluid in the pelvis. No pneumoperitoneum. Musculoskeletal: No acute or significant osseous findings. IMPRESSION: 1. Small bowel obstruction with transition point in the anterior mid abdomen at the level of the umbilicus. 2. 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 Consider further evaluation with endoscopy if not previously performed. 3.  Aortic Atherosclerosis (ICD10-I70.0). Electronically Signed   By: Elsie ONEIDA Shoulder M.D.   On: 06/29/2023 10:03   Assessment/Plan Christine Hopkins is a 68 y.o. female with medical history significant for hypertension, diet-controlled diabetes, GERD, hyperlipidemia being admitted to the hospital with small bowel obstruction.   Small bowel  obstruction-with abdominal distention, pain, nausea, and CT findings as above.  Transition point in the anterior mid abdomen to the level of the umbilicus, she had prior oophorectomy several years ago. -Inpatient admission -Keep n.p.o. -NG tube -IV fluids -Pain and nausea control as needed -ER MD has spoken with general surgery, who will see her in consultation  Hypertension-continue home lisinopril , with IV hydralazine  as needed  Depression-Zoloft   Diabetes-historically well-controlled, not currently on any medications.  Will check hemoglobin A1c, placed on sliding scale due to hyperglycemia.  DVT prophylaxis: Lovenox      Code Status: Full Code  Consults called: General Surgery  Admission status: The appropriate patient status for this patient is INPATIENT. Inpatient status is judged to be reasonable and necessary in order to provide the required intensity of service to ensure the patient's safety. The patient's presenting symptoms, physical exam findings, and initial radiographic and laboratory data in the context of their chronic comorbidities is felt to place them at high risk for further clinical deterioration. Furthermore, it is not anticipated that the patient will be medically stable for discharge from the hospital within 2 midnights of admission.    I certify that at the point of admission it is my clinical judgment that the patient will require inpatient hospital care spanning beyond 2 midnights from the point of admission due to high intensity of service, high risk for further deterioration and high frequency of surveillance required  Time spent: 59 minutes  Travor Royce CHRISTELLA Gail MD [facility] Pager [phone]  If 7PM-7AM, please contact night-coverage [email or web address] Password [email or web address]  06/29/2023, 11:33 AM

## 2023-06-29 NOTE — ED Triage Notes (Signed)
 Pt states that she ate some roast beef last night at approx 2000 and immediately had epigastric pain described as sharp. She states she has been vomiting since then and the pain has remained constant

## 2023-06-29 NOTE — Plan of Care (Signed)
  Problem: Education: Goal: Ability to describe self-care measures that may prevent or decrease complications (Diabetes Survival Skills Education) will improve Outcome: Progressing Goal: Individualized Educational Video(s) Outcome: Progressing   Problem: Coping: Goal: Ability to adjust to condition or change in health will improve Outcome: Progressing   Problem: Fluid Volume: Goal: Ability to maintain a balanced intake and output will improve Outcome: Progressing   Problem: Health Behavior/Discharge Planning: Goal: Ability to identify and utilize available resources and services will improve Outcome: Progressing Goal: Ability to manage health-related needs will improve Outcome: Progressing   Problem: Metabolic: Goal: Ability to maintain appropriate glucose levels will improve Outcome: Progressing   Problem: Nutritional: Goal: Maintenance of adequate nutrition will improve Outcome: Progressing Goal: Progress toward achieving an optimal weight will improve Outcome: Progressing   Problem: Skin Integrity: Goal: Risk for impaired skin integrity will decrease Outcome: Progressing   Problem: Tissue Perfusion: Goal: Adequacy of tissue perfusion will improve Outcome: Progressing   Problem: Education: Goal: Knowledge of General Education information will improve Description: Including pain rating scale, medication(s)/side effects and non-pharmacologic comfort measures Outcome: Progressing   Problem: Health Behavior/Discharge Planning: Goal: Ability to manage health-related needs will improve Outcome: Progressing   Problem: Clinical Measurements: Goal: Ability to maintain clinical measurements within normal limits will improve Outcome: Progressing Goal: Will remain free from infection Outcome: Progressing Goal: Diagnostic test results will improve Outcome: Progressing Goal: Respiratory complications will improve Outcome: Progressing Goal: Cardiovascular complication will  be avoided Outcome: Progressing   Problem: Activity: Goal: Risk for activity intolerance will decrease Outcome: Progressing   Problem: Nutrition: Goal: Adequate nutrition will be maintained Outcome: Progressing   Problem: Coping: Goal: Level of anxiety will decrease Outcome: Progressing   Problem: Elimination: Goal: Will not experience complications related to bowel motility Outcome: Progressing Goal: Will not experience complications related to urinary retention Outcome: Progressing   Problem: Pain Management: Goal: General experience of comfort will improve Outcome: Progressing   Problem: Safety: Goal: Ability to remain free from injury will improve Outcome: Progressing   Problem: Skin Integrity: Goal: Risk for impaired skin integrity will decrease Outcome: Progressing   Problem: Education: Goal: Knowledge of General Education information will improve Description: Including pain rating scale, medication(s)/side effects and non-pharmacologic comfort measures Outcome: Progressing   Problem: Health Behavior/Discharge Planning: Goal: Ability to manage health-related needs will improve Outcome: Progressing   Problem: Clinical Measurements: Goal: Ability to maintain clinical measurements within normal limits will improve Outcome: Progressing Goal: Will remain free from infection Outcome: Progressing Goal: Diagnostic test results will improve Outcome: Progressing Goal: Respiratory complications will improve Outcome: Progressing Goal: Cardiovascular complication will be avoided Outcome: Progressing   Problem: Activity: Goal: Risk for activity intolerance will decrease Outcome: Progressing   Problem: Nutrition: Goal: Adequate nutrition will be maintained Outcome: Progressing   Problem: Coping: Goal: Level of anxiety will decrease Outcome: Progressing   Problem: Elimination: Goal: Will not experience complications related to bowel motility Outcome:  Progressing Goal: Will not experience complications related to urinary retention Outcome: Progressing   Problem: Pain Management: Goal: General experience of comfort will improve Outcome: Progressing   Problem: Safety: Goal: Ability to remain free from injury will improve Outcome: Progressing   Problem: Skin Integrity: Goal: Risk for impaired skin integrity will decrease Outcome: Progressing

## 2023-06-30 ENCOUNTER — Inpatient Hospital Stay (HOSPITAL_COMMUNITY): Payer: Medicare Other

## 2023-06-30 DIAGNOSIS — K56609 Unspecified intestinal obstruction, unspecified as to partial versus complete obstruction: Secondary | ICD-10-CM | POA: Diagnosis not present

## 2023-06-30 LAB — CBC
HCT: 39.9 % (ref 36.0–46.0)
Hemoglobin: 12.4 g/dL (ref 12.0–15.0)
MCH: 27.9 pg (ref 26.0–34.0)
MCHC: 31.1 g/dL (ref 30.0–36.0)
MCV: 89.9 fL (ref 80.0–100.0)
Platelets: 247 10*3/uL (ref 150–400)
RBC: 4.44 MIL/uL (ref 3.87–5.11)
RDW: 15.9 % — ABNORMAL HIGH (ref 11.5–15.5)
WBC: 10.6 10*3/uL — ABNORMAL HIGH (ref 4.0–10.5)
nRBC: 0 % (ref 0.0–0.2)

## 2023-06-30 LAB — BASIC METABOLIC PANEL
Anion gap: 7 (ref 5–15)
BUN: 11 mg/dL (ref 8–23)
CO2: 27 mmol/L (ref 22–32)
Calcium: 9.1 mg/dL (ref 8.9–10.3)
Chloride: 111 mmol/L (ref 98–111)
Creatinine, Ser: 0.59 mg/dL (ref 0.44–1.00)
GFR, Estimated: >60 mL/min (ref >60)
Glucose, Bld: 136 mg/dL — ABNORMAL HIGH (ref 70–99)
Potassium: 3.8 mmol/L (ref 3.5–5.1)
Sodium: 145 mmol/L (ref 135–145)

## 2023-06-30 LAB — GLUCOSE, CAPILLARY
Glucose-Capillary: 109 mg/dL — ABNORMAL HIGH (ref 70–99)
Glucose-Capillary: 112 mg/dL — ABNORMAL HIGH (ref 70–99)
Glucose-Capillary: 121 mg/dL — ABNORMAL HIGH (ref 70–99)
Glucose-Capillary: 123 mg/dL — ABNORMAL HIGH (ref 70–99)
Glucose-Capillary: 76 mg/dL (ref 70–99)
Glucose-Capillary: 89 mg/dL (ref 70–99)

## 2023-06-30 MED ORDER — ACETAMINOPHEN 10 MG/ML IV SOLN
1000.0000 mg | Freq: Four times a day (QID) | INTRAVENOUS | Status: AC
  Administered 2023-06-30 – 2023-07-01 (×4): 1000 mg via INTRAVENOUS
  Filled 2023-06-30 (×4): qty 100

## 2023-06-30 MED ORDER — DIATRIZOATE MEGLUMINE & SODIUM 66-10 % PO SOLN
90.0000 mL | Freq: Once | ORAL | Status: AC
  Administered 2023-06-30: 90 mL via NASOGASTRIC
  Filled 2023-06-30: qty 90

## 2023-06-30 MED ORDER — PANTOPRAZOLE SODIUM 40 MG IV SOLR
40.0000 mg | INTRAVENOUS | Status: DC
  Administered 2023-06-30 – 2023-07-03 (×4): 40 mg via INTRAVENOUS
  Filled 2023-06-30 (×4): qty 10

## 2023-06-30 MED ORDER — LACTATED RINGERS IV SOLN: INTRAVENOUS | Status: AC

## 2023-06-30 MED ORDER — KCL IN DEXTROSE-NACL 20-5-0.9 MEQ/L-%-% IV SOLN
INTRAVENOUS | Status: AC
  Filled 2023-06-30: qty 1000

## 2023-06-30 NOTE — Progress Notes (Signed)
 PROGRESS NOTE  Christine Hopkins FMW:[id number] DOB: 07/13/1955 DOA: 06/29/2023 PCP: Emilio Joesph DEL, PA-C   LOS: 1 day   Brief narrative:  Christine Hopkins is a 68 y.o. 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  In the ED patient was noted to be hypertensive with mild tachycardia.  Initial labs showed hyperglycemia lipase of 33 urinalysis negative for WBC.  Hemoglobin A1c was 6.6.  HIV was nonreactive.  CT scan of the abdomen and pelvis done in the ED showed small bowel obstruction in the anterior mid abdomen at the level of umbilicus with thickening of the distal esophagus.  Patient was then considered for admission to hospital for bowel obstruction.  General surgery was consulted.  Assessment and plan.  Small bowel obstruction- with abdominal distention, pain, nausea, and CT findings of bowel obstruction with transition point in the anterior mid abdomen to the level of the umbilicus, she had prior oophorectomy several years ago.  Continue n.p.o with low intermittent suction,NG tube IV fluids analgesics antiemetics.  Follow-up General Surgery recommendations.   Hypertension-on lisinopril  at home.  Continue IV hydralazine .  Blood pressure improved.   Depression-on Zoloft  home.  Currently on hold.   Diabetes melitus-hemoglobin A1c of 6.6.  On sliding scale insulin .    GERD Add PPI.  Hyperlipidemia On Crestor  at home.  Will hold for now.  Grade 2 obesity. Body mass index is 37.25 kg/m.   Might benefit from weight loss as outpatient    DVT prophylaxis: enoxaparin  (LOVENOX ) injection 40 mg Start: 06/29/23 1200 SCDs Start: 06/29/23 1129   Disposition: Home likely in 1 to 2 days  Status is: Inpatient Remains inpatient appropriate because: Bowel obstruction, pending clinical improvement    Code Status:     Code  Status: Full Code  Family Communication: None at bedside  Consultants: General surgery  Procedures: NG tube  Anti-infectives:  None  Anti-infectives (From admission, onward)    None        Subjective: Today, patient was seen and examined at bedside.  Complains of heartburn.  Denies overt pain and pain has improved some..  No vomiting or bowel movement.  Mild nausea.  Feels that she has some regurgitation and heartburn.  Objective: Vitals:   06/30/23 0024 06/30/23 0603  BP: 137/71 133/79  Pulse: 68 62  Resp: 16 17  Temp: 97.6 F (36.4 C) 98 F (36.7 C)  SpO2: 100% 98%    Intake/Output Summary (Last 24 hours) at 06/30/2023 0854 Last data filed at 06/30/2023 0300 Gross per 24 hour  Intake 1892.25 ml  Output 800 ml  Net 1092.25 ml   Filed Weights   06/29/23 0624  Weight: 98.4 kg   Body mass index is 37.25 kg/m.   Physical Exam: GENERAL: Patient is alert awake and oriented. Not in obvious distress.,  Obese built, NG tube in place HENT: No scleral pallor or icterus. Pupils equally reactive to light. Oral mucosa is moist NECK: is supple, no gross swelling noted. CHEST: Clear to auscultation.  Diminished breath sounds bilaterally. CVS: S1 and S2 heard, no murmur. Regular rate and rhythm.  ABDOMEN: Soft, distended with no bowel sounds heard.,  Nonspecific tenderness EXTREMITIES: No edema. CNS: Cranial nerves are intact. No focal motor deficits. SKIN: warm and dry without rashes.  Data Review: I have personally reviewed the following laboratory data and studies,  CBC: Recent Labs  Lab  06/29/23 0641 06/30/23 0604  WBC 10.4 10.6*  HGB 13.9 12.4  HCT 41.7 39.9  MCV 84.6 89.9  PLT 314 247   Basic Metabolic Panel: Recent Labs  Lab 06/29/23 0641 06/30/23 0604  NA 140 145  K 3.7 3.8  CL 107 111  CO2 20* 27  GLUCOSE 270* 136*  BUN 13 11  CREATININE 0.91 0.59  CALCIUM  9.8 9.1   Liver Function Tests: Recent Labs  Lab 06/29/23 0641  AST 26  ALT  16  ALKPHOS 77  BILITOT 0.5  PROT 8.0  ALBUMIN 4.2   Recent Labs  Lab 06/29/23 0641  LIPASE 33   No results for input(s): AMMONIA in the last 168 hours. Cardiac Enzymes: No results for input(s): CKTOTAL, CKMB, CKMBINDEX, TROPONINI in the last 168 hours. BNP (last 3 results) No results for input(s): BNP in the last 8760 hours.  ProBNP (last 3 results) No results for input(s): PROBNP in the last 8760 hours.  CBG: Recent Labs  Lab 06/29/23 1315 06/29/23 1640 06/29/23 2040 06/30/23 0022 06/30/23 0604  GLUCAP 130* 106* 111* 123* 121*   No results found for this or any previous visit (from the past 240 hours).   Studies: DG Abd Portable 1 View Result Date: 06/29/2023 CLINICAL DATA:  Nasogastric tube placement. EXAM: PORTABLE ABDOMEN - 1 VIEW COMPARISON:  None Available. FINDINGS: The bowel gas pattern is non-obstructive. No evidence of pneumoperitoneum. No acute osseous abnormalities. The soft tissues are within normal limits. Surgical changes, devices, tubes and lines: Interval placement of enteric tube. The tip overlies the left upper quadrant, overlying the proximal stomach region. However, the side hole is just above the level of left hemidiaphragm, in the lower thoracic esophagus. Consider further advancement by approximately 4 inches to confidently put the side-hole into the stomach. IMPRESSION: *Interval placement of enteric tube. The tip overlies the left upper quadrant, overlying the proximal stomach region. However, the side hole is just above the level of left hemidiaphragm, in the lower thoracic esophagus. Consider further advancement by approximately 4 inches to confidently put the side-hole into the stomach. Electronically Signed   By: Ree Molt M.D.   On: 06/29/2023 14:02   CT ABDOMEN PELVIS W CONTRAST Result Date: 06/29/2023 CLINICAL DATA:  Acute onset sharp epigastric pain and vomiting after eating last night. EXAM: CT ABDOMEN AND PELVIS WITH CONTRAST  TECHNIQUE: Multidetector CT imaging of the abdomen and pelvis was performed using the standard protocol following bolus administration of intravenous contrast. RADIATION DOSE REDUCTION: This exam was performed according to the departmental dose-optimization program which includes automated exposure control, adjustment of the mA and/or kV according to patient size and/or use of iterative reconstruction technique. CONTRAST:  OMNIPAQUE  IOHEXOL  300 MG/ML  SOLN COMPARISON:  CT abdomen pelvis dated April 12, 2023. FINDINGS: Lower chest: No acute abnormality. Unchanged severe circumferential wall thickening of the distal esophagus. Hepatobiliary: No focal liver abnormality is seen. No gallstones, gallbladder wall thickening, or biliary dilatation. Pancreas: Unremarkable. No pancreatic ductal dilatation or surrounding inflammatory changes. Spleen: Normal in size without focal abnormality. Adrenals/Urinary Tract: Adrenal glands are unremarkable. Kidneys are normal, without renal calculi, focal lesion, or hydronephrosis. Bladder is unremarkable. Stomach/Bowel: Prominently distended stomach. Multiple mildly dilated loops of small bowel with transition point in the anterior mid abdomen at the level of the umbilicus (series 8, image 54). Fecalization small bowel contents proximal to transition point. Decompressed colon with left-sided colonic diverticulosis. Normal appendix. Vascular/Lymphatic: Aortic atherosclerosis. No enlarged abdominal or pelvic lymph nodes. Reproductive:  Uterus and bilateral adnexa are unremarkable. Other: Small amount of free fluid in the pelvis. No pneumoperitoneum. Musculoskeletal: No acute or significant osseous findings. IMPRESSION: 1. Small bowel obstruction with transition point in the anterior mid abdomen at the level of the umbilicus. 2. 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 Consider further  evaluation with endoscopy if not previously performed. 3.  Aortic Atherosclerosis (ICD10-I70.0). Electronically Signed   By: Elsie ONEIDA Shoulder M.D.   On: 06/29/2023 10:03      Vernal Alstrom, MD  [facility] 06/30/2023  If 7PM-7AM, please contact night-coverage

## 2023-06-30 NOTE — Progress Notes (Signed)
 NG tube initially at 52 cm, advised this AM by Carl Best, PA to advance to 58 cm. Tube now advanced.

## 2023-06-30 NOTE — TOC Initial Note (Signed)
 Transition of Care [facility]) - Initial/Assessment Note    Patient Details  Name: Christine Hopkins MRN: [id number] Date of Birth: [birth year]  [facility] CM/SW Contact:    Alfonse JONELLE Rex, RN Phone Number: 06/30/2023, 11:42 AM  Clinical Narrative:   Met with pt at bedside to introduce role of TOC/NCM and review for dc planning, pt reports she has an established PCP and pharmacy, no current home care services or home DME, reports she feels safe returning home with support from her daughter, confirmed transportation is available at discharge. TOC will continue to follow.                 Expected Discharge Plan: Home/Self Care Barriers to Discharge: Continued Medical Work up   Patient Goals and CMS Choice Patient states their goals for this hospitalization and ongoing recovery are:: return home with support from her daughter          Expected Discharge Plan and Services       Living arrangements for the past 2 months: Single Family Home                                      Prior Living Arrangements/Services Living arrangements for the past 2 months: Single Family Home Lives with:: Adult Children Patient language and need for interpreter reviewed:: Yes Do you feel safe going back to the place where you live?: Yes      Need for Family Participation in Patient Care: Yes (Comment)        Activities of Daily Living   ADL Screening (condition at time of admission) Independently performs ADLs?: Yes (appropriate for developmental age) Is the patient deaf or have difficulty hearing?: No Does the patient have difficulty seeing, even when wearing glasses/contacts?: No Does the patient have difficulty concentrating, remembering, or making decisions?: No  Permission Sought/Granted                  Emotional Assessment Appearance:: Appears stated age Attitude/Demeanor/Rapport: Gracious Affect (typically observed): Accepting Orientation: : Oriented to Self,  Oriented to Place, Oriented to  Time, Oriented to Situation Alcohol / Substance Use: Not Applicable Psych Involvement: No (comment)  Admission diagnosis:  Small bowel obstruction (HCC) [K56.609] SBO (small bowel obstruction) (HCC) [K56.609] Patient Active Problem List   Diagnosis Date Noted   SBO (small bowel obstruction) (HCC) 06/29/2023   OCD (obsessive compulsive disorder) 04/05/2019   Type 2 diabetes mellitus with hyperglycemia, without long-term current use of insulin  (HCC) 02/18/2019   History of back surgery 11/10/2016   Essential hypertension 07/15/2016   Dyslipidemia 07/15/2016   Class 2 obesity due to excess calories with serious comorbidity and body mass index (BMI) of 36.0 to 36.9 in adult 07/15/2016   Adjustment disorder with depressed mood 07/15/2016   Esophageal dysphagia 06/26/2015   PCP:  Emilio Joesph DEL, PA-C Pharmacy:   [facility] [phone] [address], [place] - [address] AT [employer] [address] [place] [address] Phone: [phone] Fax: [phone]     Social Drivers of Health (SDOH) Social History: SDOH Screenings   Food Insecurity: No Food Insecurity (06/29/2023)  Housing: Low Risk  (06/29/2023)  Transportation Needs: No Transportation Needs (06/29/2023)  Utilities: Not At Risk (06/29/2023)  Alcohol Screen: Low Risk  (12/28/2021)  Depression (PHQ2-9): Low Risk  (12/28/2021)  Financial Resource Strain: Low Risk  (03/30/2023)   Received from  [facility]  Physical Activity: Insufficiently Active (12/28/2021)  Social Connections: Moderately Integrated (06/29/2023)  Stress: No Stress Concern Present (12/28/2021)  Tobacco Use: Low Risk  (06/29/2023)   SDOH Interventions:     Readmission Risk Interventions    06/30/2023   11:41 AM  Readmission Risk Prevention Plan  Post Dischage Appt Complete  Medication Screening Complete  Transportation Screening Complete

## 2023-06-30 NOTE — Progress Notes (Signed)
 Progress Note     Subjective: She states she has felt a lot better since NGT placed - she says they got a lot out. Abdominal pain improved but she is also taking pain medications. She is a little nauseas but thinks this is related to dilaudid . She has a headache. She has walked once. No flatus or BM. She is having some regurgitation but this is at baseline with her spasms  Objective: Vital signs in last 24 hours: Temp:  [97.6 F (36.4 C)-98.4 F (36.9 C)] 98 F (36.7 C) (01/10 0603) Pulse Rate:  [59-68] 62 (01/10 0603) Resp:  [15-18] 17 (01/10 0603) BP: (133-179)/(71-91) 133/79 (01/10 0603) SpO2:  [95 %-100 %] 98 % (01/10 0603) Last BM Date : 06/27/23  Intake/Output from previous day: 01/09 0701 - 01/10 0700 In: 1892.3 [I.V.:892.3; IV Piggyback:1000] Out: 800  Intake/Output this shift: No intake/output data recorded.  PE: General: pleasant, WD, female who is laying in bed in NAD HEENT: head is normocephalic, atraumatic.  Sclera are noninjected.  Pupils equal and round. EOMs intact.  Ears and nose without any masses or lesions.  Mouth is pink and moist Lungs:  Respiratory effort nonlabored Abd: soft, NT, mild distension. Scant bilious output in NGT canister  MSK: all 4 extremities are symmetrical with no cyanosis, clubbing, or edema. Skin: warm and dry Psych: A&Ox3 with an appropriate affect.    Lab Results:  Recent Labs    06/29/23 0641 06/30/23 0604  WBC 10.4 10.6*  HGB 13.9 12.4  HCT 41.7 39.9  PLT 314 247   BMET Recent Labs    06/29/23 0641 06/30/23 0604  NA 140 145  K 3.7 3.8  CL 107 111  CO2 20* 27  GLUCOSE 270* 136*  BUN 13 11  CREATININE 0.91 0.59  CALCIUM  9.8 9.1   PT/INR No results for input(s): LABPROT, INR in the last 72 hours. CMP     Component Value Date/Time   NA 145 06/30/2023 0604   NA 145 (H) 05/12/2020 1004   K 3.8 06/30/2023 0604   CL 111 06/30/2023 0604   CO2 27 06/30/2023 0604   GLUCOSE 136 (H) 06/30/2023 0604   BUN 11  06/30/2023 0604   BUN 13 05/12/2020 1004   CREATININE 0.59 06/30/2023 0604   CALCIUM  9.1 06/30/2023 0604   PROT 8.0 06/29/2023 0641   PROT 6.7 01/28/2020 1611   ALBUMIN 4.2 06/29/2023 0641   ALBUMIN 4.2 01/28/2020 1611   AST 26 06/29/2023 0641   ALT 16 06/29/2023 0641   ALKPHOS 77 06/29/2023 0641   BILITOT 0.5 06/29/2023 0641   BILITOT 0.2 01/28/2020 1611   GFRNONAA >60 06/30/2023 0604   GFRAA 96 05/12/2020 1004   Lipase     Component Value Date/Time   LIPASE 33 06/29/2023 0641       Studies/Results: DG Abd Portable 1 View Result Date: 06/29/2023 CLINICAL DATA:  Nasogastric tube placement. EXAM: PORTABLE ABDOMEN - 1 VIEW COMPARISON:  None Available. FINDINGS: The bowel gas pattern is non-obstructive. No evidence of pneumoperitoneum. No acute osseous abnormalities. The soft tissues are within normal limits. Surgical changes, devices, tubes and lines: Interval placement of enteric tube. The tip overlies the left upper quadrant, overlying the proximal stomach region. However, the side hole is just above the level of left hemidiaphragm, in the lower thoracic esophagus. Consider further advancement by approximately 4 inches to confidently put the side-hole into the stomach. IMPRESSION: *Interval placement of enteric tube. The tip overlies the left upper quadrant,  overlying the proximal stomach region. However, the side hole is just above the level of left hemidiaphragm, in the lower thoracic esophagus. Consider further advancement by approximately 4 inches to confidently put the side-hole into the stomach. Electronically Signed   By: Ree Molt M.D.   On: 06/29/2023 14:02   CT ABDOMEN PELVIS W CONTRAST Result Date: 06/29/2023 CLINICAL DATA:  Acute onset sharp epigastric pain and vomiting after eating last night. EXAM: CT ABDOMEN AND PELVIS WITH CONTRAST TECHNIQUE: Multidetector CT imaging of the abdomen and pelvis was performed using the standard protocol following bolus administration of  intravenous contrast. RADIATION DOSE REDUCTION: This exam was performed according to the departmental dose-optimization program which includes automated exposure control, adjustment of the mA and/or kV according to patient size and/or use of iterative reconstruction technique. CONTRAST:  OMNIPAQUE  IOHEXOL  300 MG/ML  SOLN COMPARISON:  CT abdomen pelvis dated April 12, 2023. FINDINGS: Lower chest: No acute abnormality. Unchanged severe circumferential wall thickening of the distal esophagus. Hepatobiliary: No focal liver abnormality is seen. No gallstones, gallbladder wall thickening, or biliary dilatation. Pancreas: Unremarkable. No pancreatic ductal dilatation or surrounding inflammatory changes. Spleen: Normal in size without focal abnormality. Adrenals/Urinary Tract: Adrenal glands are unremarkable. Kidneys are normal, without renal calculi, focal lesion, or hydronephrosis. Bladder is unremarkable. Stomach/Bowel: Prominently distended stomach. Multiple mildly dilated loops of small bowel with transition point in the anterior mid abdomen at the level of the umbilicus (series 8, image 54). Fecalization small bowel contents proximal to transition point. Decompressed colon with left-sided colonic diverticulosis. Normal appendix. Vascular/Lymphatic: Aortic atherosclerosis. No enlarged abdominal or pelvic lymph nodes. Reproductive: Uterus and bilateral adnexa are unremarkable. Other: Small amount of free fluid in the pelvis. No pneumoperitoneum. Musculoskeletal: No acute or significant osseous findings. IMPRESSION: 1. Small bowel obstruction with transition point in the anterior mid abdomen at the level of the umbilicus. 2. 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 Consider further evaluation with endoscopy if not previously performed. 3.  Aortic Atherosclerosis (ICD10-I70.0). Electronically Signed   By: Elsie ONEIDA Shoulder M.D.    On: 06/29/2023 10:03    Anti-infectives: Anti-infectives (From admission, onward)    None        Assessment/Plan SBO Possibly adhesive in setting of prior abd surgery - CT w/ Small bowel obstruction with transition point in the anterior mid abdomen at the level of the umbilicus. - No current indication for emergency surgery - 800 ml charted as other - discussed with RN and think this may have been NGT output but unfortunately it remains unclear - continue NGT to LIWS this am. Advance NGT - discussed with RN. Await abd xray this am and how output progresses with NGT advancement prior to starting SBO protocol - no PO meds until she is tolerating a diet - Keep K > 4 and Mg > 2 for bowel function - Mobilize for bowel function - Hopefully patient will improve with conservative management. If patient fails to improve with conservative management, she may require exploratory surgery during admission.    FEN: NPO, NGT LIWS ID: none VTE: lovenox   I reviewed hospitalist notes, last 24 h vitals and pain scores, last 48 h intake and output, last 24 h labs and trends, and last 24 h imaging results.    LOS: 1 day   [place] VEAR Mais, [facility] 06/30/2023, 8:08 AM Please see Amion for pager number during day hours 7:00am-4:30pm

## 2023-06-30 NOTE — Progress Notes (Signed)
 Mobility Specialist - Progress Note   06/30/23 1347  Mobility  Activity Ambulated with assistance in hallway  Level of Assistance Standby assist, set-up cues, supervision of patient - no hands on  Assistive Device Front wheel walker  Distance Ambulated (ft) 250 ft  Activity Response Tolerated well  Mobility Referral Yes  Mobility visit 1 Mobility  Mobility Specialist Start Time (ACUTE ONLY) 1111  Mobility Specialist Stop Time (ACUTE ONLY) 1132  Mobility Specialist Time Calculation (min) (ACUTE ONLY) 21 min   Pt received in bed and agreeable to mobility. No complaints during session. Pt to recliner after session with all needs met.    [facility]

## 2023-06-30 NOTE — Plan of Care (Signed)

## 2023-07-01 ENCOUNTER — Inpatient Hospital Stay (HOSPITAL_COMMUNITY): Payer: Medicare Other

## 2023-07-01 DIAGNOSIS — K56609 Unspecified intestinal obstruction, unspecified as to partial versus complete obstruction: Secondary | ICD-10-CM | POA: Diagnosis not present

## 2023-07-01 LAB — GLUCOSE, CAPILLARY
Glucose-Capillary: 102 mg/dL — ABNORMAL HIGH (ref 70–99)
Glucose-Capillary: 104 mg/dL — ABNORMAL HIGH (ref 70–99)
Glucose-Capillary: 104 mg/dL — ABNORMAL HIGH (ref 70–99)
Glucose-Capillary: 113 mg/dL — ABNORMAL HIGH (ref 70–99)
Glucose-Capillary: 121 mg/dL — ABNORMAL HIGH (ref 70–99)
Glucose-Capillary: 140 mg/dL — ABNORMAL HIGH (ref 70–99)
Glucose-Capillary: 87 mg/dL (ref 70–99)

## 2023-07-01 LAB — CBC
HCT: 35.4 % — ABNORMAL LOW (ref 36.0–46.0)
Hemoglobin: 11.2 g/dL — ABNORMAL LOW (ref 12.0–15.0)
MCH: 28.4 pg (ref 26.0–34.0)
MCHC: 31.6 g/dL (ref 30.0–36.0)
MCV: 89.6 fL (ref 80.0–100.0)
Platelets: 206 10*3/uL (ref 150–400)
RBC: 3.95 MIL/uL (ref 3.87–5.11)
RDW: 15.6 % — ABNORMAL HIGH (ref 11.5–15.5)
WBC: 8.5 10*3/uL (ref 4.0–10.5)
nRBC: 0 % (ref 0.0–0.2)

## 2023-07-01 LAB — BASIC METABOLIC PANEL
Anion gap: 8 (ref 5–15)
BUN: 10 mg/dL (ref 8–23)
CO2: 26 mmol/L (ref 22–32)
Calcium: 8.6 mg/dL — ABNORMAL LOW (ref 8.9–10.3)
Chloride: 111 mmol/L (ref 98–111)
Creatinine, Ser: 0.62 mg/dL (ref 0.44–1.00)
GFR, Estimated: >60 mL/min (ref >60)
Glucose, Bld: 110 mg/dL — ABNORMAL HIGH (ref 70–99)
Potassium: 3.5 mmol/L (ref 3.5–5.1)
Sodium: 145 mmol/L (ref 135–145)

## 2023-07-01 LAB — MAGNESIUM: Magnesium: 2 mg/dL (ref 1.7–2.4)

## 2023-07-01 LAB — PHOSPHORUS: Phosphorus: 3.1 mg/dL (ref 2.5–4.6)

## 2023-07-01 NOTE — Progress Notes (Signed)
 PROGRESS NOTE  Christine Hopkins FMW:[id number] DOB: 03-08-1956 DOA: 06/29/2023 PCP: Emilio Joesph DEL, PA-C   LOS: 2 days   Brief narrative:  Christine Hopkins is a 68 y.o. 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  In the ED patient was noted to be hypertensive with mild tachycardia.  Initial labs showed hyperglycemia lipase of 33 urinalysis negative for WBC.  Hemoglobin A1c was 6.6.  HIV was nonreactive.  CT scan of the abdomen and pelvis done in the ED showed small bowel obstruction in the anterior mid abdomen at the level of umbilicus with thickening of the distal esophagus. General surgery was consulted and patient was then considered for admission to hospital for bowel obstruction  Assessment and plan.  Small bowel obstruction- with abdominal distention, pain, nausea, and CT findings of bowel obstruction with transition point in the anterior mid abdomen to the level of the umbilicus on presentation.  Patient with prior oophorectomy several years ago.  Initially received NG tube IV fluids n.p.o. analgesics and antiemetics with improvement.  Small bowel follow-through with contrast down in the bowel.  General surgery has seen the patient today and has initiated on clears.  Will follow recommendations.  Off NG tube at this time.   Hypertension-on lisinopril  at home.  Continue IV hydralazine .  Blood pressure improved.   Depression-on Zoloft  home.  Currently on hold.   Diabetes melitus-hemoglobin A1c of 6.6.  On sliding scale insulin .  Continue to monitor.  On D5 normal saline with 20 mEq of potassium.  Will closely monitor and discontinue once her p.o. is adequate.  GERD Continue PPI.  Hyperlipidemia On Crestor  at home.  Will hold for now.  Grade 2 obesity. Body mass index is 37.25 kg/m.   Might benefit from weight loss  as outpatient    DVT prophylaxis: enoxaparin  (LOVENOX ) injection 40 mg Start: 06/29/23 1200 SCDs Start: 06/29/23 1129   Disposition: Home likely in 1 to 2 days, will encourage ambulation.  Status is: Inpatient  Remains inpatient appropriate because: Bowel obstruction, pending clinical improvement    Code Status:     Code Status: Full Code  Family Communication: None at bedside  Consultants: General surgery  Procedures: NG tube  Anti-infectives:  None  Anti-infectives (From admission, onward)    None       Subjective: Today, patient was seen and examined at bedside.  States that she does have some spasm from NG tube.  Wishes to drink more.  Has had 2 bowel movements.  Denies any nausea vomiting.  Objective: Vitals:   07/01/23 0515 07/01/23 0853  BP: (!) 148/78 (!) 148/72  Pulse: (!) 55   Resp: 14   Temp: 98.3 F (36.8 C)   SpO2: 95%     Intake/Output Summary (Last 24 hours) at 07/01/2023 0904 Last data filed at 07/01/2023 9379 Gross per 24 hour  Intake 2665.49 ml  Output 500 ml  Net 2165.49 ml   Filed Weights   06/29/23 0624  Weight: 98.4 kg   Body mass index is 37.25 kg/m.   Physical Exam:  GENERAL: Patient is alert awake and oriented. Not in obvious distress.,  Obese built, HENT: No scleral pallor or icterus. Pupils equally reactive to light. Oral mucosa is moist NECK: is supple, no gross swelling noted. CHEST: Clear to auscultation.  Diminished breath sounds bilaterally. CVS: S1 and S2 heard, no murmur. Regular rate  and rhythm.  ABDOMEN: Soft, distended with some bowel sounds.,  Nonspecific tenderness EXTREMITIES: No edema. CNS: Cranial nerves are intact. No focal motor deficits. SKIN: warm and dry without rashes.  Data Review: I have personally reviewed the following laboratory data and studies,  CBC: Recent Labs  Lab 06/29/23 0641 06/30/23 0604  WBC 10.4 10.6*  HGB 13.9 12.4  HCT 41.7 39.9  MCV 84.6 89.9  PLT 314 247   Basic  Metabolic Panel: Recent Labs  Lab 06/29/23 0641 06/30/23 0604  NA 140 145  K 3.7 3.8  CL 107 111  CO2 20* 27  GLUCOSE 270* 136*  BUN 13 11  CREATININE 0.91 0.59  CALCIUM  9.8 9.1   Liver Function Tests: Recent Labs  Lab 06/29/23 0641  AST 26  ALT 16  ALKPHOS 77  BILITOT 0.5  PROT 8.0  ALBUMIN 4.2   Recent Labs  Lab 06/29/23 0641  LIPASE 33   No results for input(s): AMMONIA in the last 168 hours. Cardiac Enzymes: No results for input(s): CKTOTAL, CKMB, CKMBINDEX, TROPONINI in the last 168 hours. BNP (last 3 results) No results for input(s): BNP in the last 8760 hours.  ProBNP (last 3 results) No results for input(s): PROBNP in the last 8760 hours.  CBG: Recent Labs  Lab 06/30/23 1655 06/30/23 2115 07/01/23 0111 07/01/23 0524 07/01/23 0744  GLUCAP 76 112* 104* 113* 121*   No results found for this or any previous visit (from the past 240 hours).   Studies: DG Abd Portable 1V-Small Bowel Obstruction Protocol-initial, 8 hr delay Result Date: 06/30/2023 CLINICAL DATA:  Small-bowel obstruction 8 hour delay EXAM: PORTABLE ABDOMEN - 1 VIEW COMPARISON:  06/30/2023, 06/29/2023, CT 06/29/2023 FINDINGS: Enteric tube tip overlies the stomach. Enteral contrast within the stomach. Dilute contrast visible within central dilated small bowel loops measuring up to 5.1 cm. Enteral contrast is present within the colon and rectum. IMPRESSION: Enteral contrast within the colon and rectum. Persistent dilated central small bowel containing dilute contrast Electronically Signed   By: Luke Bun M.D.   On: 06/30/2023 22:48   DG Abd Portable 1V Result Date: 06/30/2023 CLINICAL DATA:  Small bowel obstruction. EXAM: PORTABLE ABDOMEN - 1 VIEW COMPARISON:  CT abdomen pelvis dated 06/29/2023. FINDINGS: Enteric tube with side-port in the region of the GE junction and tip in the proximal stomach. Recommend further advancing by additional 7 cm. Dilated small bowel loop in the  upper abdomen measures approximately 4.5 cm in caliber. IMPRESSION: 1. Enteric tube with tip in the proximal stomach. Recommend further advancing by additional 7 cm. 2. Small bowel dilatation. Electronically Signed   By: Vanetta Chou M.D.   On: 06/30/2023 09:38   DG Abd Portable 1 View Result Date: 06/29/2023 CLINICAL DATA:  Nasogastric tube placement. EXAM: PORTABLE ABDOMEN - 1 VIEW COMPARISON:  None Available. FINDINGS: The bowel gas pattern is non-obstructive. No evidence of pneumoperitoneum. No acute osseous abnormalities. The soft tissues are within normal limits. Surgical changes, devices, tubes and lines: Interval placement of enteric tube. The tip overlies the left upper quadrant, overlying the proximal stomach region. However, the side hole is just above the level of left hemidiaphragm, in the lower thoracic esophagus. Consider further advancement by approximately 4 inches to confidently put the side-hole into the stomach. IMPRESSION: *Interval placement of enteric tube. The tip overlies the left upper quadrant, overlying the proximal stomach region. However, the side hole is just above the level of left hemidiaphragm, in the lower thoracic esophagus. Consider further advancement  by approximately 4 inches to confidently put the side-hole into the stomach. Electronically Signed   By: Ree Molt M.D.   On: 06/29/2023 14:02   CT ABDOMEN PELVIS W CONTRAST Result Date: 06/29/2023 CLINICAL DATA:  Acute onset sharp epigastric pain and vomiting after eating last night. EXAM: CT ABDOMEN AND PELVIS WITH CONTRAST TECHNIQUE: Multidetector CT imaging of the abdomen and pelvis was performed using the standard protocol following bolus administration of intravenous contrast. RADIATION DOSE REDUCTION: This exam was performed according to the departmental dose-optimization program which includes automated exposure control, adjustment of the mA and/or kV according to patient size and/or use of iterative  reconstruction technique. CONTRAST:  OMNIPAQUE  IOHEXOL  300 MG/ML  SOLN COMPARISON:  CT abdomen pelvis dated April 12, 2023. FINDINGS: Lower chest: No acute abnormality. Unchanged severe circumferential wall thickening of the distal esophagus. Hepatobiliary: No focal liver abnormality is seen. No gallstones, gallbladder wall thickening, or biliary dilatation. Pancreas: Unremarkable. No pancreatic ductal dilatation or surrounding inflammatory changes. Spleen: Normal in size without focal abnormality. Adrenals/Urinary Tract: Adrenal glands are unremarkable. Kidneys are normal, without renal calculi, focal lesion, or hydronephrosis. Bladder is unremarkable. Stomach/Bowel: Prominently distended stomach. Multiple mildly dilated loops of small bowel with transition point in the anterior mid abdomen at the level of the umbilicus (series 8, image 54). Fecalization small bowel contents proximal to transition point. Decompressed colon with left-sided colonic diverticulosis. Normal appendix. Vascular/Lymphatic: Aortic atherosclerosis. No enlarged abdominal or pelvic lymph nodes. Reproductive: Uterus and bilateral adnexa are unremarkable. Other: Small amount of free fluid in the pelvis. No pneumoperitoneum. Musculoskeletal: No acute or significant osseous findings. IMPRESSION: 1. Small bowel obstruction with transition point in the anterior mid abdomen at the level of the umbilicus. 2. 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 Consider further evaluation with endoscopy if not previously performed. 3.  Aortic Atherosclerosis (ICD10-I70.0). Electronically Signed   By: Elsie ONEIDA Shoulder M.D.   On: 06/29/2023 10:03      Vernal Alstrom, MD  [facility] 07/01/2023  If 7PM-7AM, please contact night-coverage

## 2023-07-01 NOTE — Plan of Care (Signed)
  Problem: Education: Goal: Ability to describe self-care measures that may prevent or decrease complications (Diabetes Survival Skills Education) will improve Outcome: Progressing Goal: Individualized Educational Video(s) Outcome: Progressing   Problem: Coping: Goal: Ability to adjust to condition or change in health will improve Outcome: Progressing   Problem: Fluid Volume: Goal: Ability to maintain a balanced intake and output will improve Outcome: Progressing   Problem: Health Behavior/Discharge Planning: Goal: Ability to identify and utilize available resources and services will improve Outcome: Progressing Goal: Ability to manage health-related needs will improve Outcome: Progressing   Problem: Metabolic: Goal: Ability to maintain appropriate glucose levels will improve Outcome: Progressing   Problem: Nutritional: Goal: Maintenance of adequate nutrition will improve Outcome: Progressing Goal: Progress toward achieving an optimal weight will improve Outcome: Progressing   Problem: Skin Integrity: Goal: Risk for impaired skin integrity will decrease Outcome: Progressing   Problem: Tissue Perfusion: Goal: Adequacy of tissue perfusion will improve Outcome: Progressing   Problem: Education: Goal: Knowledge of General Education information will improve Description: Including pain rating scale, medication(s)/side effects and non-pharmacologic comfort measures Outcome: Progressing   Problem: Health Behavior/Discharge Planning: Goal: Ability to manage health-related needs will improve Outcome: Progressing   Problem: Clinical Measurements: Goal: Ability to maintain clinical measurements within normal limits will improve Outcome: Progressing Goal: Will remain free from infection Outcome: Progressing Goal: Diagnostic test results will improve Outcome: Progressing Goal: Respiratory complications will improve Outcome: Progressing Goal: Cardiovascular complication will  be avoided Outcome: Progressing   Problem: Activity: Goal: Risk for activity intolerance will decrease Outcome: Progressing   Problem: Nutrition: Goal: Adequate nutrition will be maintained Outcome: Progressing   Problem: Coping: Goal: Level of anxiety will decrease Outcome: Progressing   Problem: Elimination: Goal: Will not experience complications related to bowel motility Outcome: Progressing Goal: Will not experience complications related to urinary retention Outcome: Progressing   Problem: Pain Management: Goal: General experience of comfort will improve Outcome: Progressing   Problem: Safety: Goal: Ability to remain free from injury will improve Outcome: Progressing   Problem: Skin Integrity: Goal: Risk for impaired skin integrity will decrease Outcome: Progressing   Problem: Education: Goal: Knowledge of General Education information will improve Description: Including pain rating scale, medication(s)/side effects and non-pharmacologic comfort measures Outcome: Progressing   Problem: Health Behavior/Discharge Planning: Goal: Ability to manage health-related needs will improve Outcome: Progressing   Problem: Clinical Measurements: Goal: Ability to maintain clinical measurements within normal limits will improve Outcome: Progressing Goal: Will remain free from infection Outcome: Progressing Goal: Diagnostic test results will improve Outcome: Progressing Goal: Respiratory complications will improve Outcome: Progressing Goal: Cardiovascular complication will be avoided Outcome: Progressing   Problem: Activity: Goal: Risk for activity intolerance will decrease Outcome: Progressing   Problem: Nutrition: Goal: Adequate nutrition will be maintained Outcome: Progressing   Problem: Coping: Goal: Level of anxiety will decrease Outcome: Progressing   Problem: Elimination: Goal: Will not experience complications related to bowel motility Outcome:  Progressing Goal: Will not experience complications related to urinary retention Outcome: Progressing   Problem: Pain Management: Goal: General experience of comfort will improve Outcome: Progressing   Problem: Safety: Goal: Ability to remain free from injury will improve Outcome: Progressing   Problem: Skin Integrity: Goal: Risk for impaired skin integrity will decrease Outcome: Progressing

## 2023-07-01 NOTE — Progress Notes (Signed)
 Progress Note     Subjective: Had bowel movements overnight. XR shows contrast in colon. Feeling better this morning.  Objective: Vital signs in last 24 hours: Temp:  [98.3 F (36.8 C)-99.3 F (37.4 C)] 98.3 F (36.8 C) (01/11 0515) Pulse Rate:  [55-61] 55 (01/11 0515) Resp:  [14-18] 14 (01/11 0515) BP: (144-148)/(69-78) 148/78 (01/11 0515) SpO2:  [95 %-99 %] 95 % (01/11 0515) Last BM Date : 06/30/23  Intake/Output from previous day: 01/10 0701 - 01/11 0700 In: 2665.5 [P.O.:30; I.V.:2065.5; NG/GT:270; IV Piggyback:300] Out: 500 [Emesis/NG output:500] Intake/Output this shift: No intake/output data recorded.  PE: General: resting comfortably, NAD Neuro: alert and oriented, no focal deficits HEENT: NG in place draining nonbilious fluid Abdomen: soft, nondistended, nontender to palpation.  Extremities: warm and well-perfused    Lab Results:  Recent Labs    06/29/23 0641 06/30/23 0604  WBC 10.4 10.6*  HGB 13.9 12.4  HCT 41.7 39.9  PLT 314 247   BMET Recent Labs    06/29/23 0641 06/30/23 0604  NA 140 145  K 3.7 3.8  CL 107 111  CO2 20* 27  GLUCOSE 270* 136*  BUN 13 11  CREATININE 0.91 0.59  CALCIUM  9.8 9.1   PT/INR No results for input(s): LABPROT, INR in the last 72 hours. CMP     Component Value Date/Time   NA 145 06/30/2023 0604   NA 145 (H) 05/12/2020 1004   K 3.8 06/30/2023 0604   CL 111 06/30/2023 0604   CO2 27 06/30/2023 0604   GLUCOSE 136 (H) 06/30/2023 0604   BUN 11 06/30/2023 0604   BUN 13 05/12/2020 1004   CREATININE 0.59 06/30/2023 0604   CALCIUM  9.1 06/30/2023 0604   PROT 8.0 06/29/2023 0641   PROT 6.7 01/28/2020 1611   ALBUMIN 4.2 06/29/2023 0641   ALBUMIN 4.2 01/28/2020 1611   AST 26 06/29/2023 0641   ALT 16 06/29/2023 0641   ALKPHOS 77 06/29/2023 0641   BILITOT 0.5 06/29/2023 0641   BILITOT 0.2 01/28/2020 1611   GFRNONAA >60 06/30/2023 0604   GFRAA 96 05/12/2020 1004   Lipase     Component Value Date/Time    LIPASE 33 06/29/2023 0641       Studies/Results: DG Abd Portable 1V-Small Bowel Obstruction Protocol-initial, 8 hr delay Result Date: 06/30/2023 CLINICAL DATA:  Small-bowel obstruction 8 hour delay EXAM: PORTABLE ABDOMEN - 1 VIEW COMPARISON:  06/30/2023, 06/29/2023, CT 06/29/2023 FINDINGS: Enteric tube tip overlies the stomach. Enteral contrast within the stomach. Dilute contrast visible within central dilated small bowel loops measuring up to 5.1 cm. Enteral contrast is present within the colon and rectum. IMPRESSION: Enteral contrast within the colon and rectum. Persistent dilated central small bowel containing dilute contrast Electronically Signed   By: Luke Bun M.D.   On: 06/30/2023 22:48   DG Abd Portable 1V Result Date: 06/30/2023 CLINICAL DATA:  Small bowel obstruction. EXAM: PORTABLE ABDOMEN - 1 VIEW COMPARISON:  CT abdomen pelvis dated 06/29/2023. FINDINGS: Enteric tube with side-port in the region of the GE junction and tip in the proximal stomach. Recommend further advancing by additional 7 cm. Dilated small bowel loop in the upper abdomen measures approximately 4.5 cm in caliber. IMPRESSION: 1. Enteric tube with tip in the proximal stomach. Recommend further advancing by additional 7 cm. 2. Small bowel dilatation. Electronically Signed   By: Vanetta Chou M.D.   On: 06/30/2023 09:38   DG Abd Portable 1 View Result Date: 06/29/2023 CLINICAL DATA:  Nasogastric tube placement.  EXAM: PORTABLE ABDOMEN - 1 VIEW COMPARISON:  None Available. FINDINGS: The bowel gas pattern is non-obstructive. No evidence of pneumoperitoneum. No acute osseous abnormalities. The soft tissues are within normal limits. Surgical changes, devices, tubes and lines: Interval placement of enteric tube. The tip overlies the left upper quadrant, overlying the proximal stomach region. However, the side hole is just above the level of left hemidiaphragm, in the lower thoracic esophagus. Consider further advancement by  approximately 4 inches to confidently put the side-hole into the stomach. IMPRESSION: *Interval placement of enteric tube. The tip overlies the left upper quadrant, overlying the proximal stomach region. However, the side hole is just above the level of left hemidiaphragm, in the lower thoracic esophagus. Consider further advancement by approximately 4 inches to confidently put the side-hole into the stomach. Electronically Signed   By: Ree Molt M.D.   On: 06/29/2023 14:02   CT ABDOMEN PELVIS W CONTRAST Result Date: 06/29/2023 CLINICAL DATA:  Acute onset sharp epigastric pain and vomiting after eating last night. EXAM: CT ABDOMEN AND PELVIS WITH CONTRAST TECHNIQUE: Multidetector CT imaging of the abdomen and pelvis was performed using the standard protocol following bolus administration of intravenous contrast. RADIATION DOSE REDUCTION: This exam was performed according to the departmental dose-optimization program which includes automated exposure control, adjustment of the mA and/or kV according to patient size and/or use of iterative reconstruction technique. CONTRAST:  OMNIPAQUE  IOHEXOL  300 MG/ML  SOLN COMPARISON:  CT abdomen pelvis dated April 12, 2023. FINDINGS: Lower chest: No acute abnormality. Unchanged severe circumferential wall thickening of the distal esophagus. Hepatobiliary: No focal liver abnormality is seen. No gallstones, gallbladder wall thickening, or biliary dilatation. Pancreas: Unremarkable. No pancreatic ductal dilatation or surrounding inflammatory changes. Spleen: Normal in size without focal abnormality. Adrenals/Urinary Tract: Adrenal glands are unremarkable. Kidneys are normal, without renal calculi, focal lesion, or hydronephrosis. Bladder is unremarkable. Stomach/Bowel: Prominently distended stomach. Multiple mildly dilated loops of small bowel with transition point in the anterior mid abdomen at the level of the umbilicus (series 8, image 54). Fecalization small bowel  contents proximal to transition point. Decompressed colon with left-sided colonic diverticulosis. Normal appendix. Vascular/Lymphatic: Aortic atherosclerosis. No enlarged abdominal or pelvic lymph nodes. Reproductive: Uterus and bilateral adnexa are unremarkable. Other: Small amount of free fluid in the pelvis. No pneumoperitoneum. Musculoskeletal: No acute or significant osseous findings. IMPRESSION: 1. Small bowel obstruction with transition point in the anterior mid abdomen at the level of the umbilicus. 2. 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 Consider further evaluation with endoscopy if not previously performed. 3.  Aortic Atherosclerosis (ICD10-I70.0). Electronically Signed   By: Elsie ONEIDA Shoulder M.D.   On: 06/29/2023 10:03    Anti-infectives: Anti-infectives (From admission, onward)    None        Assessment/Plan SBO Likely adhesive in setting of prior abd surgery - Resolving after gastrografin  challenge, now having bowel movements - Remove NG tube - Advance to clear liquid diet. If patient tolerates, can advance to soft diet this afternoon. - Labs pending this morning  I reviewed hospitalist notes, last 24 h vitals and pain scores, last 48 h intake and output, last 24 h labs and trends, and last 24 h imaging results.    LOS: 2 days   Leonor LITTIE Dawn, MD [facility] 07/01/2023, 8:00 AM Please see Amion for pager number during day hours 7:00am-4:30pm

## 2023-07-02 DIAGNOSIS — K56609 Unspecified intestinal obstruction, unspecified as to partial versus complete obstruction: Secondary | ICD-10-CM | POA: Diagnosis not present

## 2023-07-02 LAB — CBC
HCT: 35.7 % — ABNORMAL LOW (ref 36.0–46.0)
Hemoglobin: 11 g/dL — ABNORMAL LOW (ref 12.0–15.0)
MCH: 27.5 pg (ref 26.0–34.0)
MCHC: 30.8 g/dL (ref 30.0–36.0)
MCV: 89.3 fL (ref 80.0–100.0)
Platelets: 200 K/uL (ref 150–400)
RBC: 4 MIL/uL (ref 3.87–5.11)
RDW: 15.1 % (ref 11.5–15.5)
WBC: 6.5 K/uL (ref 4.0–10.5)
nRBC: 0 % (ref 0.0–0.2)

## 2023-07-02 LAB — GLUCOSE, CAPILLARY
Glucose-Capillary: 113 mg/dL — ABNORMAL HIGH (ref 70–99)
Glucose-Capillary: 87 mg/dL (ref 70–99)
Glucose-Capillary: 122 mg/dL — ABNORMAL HIGH (ref 70–99)
Glucose-Capillary: 102 mg/dL — ABNORMAL HIGH (ref 70–99)
Glucose-Capillary: 102 mg/dL — ABNORMAL HIGH (ref 70–99)

## 2023-07-02 LAB — BASIC METABOLIC PANEL WITH GFR
Anion gap: 6 (ref 5–15)
BUN: 7 mg/dL — ABNORMAL LOW (ref 8–23)
CO2: 29 mmol/L (ref 22–32)
Calcium: 8.6 mg/dL — ABNORMAL LOW (ref 8.9–10.3)
Chloride: 107 mmol/L (ref 98–111)
Creatinine, Ser: 0.68 mg/dL (ref 0.44–1.00)
GFR, Estimated: >60 mL/min
Glucose, Bld: 99 mg/dL (ref 70–99)
Potassium: 3.1 mmol/L — ABNORMAL LOW (ref 3.5–5.1)
Sodium: 142 mmol/L (ref 135–145)

## 2023-07-02 LAB — MAGNESIUM: Magnesium: 1.9 mg/dL (ref 1.7–2.4)

## 2023-07-02 MED ORDER — POTASSIUM CHLORIDE CRYS ER 20 MEQ PO TBCR
40.0000 meq | EXTENDED_RELEASE_TABLET | ORAL | Status: AC
  Administered 2023-07-02 (×2): 40 meq via ORAL
  Filled 2023-07-02 (×2): qty 2

## 2023-07-02 NOTE — Progress Notes (Signed)
 Progress Note     Subjective: Continues to have bowel function, tolerating clear liquids. Reports very mild abdominal pain. KUB shows a dilated bowel loop, contrast throughout the colon. Afebrile, WBC normal today.  Objective: Vital signs in last 24 hours: Temp:  [97.6 F (36.4 C)-97.9 F (36.6 C)] 97.6 F (36.4 C) (01/12 0436) Pulse Rate:  [62-63] 63 (01/12 0436) Resp:  [17] 17 (01/12 0436) BP: (125-148)/(69-72) 135/71 (01/12 0436) SpO2:  [92 %-93 %] 93 % (01/12 0436) Last BM Date : 07/02/23  Intake/Output from previous day: 01/11 0701 - 01/12 0700 In: 831 [P.O.:831] Out: 1100 [Emesis/NG output:1100] Intake/Output this shift: No intake/output data recorded.  PE: General: resting comfortably, NAD Neuro: alert and oriented, no focal deficits Abdomen: soft, nondistended, nontender to palpation.  Extremities: warm and well-perfused    Lab Results:  Recent Labs    07/01/23 0933 07/02/23 0655  WBC 8.5 6.5  HGB 11.2* 11.0*  HCT 35.4* 35.7*  PLT 206 200   BMET Recent Labs    06/30/23 0604 07/01/23 0933  NA 145 145  K 3.8 3.5  CL 111 111  CO2 27 26  GLUCOSE 136* 110*  BUN 11 10  CREATININE 0.59 0.62  CALCIUM  9.1 8.6*   PT/INR No results for input(s): LABPROT, INR in the last 72 hours. CMP     Component Value Date/Time   NA 145 07/01/2023 0933   NA 145 (H) 05/12/2020 1004   K 3.5 07/01/2023 0933   CL 111 07/01/2023 0933   CO2 26 07/01/2023 0933   GLUCOSE 110 (H) 07/01/2023 0933   BUN 10 07/01/2023 0933   BUN 13 05/12/2020 1004   CREATININE 0.62 07/01/2023 0933   CALCIUM  8.6 (L) 07/01/2023 0933   PROT 8.0 06/29/2023 0641   PROT 6.7 01/28/2020 1611   ALBUMIN 4.2 06/29/2023 0641   ALBUMIN 4.2 01/28/2020 1611   AST 26 06/29/2023 0641   ALT 16 06/29/2023 0641   ALKPHOS 77 06/29/2023 0641   BILITOT 0.5 06/29/2023 0641   BILITOT 0.2 01/28/2020 1611   GFRNONAA >60 07/01/2023 0933   GFRAA 96 05/12/2020 1004   Lipase     Component Value  Date/Time   LIPASE 33 06/29/2023 0641       Studies/Results: DG Abd Portable 1V-Small Bowel Obstruction Protocol-24 hr delay Result Date: 07/01/2023 CLINICAL DATA:  Small bowel obstruction EXAM: PORTABLE ABDOMEN - 1 VIEW COMPARISON:  06/30/2023 10:35 p.m. FINDINGS: According to the technologist note this images obtained 24 hours after administration of contrast medium. The contrast medium is visible in the colon and rectum. Previous dilute small-bowel contrast is less conspicuous. However, there is still some small bowel dilatation, with a central loop of small bowel measuring 4.7 cm in diameter. IMPRESSION: 1. Contrast medium is visible in the colon and rectum. 2. Persistent small bowel dilatation, with a central loop of small bowel measuring 4.7 cm in diameter. Electronically Signed   By: Ryan Salvage M.D.   On: 07/01/2023 16:09   DG Abd Portable 1V-Small Bowel Obstruction Protocol-initial, 8 hr delay Result Date: 06/30/2023 CLINICAL DATA:  Small-bowel obstruction 8 hour delay EXAM: PORTABLE ABDOMEN - 1 VIEW COMPARISON:  06/30/2023, 06/29/2023, CT 06/29/2023 FINDINGS: Enteric tube tip overlies the stomach. Enteral contrast within the stomach. Dilute contrast visible within central dilated small bowel loops measuring up to 5.1 cm. Enteral contrast is present within the colon and rectum. IMPRESSION: Enteral contrast within the colon and rectum. Persistent dilated central small bowel containing dilute contrast Electronically Signed  By: Luke Bun M.D.   On: 06/30/2023 22:48    Anti-infectives: Anti-infectives (From admission, onward)    None        Assessment/Plan SBO Likely adhesive in setting of prior abd surgery - Having bowel function - Advance to soft diet - Single persistent dilated bowel loop noted on XR. Will follow patient clinically as she is having bowel function. If pain worsens, may need to consider a diagnostic lap.  I reviewed hospitalist notes, last 24 h  vitals and pain scores, last 48 h intake and output, last 24 h labs and trends, and last 24 h imaging results.    LOS: 3 days   Leonor LITTIE Dawn, MD [facility] 07/02/2023, 7:44 AM Please see Amion for pager number during day hours 7:00am-4:30pm

## 2023-07-02 NOTE — Plan of Care (Signed)
  Problem: Education: Goal: Ability to describe self-care measures that may prevent or decrease complications (Diabetes Survival Skills Education) will improve Outcome: Progressing Goal: Individualized Educational Video(s) Outcome: Progressing   Problem: Coping: Goal: Ability to adjust to condition or change in health will improve Outcome: Progressing   Problem: Health Behavior/Discharge Planning: Goal: Ability to identify and utilize available resources and services will improve Outcome: Progressing Goal: Ability to manage health-related needs will improve Outcome: Progressing   Problem: Metabolic: Goal: Ability to maintain appropriate glucose levels will improve Outcome: Progressing   Problem: Nutritional: Goal: Progress toward achieving an optimal weight will improve Outcome: Progressing   Problem: Education: Goal: Knowledge of General Education information will improve Description: Including pain rating scale, medication(s)/side effects and non-pharmacologic comfort measures Outcome: Progressing   Problem: Health Behavior/Discharge Planning: Goal: Ability to manage health-related needs will improve Outcome: Progressing   Problem: Clinical Measurements: Goal: Ability to maintain clinical measurements within normal limits will improve Outcome: Progressing Goal: Will remain free from infection Outcome: Progressing Goal: Diagnostic test results will improve Outcome: Progressing   Problem: Activity: Goal: Risk for activity intolerance will decrease Outcome: Progressing   Problem: Nutrition: Goal: Adequate nutrition will be maintained Outcome: Progressing   Problem: Education: Goal: Knowledge of General Education information will improve Description: Including pain rating scale, medication(s)/side effects and non-pharmacologic comfort measures Outcome: Progressing   Problem: Health Behavior/Discharge Planning: Goal: Ability to manage health-related needs will  improve Outcome: Progressing   Problem: Clinical Measurements: Goal: Ability to maintain clinical measurements within normal limits will improve Outcome: Progressing Goal: Diagnostic test results will improve Outcome: Progressing   Problem: Activity: Goal: Risk for activity intolerance will decrease Outcome: Progressing

## 2023-07-02 NOTE — Progress Notes (Addendum)
 PROGRESS NOTE  Christine Hopkins FMW:[id number] DOB: Jan 10, 1956 DOA: 06/29/2023 PCP: Emilio Joesph DEL, PA-C   LOS: 3 days   Brief narrative:  Christine Hopkins is a 68 y.o. 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  In the ED patient was noted to be hypertensive with mild tachycardia.  Initial labs showed hyperglycemia lipase of 33 urinalysis negative for WBC.  Hemoglobin A1c was 6.6.  HIV was nonreactive.  CT scan of the abdomen and pelvis done in the ED showed small bowel obstruction in the anterior mid abdomen at the level of umbilicus with thickening of the distal esophagus. General surgery was consulted and patient was then considered for admission to hospital for bowel obstruction  Assessment and plan.  Small bowel obstruction- with abdominal distention, pain, nausea, and CT findings of bowel obstruction with transition point in the anterior mid abdomen to the level of the umbilicus on presentation.  Patient with prior oophorectomy several years ago.  Initially received NG tube IV fluids n.p.o. analgesics and antiemetics with improvement.  Small bowel follow-through with contrast down in the bowel.  General surgery on board and patient has been advanced on soft diet today.  If continues to have any abdominal symptoms might need diagnostic laparoscopy.  Will follow GI recommendation.   Hypertension-on lisinopril  at home.  Continue lisinopril .  Blood pressure slightly elevated.  Hypokalemia.  Will give p.o. potassium 40 mEq x 2 today.  Potassium was 3.1 today.  Check BMP in AM.   Depression-on Zoloft  home.  Continue   Diabetes melitus-hemoglobin A1c of 6.6.  On sliding scale insulin .  Continue to monitor.  On D5 normal saline with 20 mEq of potassium.  Will closely monitor and discontinue once her p.o. is  adequate.  GERD Continue PPI.  Hyperlipidemia On Crestor  at home.  Will hold for now.  Grade 2 obesity. Body mass index is 37.25 kg/m.   Might benefit from weight loss as outpatient    DVT prophylaxis: enoxaparin  (LOVENOX ) injection 40 mg Start: 06/29/23 1200 SCDs Start: 06/29/23 1129   Disposition: Home likely in 1 to 2 days when okay with general surgery.  Status is: Inpatient  Remains inpatient appropriate because: Bowel obstruction, pending clinical improvement    Code Status:     Code Status: Full Code  Family Communication: None at bedside  Consultants: General surgery  Procedures: NG tube insertion and removal  Anti-infectives:  None  Anti-infectives (From admission, onward)    None       Subjective: Today, patient was seen and examined at bedside.  Feels okay.  Has been able to tolerate oral diet.  Has had bowel movements.  Seen by general surgery today. Objective: Vitals:   07/02/23 0436 07/02/23 0832  BP: 135/71 (!) 150/73  Pulse: 63   Resp: 17   Temp: 97.6 F (36.4 C)   SpO2: 93%     Intake/Output Summary (Last 24 hours) at 07/02/2023 0847 Last data filed at 07/02/2023 0145 Gross per 24 hour  Intake 831 ml  Output 1100 ml  Net -269 ml   Filed Weights   06/29/23 0624  Weight: 98.4 kg   Body mass index is 37.25 kg/m.   Physical Exam:  GENERAL: Patient is alert awake and oriented. Not in obvious distress.,  Obese built, HENT: No scleral pallor or icterus. Pupils equally reactive to light. Oral mucosa is moist NECK:  is supple, no gross swelling noted. CHEST: Clear to auscultation.  Diminished breath sounds bilaterally. CVS: S1 and S2 heard, no murmur. Regular rate and rhythm.  ABDOMEN: Soft, bowel sounds present. EXTREMITIES: No edema. CNS: Cranial nerves are intact. No focal motor deficits. SKIN: warm and dry without rashes.  Data Review: I have personally reviewed the following laboratory data and studies,  CBC: Recent  Labs  Lab 2023-07-09 0641 06/30/23 0604 07/01/23 0933 07/02/23 0655  WBC 10.4 10.6* 8.5 6.5  HGB 13.9 12.4 11.2* 11.0*  HCT 41.7 39.9 35.4* 35.7*  MCV 84.6 89.9 89.6 89.3  PLT 314 247 206 200   Basic Metabolic Panel: Recent Labs  Lab 07/09/2023 0641 06/30/23 0604 07/01/23 0933 07/02/23 0655  NA 140 145 145 142  K 3.7 3.8 3.5 3.1*  CL 107 111 111 107  CO2 20* 27 26 29   GLUCOSE 270* 136* 110* 99  BUN 13 11 10  7*  CREATININE 0.91 0.59 0.62 0.68  CALCIUM  9.8 9.1 8.6* 8.6*  MG  --   --  2.0 1.9  PHOS  --   --  3.1  --    Liver Function Tests: Recent Labs  Lab 07/09/2023 0641  AST 26  ALT 16  ALKPHOS 77  BILITOT 0.5  PROT 8.0  ALBUMIN 4.2   Recent Labs  Lab 07/09/23 0641  LIPASE 33   No results for input(s): AMMONIA in the last 168 hours. Cardiac Enzymes: No results for input(s): CKTOTAL, CKMB, CKMBINDEX, TROPONINI in the last 168 hours. BNP (last 3 results) No results for input(s): BNP in the last 8760 hours.  ProBNP (last 3 results) No results for input(s): PROBNP in the last 8760 hours.  CBG: Recent Labs  Lab 07/01/23 1625 07/01/23 2147 07/01/23 2340 07/02/23 0438 07/02/23 0721  GLUCAP 87 140* 102* 113* 87   No results found for this or any previous visit (from the past 240 hours).   Studies: DG Abd Portable 1V-Small Bowel Obstruction Protocol-24 hr delay Result Date: 07/01/2023 CLINICAL DATA:  Small bowel obstruction EXAM: PORTABLE ABDOMEN - 1 VIEW COMPARISON:  06/30/2023 10:35 p.m. FINDINGS: According to the technologist note this images obtained 24 hours after administration of contrast medium. The contrast medium is visible in the colon and rectum. Previous dilute small-bowel contrast is less conspicuous. However, there is still some small bowel dilatation, with a central loop of small bowel measuring 4.7 cm in diameter. IMPRESSION: 1. Contrast medium is visible in the colon and rectum. 2. Persistent small bowel dilatation, with a central  loop of small bowel measuring 4.7 cm in diameter. Electronically Signed   By: Ryan Salvage M.D.   On: 07/01/2023 16:09   DG Abd Portable 1V-Small Bowel Obstruction Protocol-initial, 8 hr delay Result Date: 06/30/2023 CLINICAL DATA:  Small-bowel obstruction 8 hour delay EXAM: PORTABLE ABDOMEN - 1 VIEW COMPARISON:  06/30/2023, 07/09/23, CT 2023-07-09 FINDINGS: Enteric tube tip overlies the stomach. Enteral contrast within the stomach. Dilute contrast visible within central dilated small bowel loops measuring up to 5.1 cm. Enteral contrast is present within the colon and rectum. IMPRESSION: Enteral contrast within the colon and rectum. Persistent dilated central small bowel containing dilute contrast Electronically Signed   By: Luke Bun M.D.   On: 06/30/2023 22:48      Vernal Alstrom, MD  [facility] 07/02/2023  If 7PM-7AM, please contact night-coverage

## 2023-07-02 NOTE — Plan of Care (Signed)
  Problem: Coping: Goal: Ability to adjust to condition or change in health will improve Outcome: Progressing   Problem: Metabolic: Goal: Ability to maintain appropriate glucose levels will improve Outcome: Progressing   Problem: Clinical Measurements: Goal: Cardiovascular complication will be avoided Outcome: Progressing   Problem: Activity: Goal: Risk for activity intolerance will decrease Outcome: Progressing

## 2023-07-03 DIAGNOSIS — K56609 Unspecified intestinal obstruction, unspecified as to partial versus complete obstruction: Secondary | ICD-10-CM | POA: Diagnosis not present

## 2023-07-03 LAB — BASIC METABOLIC PANEL
Anion gap: 6 (ref 5–15)
BUN: 7 mg/dL — ABNORMAL LOW (ref 8–23)
CO2: 29 mmol/L (ref 22–32)
Calcium: 8.6 mg/dL — ABNORMAL LOW (ref 8.9–10.3)
Chloride: 106 mmol/L (ref 98–111)
Creatinine, Ser: 0.55 mg/dL (ref 0.44–1.00)
GFR, Estimated: >60 mL/min (ref >60)
Glucose, Bld: 123 mg/dL — ABNORMAL HIGH (ref 70–99)
Potassium: 3 mmol/L — ABNORMAL LOW (ref 3.5–5.1)
Sodium: 141 mmol/L (ref 135–145)

## 2023-07-03 LAB — CBC
HCT: 34.2 % — ABNORMAL LOW (ref 36.0–46.0)
Hemoglobin: 11.4 g/dL — ABNORMAL LOW (ref 12.0–15.0)
MCH: 28.6 pg (ref 26.0–34.0)
MCHC: 33.3 g/dL (ref 30.0–36.0)
MCV: 85.7 fL (ref 80.0–100.0)
Platelets: 212 10*3/uL (ref 150–400)
RBC: 3.99 MIL/uL (ref 3.87–5.11)
RDW: 15 % (ref 11.5–15.5)
WBC: 6.1 10*3/uL (ref 4.0–10.5)
nRBC: 0 % (ref 0.0–0.2)

## 2023-07-03 LAB — GLUCOSE, CAPILLARY
Glucose-Capillary: 112 mg/dL — ABNORMAL HIGH (ref 70–99)
Glucose-Capillary: 98 mg/dL (ref 70–99)
Glucose-Capillary: 104 mg/dL — ABNORMAL HIGH (ref 70–99)
Glucose-Capillary: 134 mg/dL — ABNORMAL HIGH (ref 70–99)

## 2023-07-03 LAB — MAGNESIUM: Magnesium: 2 mg/dL (ref 1.7–2.4)

## 2023-07-03 MED ORDER — POTASSIUM CHLORIDE CRYS ER 20 MEQ PO TBCR: 20.0000 meq | EXTENDED_RELEASE_TABLET | Freq: Every day | ORAL | 0 refills | Status: AC

## 2023-07-03 MED ORDER — PANTOPRAZOLE SODIUM 40 MG PO TBEC: DELAYED_RELEASE_TABLET | ORAL | 0 refills | Status: AC

## 2023-07-03 MED ORDER — POTASSIUM CHLORIDE CRYS ER 20 MEQ PO TBCR
40.0000 meq | EXTENDED_RELEASE_TABLET | Freq: Once | ORAL | Status: AC
  Administered 2023-07-03: 40 meq via ORAL
  Filled 2023-07-03: qty 2

## 2023-07-03 MED ORDER — POTASSIUM CHLORIDE 10 MEQ/100ML IV SOLN
10.0000 meq | INTRAVENOUS | Status: AC
  Administered 2023-07-03 (×2): 10 meq via INTRAVENOUS
  Filled 2023-07-03 (×2): qty 100

## 2023-07-03 NOTE — Progress Notes (Addendum)
   Subjective/Chief Complaint: Having bowel function, feels better, ate regular food this am, biggest issue is potassium infusion and esophageal spasm   Objective: Vital signs in last 24 hours: Temp:  [98.3 F (36.8 C)-99.8 F (37.7 C)] 98.6 F (37 C) (01/13 0407) Pulse Rate:  [57-106] 57 (01/13 0407) Resp:  [16-18] 18 (01/13 0407) BP: (132-143)/(63-77) 132/77 (01/13 0407) SpO2:  [91 %-96 %] 96 % (01/13 0407) Last BM Date : 07/03/23  Intake/Output from previous day: 01/12 0701 - 01/13 0700 In: 1908 [P.O.:1908] Out: 300 [Urine:300] Intake/Output this shift: No intake/output data recorded.  Ab soft nontender nondistended  Lab Results:  Recent Labs    07/02/23 0655 07/03/23 0534  WBC 6.5 6.1  HGB 11.0* 11.4*  HCT 35.7* 34.2*  PLT 200 212   BMET Recent Labs    07/02/23 0655 07/03/23 0534  NA 142 141  K 3.1* 3.0*  CL 107 106  CO2 29 29  GLUCOSE 99 123*  BUN 7* 7*  CREATININE 0.68 0.55  CALCIUM  8.6* 8.6*   PT/INR No results for input(s): LABPROT, INR in the last 72 hours. ABG No results for input(s): PHART, HCO3 in the last 72 hours.  Invalid input(s): PCO2, PO2  Studies/Results: DG Abd Portable 1V-Small Bowel Obstruction Protocol-24 hr delay Result Date: 07/01/2023 CLINICAL DATA:  Small bowel obstruction EXAM: PORTABLE ABDOMEN - 1 VIEW COMPARISON:  06/30/2023 10:35 p.m. FINDINGS: According to the technologist note this images obtained 24 hours after administration of contrast medium. The contrast medium is visible in the colon and rectum. Previous dilute small-bowel contrast is less conspicuous. However, there is still some small bowel dilatation, with a central loop of small bowel measuring 4.7 cm in diameter. IMPRESSION: 1. Contrast medium is visible in the colon and rectum. 2. Persistent small bowel dilatation, with a central loop of small bowel measuring 4.7 cm in diameter. Electronically Signed   By: Ryan Salvage M.D.   On: 07/01/2023  16:09    Anti-infectives: Anti-infectives (From admission, onward)    None       Assessment/Plan: SBO Likely adhesive in setting of prior abd surgery - Having bowel function - this appears to have completely resolved clinically now, I am fine with dc today -will sign off  I reviewed hospitalist notes, last 24 h vitals and pain scores, last 48 h intake and output, last 24 h labs and trends, and last 24 h imaging results.      Donnice Bury 07/03/2023

## 2023-07-03 NOTE — Discharge Summary (Signed)
 Physician Discharge Summary  Christine Hopkins FMW:[id number] DOB: 1956-03-02 DOA: 06/29/2023  PCP: Christine Joesph DEL, PA-C  Admit date: 06/29/2023 Discharge date: 07/03/2023  Admitted From: Home  Discharge disposition: Home   Recommendations for Outpatient Follow-Up:   Follow up with your primary care provider in one week.  Check CBC, BMP, magnesium  in the next visit  Discharge Diagnosis:   Principal Problem:   SBO (small bowel obstruction) (HCC) Active Problems:   Essential hypertension   Dyslipidemia   History of back surgery   Type 2 diabetes mellitus with hyperglycemia, without long-term current use of insulin  [facility])   Discharge Condition: Improved.  Diet recommendation: Soft diet.  Advance as tolerated.  Wound care: None.  Code status: Full.   History of Present Illness:   Christine Hopkins is a 68 y.o. 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  In the ED patient was noted to be hypertensive with mild tachycardia.  Initial labs showed hyperglycemia lipase of 33 urinalysis negative for WBC.  Hemoglobin A1c was 6.6.  HIV was nonreactive.  CT scan of the abdomen and pelvis done in the ED showed small bowel obstruction in the anterior mid abdomen at the level of umbilicus with thickening of the distal esophagus. General surgery was consulted and patient was then considered for admission to hospital for bowel obstruction    Hospital Course:   Following conditions were addressed during hospitalization as listed below,  Small bowel obstruction- Patient presented with with abdominal distention, pain, nausea, and CT findings of bowel obstruction with transition point in the anterior mid abdomen to the level of the umbilicus on presentation.  Patient with prior oophorectomy several years ago.  Initially received  NG tube IV fluids n.p.o. analgesics and antiemetics with improvement.  Small bowel follow-through with contrast down in the bowel.  General surgery followed the patient during hospitalization and patient has improved with conservative treatment.  At this time patient has tolerated soft diet and has been seen by general surgery today.  Patient is stable for disposition home.   Hypertension-on lisinopril  at home.  Will resume on discharge.   Hypokalemia.  Potassium of 3.0 today.  Asymptomatic.  Will give 40 mEq of IV potassium and 40 p.o. today with p.o. potassium for next 10 days on discharge.  Patient will follow-up with PCP with outpatient BMP.   Depression-on Zoloft  home.  Continue   Diabetes melitus-hemoglobin A1c of 6.6.  Received sliding scale insulin  during hospitalization, diet control.  SABRAGERD Continue PPI twice daily for 2 weeks then once daily..   Hyperlipidemia On Crestor  at home.  Will resume on discharge.   Grade 2 obesity. Body mass index is 37.25 kg/m.   Might benefit from weight loss as outpatient  Disposition.  At this time, patient is stable for disposition home with outpatient PCP follow-up.  Medical Consultants:   General surgery  Procedures:    NG tube insertion and removal Subjective:   Today, patient was seen and examined at bedside.  Was able to tolerate orally.  Has had bowel movements.  No nausea vomiting fever chills or rigor  Discharge Exam:   Vitals:   07/02/23 2005 07/03/23 0407  BP: 135/63 132/77  Pulse: 61 (!) 57  Resp: 18 18  Temp: 99.8 F (37.7 C) 98.6 F (37 C)  SpO2: 94% 96%   Vitals:   07/02/23 0832 07/02/23  1227 07/02/23 2005 07/03/23 0407  BP: (!) 150/73 (!) 143/63 135/63 132/77  Pulse:  (!) 106 61 (!) 57  Resp:  16 18 18   Temp:  98.3 F (36.8 C) 99.8 F (37.7 C) 98.6 F (37 C)  TempSrc:  Oral Oral Oral  SpO2:  91% 94% 96%  Weight:      Height:       Body mass index is 37.25 kg/m.   General: Alert awake, not in  obvious distress, obese built HENT: pupils equally reacting to light,  No scleral pallor or icterus noted. Oral mucosa is moist.  Chest:  Clear breath sounds.  Diminished breath sounds bilaterally. No crackles or wheezes.  CVS: S1 &S2 heard. No murmur.  Regular rate and rhythm. Abdomen: Soft, nontender, nondistended.  Bowel sounds are heard.   Extremities: No cyanosis, clubbing or edema.  Peripheral pulses are palpable. Psych: Alert, awake and oriented, normal mood CNS:  No cranial nerve deficits.  Power equal in all extremities.   Skin: Warm and dry.  No rashes noted.  The results of significant diagnostics from this hospitalization (including imaging, microbiology, ancillary and laboratory) are listed below for reference.     Diagnostic Studies:   DG Abd Portable 1V-Small Bowel Obstruction Protocol-initial, 8 hr delay Result Date: 06/30/2023 CLINICAL DATA:  Small-bowel obstruction 8 hour delay EXAM: PORTABLE ABDOMEN - 1 VIEW COMPARISON:  06/30/2023, 06/29/2023, CT 06/29/2023 FINDINGS: Enteric tube tip overlies the stomach. Enteral contrast within the stomach. Dilute contrast visible within central dilated small bowel loops measuring up to 5.1 cm. Enteral contrast is present within the colon and rectum. IMPRESSION: Enteral contrast within the colon and rectum. Persistent dilated central small bowel containing dilute contrast Electronically Signed   By: Luke Bun M.D.   On: 06/30/2023 22:48   DG Abd Portable 1V Result Date: 06/30/2023 CLINICAL DATA:  Small bowel obstruction. EXAM: PORTABLE ABDOMEN - 1 VIEW COMPARISON:  CT abdomen pelvis dated 06/29/2023. FINDINGS: Enteric tube with side-port in the region of the GE junction and tip in the proximal stomach. Recommend further advancing by additional 7 cm. Dilated small bowel loop in the upper abdomen measures approximately 4.5 cm in caliber. IMPRESSION: 1. Enteric tube with tip in the proximal stomach. Recommend further advancing by additional 7  cm. 2. Small bowel dilatation. Electronically Signed   By: Vanetta Chou M.D.   On: 06/30/2023 09:38   DG Abd Portable 1 View Result Date: 06/29/2023 CLINICAL DATA:  Nasogastric tube placement. EXAM: PORTABLE ABDOMEN - 1 VIEW COMPARISON:  None Available. FINDINGS: The bowel gas pattern is non-obstructive. No evidence of pneumoperitoneum. No acute osseous abnormalities. The soft tissues are within normal limits. Surgical changes, devices, tubes and lines: Interval placement of enteric tube. The tip overlies the left upper quadrant, overlying the proximal stomach region. However, the side hole is just above the level of left hemidiaphragm, in the lower thoracic esophagus. Consider further advancement by approximately 4 inches to confidently put the side-hole into the stomach. IMPRESSION: *Interval placement of enteric tube. The tip overlies the left upper quadrant, overlying the proximal stomach region. However, the side hole is just above the level of left hemidiaphragm, in the lower thoracic esophagus. Consider further advancement by approximately 4 inches to confidently put the side-hole into the stomach. Electronically Signed   By: Ree Molt M.D.   On: 06/29/2023 14:02   CT ABDOMEN PELVIS W CONTRAST Result Date: 06/29/2023 CLINICAL DATA:  Acute onset sharp epigastric pain and vomiting after eating last  night. EXAM: CT ABDOMEN AND PELVIS WITH CONTRAST TECHNIQUE: Multidetector CT imaging of the abdomen and pelvis was performed using the standard protocol following bolus administration of intravenous contrast. RADIATION DOSE REDUCTION: This exam was performed according to the departmental dose-optimization program which includes automated exposure control, adjustment of the mA and/or kV according to patient size and/or use of iterative reconstruction technique. CONTRAST:  OMNIPAQUE  IOHEXOL  300 MG/ML  SOLN COMPARISON:  CT abdomen pelvis dated April 12, 2023. FINDINGS: Lower chest: No acute  abnormality. Unchanged severe circumferential wall thickening of the distal esophagus. Hepatobiliary: No focal liver abnormality is seen. No gallstones, gallbladder wall thickening, or biliary dilatation. Pancreas: Unremarkable. No pancreatic ductal dilatation or surrounding inflammatory changes. Spleen: Normal in size without focal abnormality. Adrenals/Urinary Tract: Adrenal glands are unremarkable. Kidneys are normal, without renal calculi, focal lesion, or hydronephrosis. Bladder is unremarkable. Stomach/Bowel: Prominently distended stomach. Multiple mildly dilated loops of small bowel with transition point in the anterior mid abdomen at the level of the umbilicus (series 8, image 54). Fecalization small bowel contents proximal to transition point. Decompressed colon with left-sided colonic diverticulosis. Normal appendix. Vascular/Lymphatic: Aortic atherosclerosis. No enlarged abdominal or pelvic lymph nodes. Reproductive: Uterus and bilateral adnexa are unremarkable. Other: Small amount of free fluid in the pelvis. No pneumoperitoneum. Musculoskeletal: No acute or significant osseous findings. IMPRESSION: 1. Small bowel obstruction with transition point in the anterior mid abdomen at the level of the umbilicus. 2. 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 Consider further evaluation with endoscopy if not previously performed. 3.  Aortic Atherosclerosis (ICD10-I70.0). Electronically Signed   By: Elsie ONEIDA Shoulder M.D.   On: 06/29/2023 10:03     Labs:   Basic Metabolic Panel: Recent Labs  Lab 06/29/23 0641 06/30/23 0604 07/01/23 0933 07/02/23 0655 07/03/23 0534  NA 140 145 145 142 141  K 3.7 3.8 3.5 3.1* 3.0*  CL 107 111 111 107 106  CO2 20* 27 26 29 29   GLUCOSE 270* 136* 110* 99 123*  BUN 13 11 10  7* 7*  CREATININE 0.91 0.59 0.62 0.68 0.55  CALCIUM  9.8 9.1 8.6* 8.6* 8.6*  MG  --   --  2.0 1.9 2.0  PHOS   --   --  3.1  --   --    GFR Estimated Creatinine Clearance: 77.8 mL/min (by C-G formula based on SCr of 0.55 mg/dL). Liver Function Tests: Recent Labs  Lab 06/29/23 0641  AST 26  ALT 16  ALKPHOS 77  BILITOT 0.5  PROT 8.0  ALBUMIN 4.2   Recent Labs  Lab 06/29/23 0641  LIPASE 33   No results for input(s): AMMONIA in the last 168 hours. Coagulation profile No results for input(s): INR, PROTIME in the last 168 hours.  CBC: Recent Labs  Lab 06/29/23 0641 06/30/23 0604 07/01/23 0933 07/02/23 0655 07/03/23 0534  WBC 10.4 10.6* 8.5 6.5 6.1  HGB 13.9 12.4 11.2* 11.0* 11.4*  HCT 41.7 39.9 35.4* 35.7* 34.2*  MCV 84.6 89.9 89.6 89.3 85.7  PLT 314 247 206 200 212   Cardiac Enzymes: No results for input(s): CKTOTAL, CKMB, CKMBINDEX, TROPONINI in the last 168 hours. BNP: Invalid input(s): POCBNP CBG: Recent Labs  Lab 07/02/23 2006 07/03/23 0128 07/03/23 0410 07/03/23 0728 07/03/23 1152  GLUCAP 102* 112* 98 104* 134*   D-Dimer No results for input(s): DDIMER in the last 72 hours. Hgb A1c No results for input(s): HGBA1C in the last 72 hours. Lipid  Profile No results for input(s): CHOL, HDL, LDLCALC, TRIG, CHOLHDL, LDLDIRECT in the last 72 hours. Thyroid  function studies No results for input(s): TSH, T4TOTAL, T3FREE, THYROIDAB in the last 72 hours.  Invalid input(s): FREET3 Anemia work up No results for input(s): VITAMINB12, FOLATE, FERRITIN, TIBC, IRON, RETICCTPCT in the last 72 hours. Microbiology No results found for this or any previous visit (from the past 240 hours).   Discharge Instructions:   Discharge Instructions     Call MD for:  persistant nausea and vomiting   Complete by: As directed    Call MD for:  severe uncontrolled pain   Complete by: As directed    Diet - low sodium heart healthy   Complete by: As directed    Discharge instructions   Complete by: As directed    Follow-up with your  primary care provider in 1 week.  Check blood work at that time.  Take potassium supplements as given.  Try to avoid Motrin  if you can.  Seek medical attention for worsening symptoms.   Increase activity slowly   Complete by: As directed       Allergies as of 07/03/2023   No Known Allergies      Medication List     STOP taking these medications    ibuprofen  800 MG tablet Commonly known as: ADVIL        TAKE these medications    allopurinol  300 MG tablet Commonly known as: ZYLOPRIM  Take 1 tablet (300 mg total) by mouth daily.   aspirin EC 81 MG tablet Take 81 mg by mouth See admin instructions. Take 81 mg by mouth in the morning when not taking the 325 mg strength- Swallow whole   aspirin EC 325 MG tablet Take 325 mg by mouth See admin instructions. Take 325 mg by mouth in the morning when not taking the 81 mg strength- Swallow whole.   gabapentin  100 MG capsule Commonly known as: NEURONTIN  Take 100 mg by mouth 3 (three) times daily as needed (back pain).   lisinopril  20 MG tablet Commonly known as: ZESTRIL  TAKE 1 TABLET BY MOUTH  DAILY What changed: when to take this   pantoprazole  40 MG tablet Commonly known as: Protonix  Take 1 tablet (40 mg total) by mouth 2 (two) times daily before a meal for 14 days, THEN 1 tablet (40 mg total) daily for 14 days. Start taking on: July 03, 2023   potassium chloride  SA 20 MEQ tablet Commonly known as: KLOR-CON  M Take 1 tablet (20 mEq total) by mouth daily for 10 days.   rosuvastatin  20 MG tablet Commonly known as: CRESTOR  Take 1 tablet (20 mg total) by mouth daily. What changed: when to take this   sertraline  100 MG tablet Commonly known as: ZOLOFT  TAKE 1 TABLET BY MOUTH  DAILY What changed: when to take this          Time coordinating discharge: 39 minutes  Signed:  Mackey Varricchio  [facility] 07/03/2023, 11:58 AM

## 2023-07-20 ENCOUNTER — Ambulatory Visit: Payer: Medicare Other | Admitting: Internal Medicine

## 2023-07-29 ENCOUNTER — Ambulatory Visit
Admission: RE | Admit: 2023-07-29 | Discharge: 2023-07-29 | Disposition: A | Discharge status: Home / Self Care | Payer: Medicare Other | Source: Ambulatory Visit | Attending: Neurosurgery | Admitting: Neurosurgery

## 2023-07-29 DIAGNOSIS — M5442 Lumbago with sciatica, left side: Secondary | ICD-10-CM

## 2023-07-29 MED ORDER — GADOPICLENOL 0.5 MMOL/ML IV SOLN
8.0000 mL | Freq: Once | INTRAVENOUS | Status: AC | PRN
  Administered 2023-07-29: 8 mL via INTRAVENOUS

## 2023-10-24 ENCOUNTER — Telehealth: Payer: Self-pay | Admitting: Pharmacy Technician

## 2023-10-24 NOTE — Telephone Encounter (Signed)
 [employer] email with refill form for request of chge in address for ozempic  and scanned in media

## 2023-12-25 LAB — COLOGUARD: COLOGUARD: NEGATIVE

## 2024-03-05 ENCOUNTER — Encounter

## 2024-03-06 ENCOUNTER — Telehealth: Admitting: Physician Assistant

## 2024-03-06 DIAGNOSIS — R0781 Pleurodynia: Secondary | ICD-10-CM

## 2024-03-06 NOTE — Progress Notes (Signed)
  Because of sharp pain in chest/rib area with this and need for exam and possible imaging, I feel your condition warrants further evaluation and I recommend that you be seen in a face-to-face visit.   NOTE: There will be NO CHARGE for this E-Visit   If you are having a true medical emergency, please call 911.     For an urgent face to face visit, [place] has multiple urgent care centers for your convenience.  Click the link below for the full list of locations and hours, walk-in wait times, appointment scheduling options and driving directions:  Urgent Care - [place], [place], [place], [place], [place], [place]  [place]     Your MyChart E-visit questionnaire answers were reviewed by a board certified advanced clinical practitioner to complete your personal care plan based on your specific symptoms.    Thank you for using e-Visits.

## 2024-03-08 ENCOUNTER — Telehealth

## 2024-04-17 ENCOUNTER — Encounter
